# Patient Record
Sex: Male | Born: 1935 | Race: White | Hispanic: No | Marital: Married | Smoking: Never smoker
Health system: Southern US, Community
[De-identification: ages and names within clinical notes are randomized; demographics above are authoritative.]

## PROBLEM LIST (undated history)

## (undated) DIAGNOSIS — E785 Hyperlipidemia, unspecified: Secondary | ICD-10-CM

## (undated) DIAGNOSIS — Z951 Presence of aortocoronary bypass graft: Secondary | ICD-10-CM

## (undated) DIAGNOSIS — M1990 Unspecified osteoarthritis, unspecified site: Secondary | ICD-10-CM

## (undated) DIAGNOSIS — I491 Atrial premature depolarization: Secondary | ICD-10-CM

## (undated) DIAGNOSIS — R011 Cardiac murmur, unspecified: Secondary | ICD-10-CM

## (undated) DIAGNOSIS — I4819 Other persistent atrial fibrillation: Secondary | ICD-10-CM

## (undated) DIAGNOSIS — N529 Male erectile dysfunction, unspecified: Secondary | ICD-10-CM

## (undated) DIAGNOSIS — I10 Essential (primary) hypertension: Secondary | ICD-10-CM

## (undated) DIAGNOSIS — N400 Enlarged prostate without lower urinary tract symptoms: Secondary | ICD-10-CM

## (undated) DIAGNOSIS — Z952 Presence of prosthetic heart valve: Secondary | ICD-10-CM

## (undated) DIAGNOSIS — I2510 Atherosclerotic heart disease of native coronary artery without angina pectoris: Secondary | ICD-10-CM

## (undated) DIAGNOSIS — I350 Nonrheumatic aortic (valve) stenosis: Secondary | ICD-10-CM

## (undated) HISTORY — DX: Presence of aortocoronary bypass graft: Z95.1

## (undated) HISTORY — DX: Atrial premature depolarization: I49.1

## (undated) HISTORY — DX: Unspecified osteoarthritis, unspecified site: M19.90

## (undated) HISTORY — DX: Hyperlipidemia, unspecified: E78.5

## (undated) HISTORY — DX: Male erectile dysfunction, unspecified: N52.9

## (undated) HISTORY — PX: TONSILLECTOMY AND ADENOIDECTOMY: SUR1326

## (undated) HISTORY — DX: Essential (primary) hypertension: I10

---

## 1998-11-19 HISTORY — PX: COLONOSCOPY: SHX174

## 2003-11-05 ENCOUNTER — Encounter: Admission: RE | Admit: 2003-11-05 | Discharge: 2003-11-05 | Payer: Self-pay | Admitting: *Deleted

## 2003-12-17 ENCOUNTER — Encounter: Admission: RE | Admit: 2003-12-17 | Discharge: 2003-12-17 | Payer: Self-pay | Admitting: Cardiology

## 2004-01-26 ENCOUNTER — Encounter
Admission: RE | Admit: 2004-01-26 | Discharge: 2004-01-26 | Payer: Self-pay | Admitting: Physical Medicine and Rehabilitation

## 2004-11-03 HISTORY — PX: CARDIOVASCULAR STRESS TEST: SHX262

## 2008-04-19 HISTORY — PX: CORONARY ARTERY BYPASS GRAFT: SHX141

## 2008-05-04 ENCOUNTER — Encounter: Payer: Self-pay | Admitting: Cardiothoracic Surgery

## 2008-05-04 ENCOUNTER — Inpatient Hospital Stay (HOSPITAL_COMMUNITY): Admission: RE | Admit: 2008-05-04 | Discharge: 2008-05-12 | Payer: Self-pay | Admitting: *Deleted

## 2008-05-11 ENCOUNTER — Ambulatory Visit: Payer: Self-pay | Admitting: Cardiothoracic Surgery

## 2008-05-12 DIAGNOSIS — Z951 Presence of aortocoronary bypass graft: Secondary | ICD-10-CM

## 2008-05-12 HISTORY — DX: Presence of aortocoronary bypass graft: Z95.1

## 2008-05-27 ENCOUNTER — Encounter (HOSPITAL_COMMUNITY): Admission: RE | Admit: 2008-05-27 | Discharge: 2008-08-25 | Payer: Self-pay | Admitting: Cardiothoracic Surgery

## 2008-06-03 ENCOUNTER — Encounter: Admission: RE | Admit: 2008-06-03 | Discharge: 2008-06-03 | Payer: Self-pay | Admitting: Cardiothoracic Surgery

## 2008-06-03 ENCOUNTER — Ambulatory Visit: Payer: Self-pay | Admitting: Cardiothoracic Surgery

## 2008-08-26 ENCOUNTER — Encounter (HOSPITAL_COMMUNITY): Admission: RE | Admit: 2008-08-26 | Discharge: 2008-10-28 | Payer: Self-pay | Admitting: Cardiology

## 2008-09-23 ENCOUNTER — Encounter: Admission: RE | Admit: 2008-09-23 | Discharge: 2008-09-23 | Payer: Self-pay | Admitting: Cardiology

## 2008-12-02 IMAGING — CR DG CHEST 2V
2 series · 2 of 2 positions shown · non-contrast
Comparison: 05/10/2008

CLINICAL DATA: Post CABG

CHEST - 2 VIEW

[view not recorded (1 of 2)]
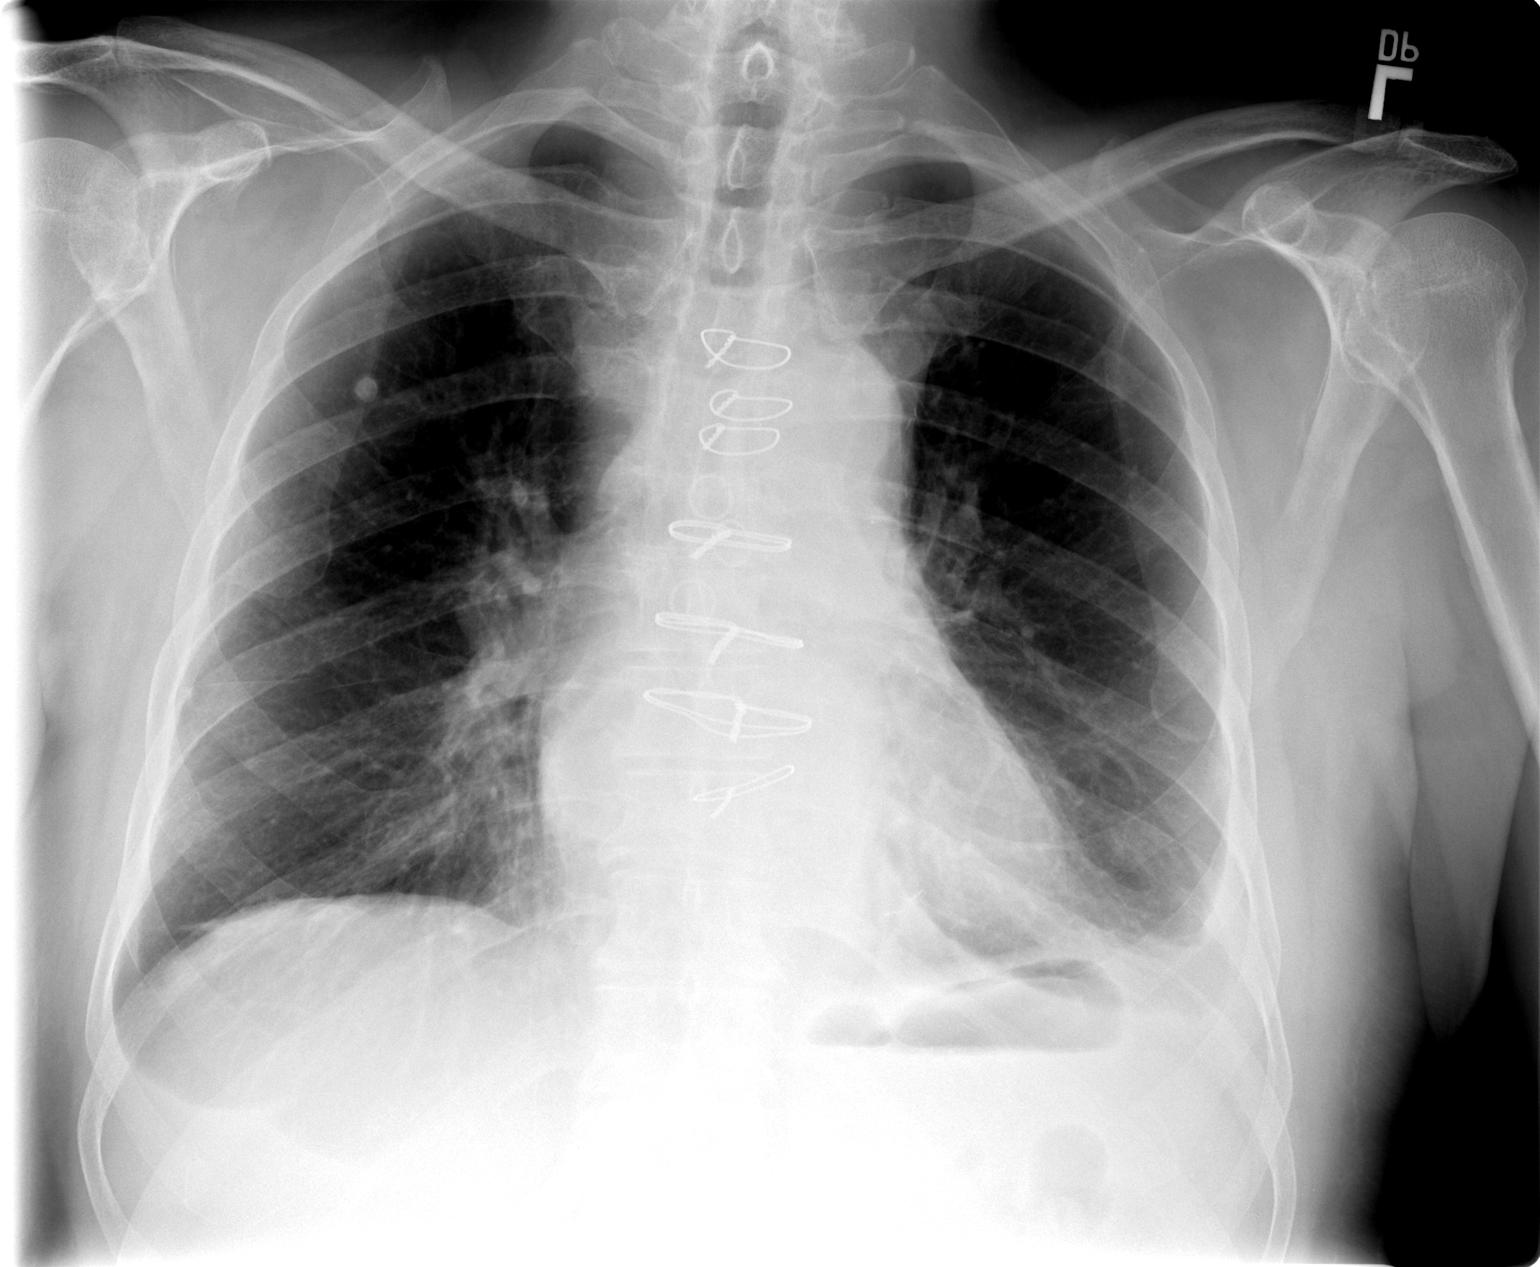

[view not recorded (2 of 2)]
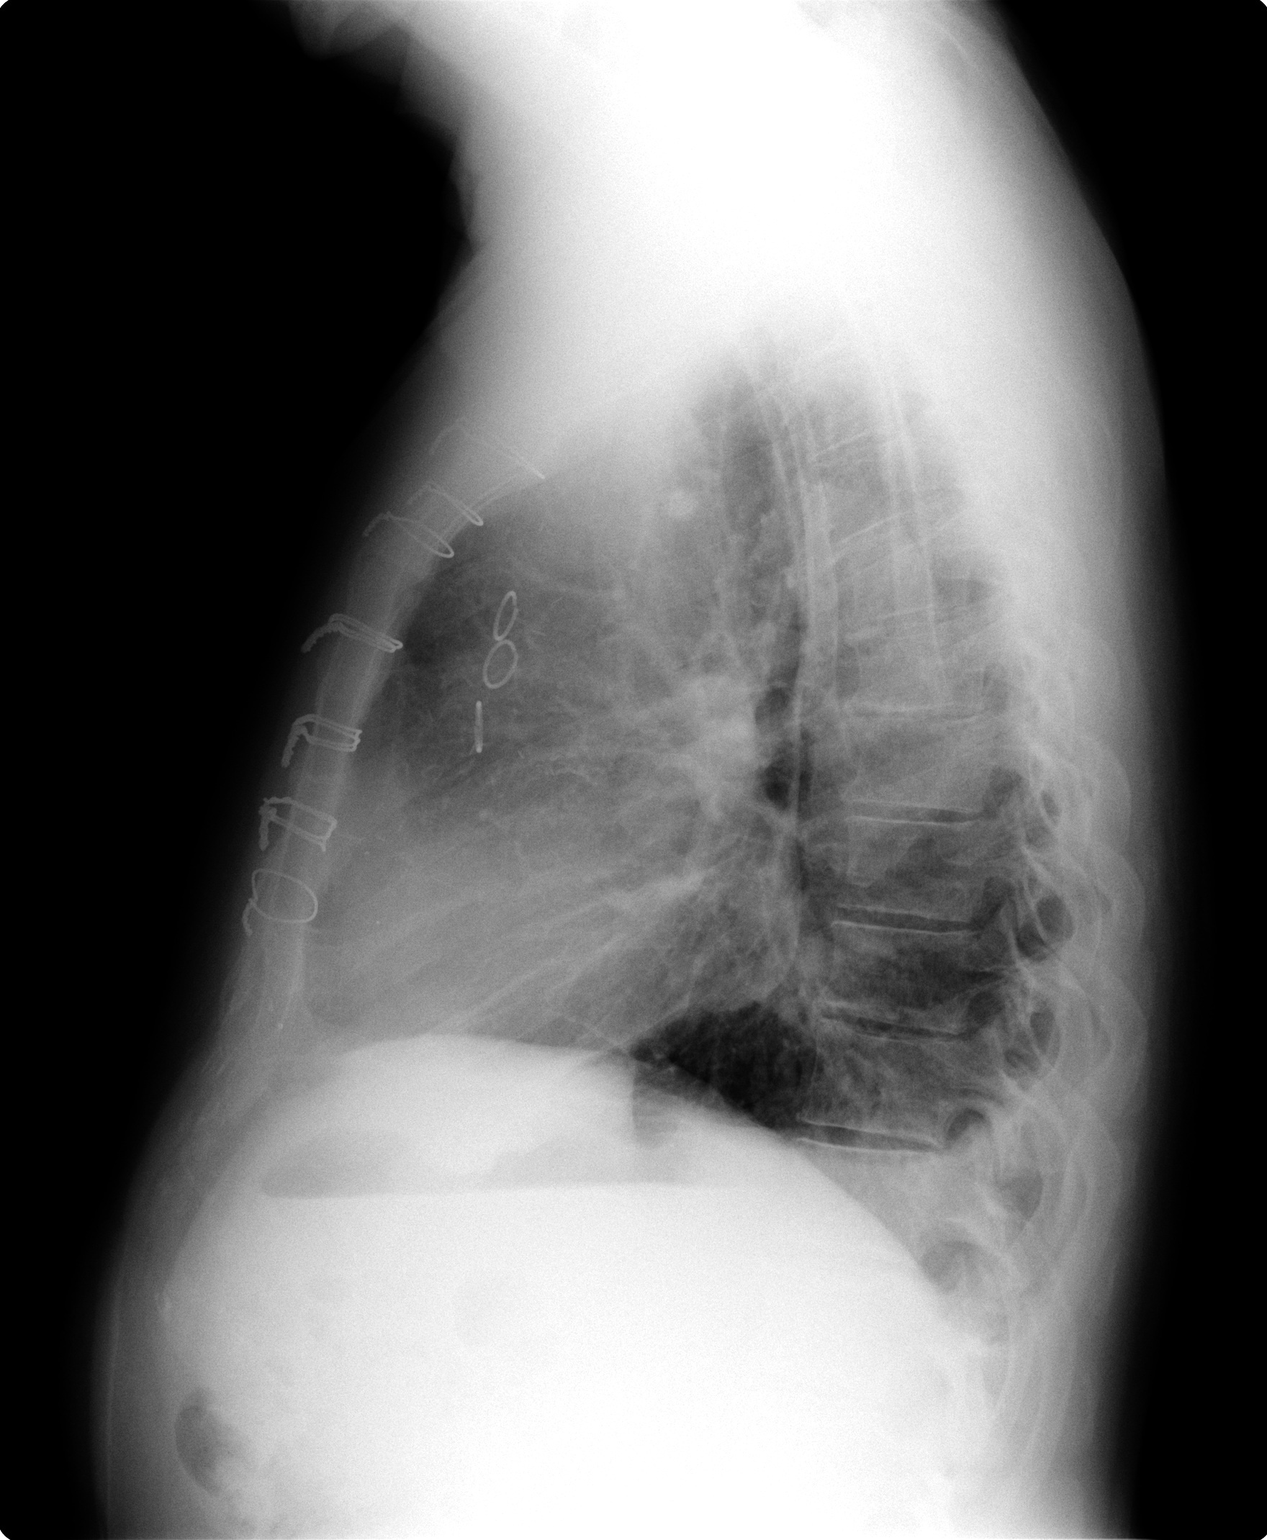

[2 of 2 positions shown; findings below may reference images not displayed]

FINDINGS: Heart smaller with no current vascular congestion.  The
right pleural effusion has essentially resolved.  The left pleural
effusion is smaller and there is less atelectasis at the left lung
base.

Stable right upper lobe calcified granuloma.  Osseous structures
intact.
IMPRESSION: 1.  Decreased pleural effusions with still some residual left
effusion and minimal residual left lower lobe atelectasis.
2.  Overall, significant improvement since the last exam.
3.  No new findings.

## 2010-06-21 ENCOUNTER — Ambulatory Visit: Payer: Self-pay | Admitting: Cardiology

## 2010-06-26 ENCOUNTER — Ambulatory Visit: Payer: Self-pay | Admitting: Cardiology

## 2010-10-20 ENCOUNTER — Ambulatory Visit: Payer: Self-pay | Admitting: Cardiology

## 2010-10-23 ENCOUNTER — Ambulatory Visit: Payer: Self-pay | Admitting: Cardiology

## 2010-12-09 ENCOUNTER — Encounter: Payer: Self-pay | Admitting: Critical Care Medicine

## 2011-04-03 NOTE — H&P (Signed)
Thomas Diaz, Thomas Diaz               ACCOUNT NO.:  [id number]   MEDICAL RECORD NO.:  [id number]            PATIENT TYPE:   LOCATION:                                 FACILITY:   PHYSICIAN:  Elmore Guise., M.D.DATE OF BIRTH:  1932-09-21   DATE OF ADMISSION:  DATE OF DISCHARGE:                              HISTORY & PHYSICAL   PRE-CATH NOTE:   DATE OF CATH:  May 04, 2008.   INDICATION FOR CATH:  Increasing exertional chest pain.   PRIMARY CARDIOLOGIST:  Dr. Ronny Flurry.   HISTORY OF PRESENT ILLNESS:  Mr. Thomas Diaz is a 77 year old white male with  a past medical history of hypertension, dyslipidemia, osteoarthritis,  erectile dysfunction who presents for evaluation of increasing chest  pain, fatigue and shortness of breath.  The patient was initially seen  back on Apr 15, 2008 after he got back from traveling.  At that time, he  reported that he was having increased fatigue.  Typically he was walking  3 miles daily, but doing fairly well.  He awoke that day with some chest  tightness and pressure.  He came in for evaluation.  At that time, we  recommended a stress test.  He deferred his stress test at that time and  wanted to see if his symptoms would just go away on their own.  However,  his symptoms now have progressed.  He now has increasing symptoms on  walking up stairs or a hill.  He describes this as substernal chest  pressure with shortness of breath and some nausea.  He stated he went to  walk with his friends the other day.  He became very diaphoretic and  started having chest pain and shortness of breath.  He had to sit down  before his symptoms would improve.  He did not have to take any  nitroglycerin.  Other than chest pain, he feels pretty good.  He does  have fatigue but no orthopnea, PND.  He does have occasional episodes of  presyncope.  This can happen with or without his chest pain.  He has had  no recent fever, chills, nausea, vomiting or diarrhea and no  claudication symptoms.   CURRENT MEDICATIONS:  1. Lopressor 50 mg in the morning 25 at night.  2. Aspirin 325 mg daily.  3. Glucosamine once daily.  4. Valium 5 mg p.r.n.  5. Cialis p.r.n.  6. Diovan 80 mg daily.  7. Nitroglycerin p.r.n.   ALLERGIES:  None.   FAMILY HISTORY:  Positive for hypertension and heart disease.   SOCIAL HISTORY:  He is married.  He denies any tobacco use.   EXAM:  His weight is 183.  His blood pressure is 140/80.  His heart rate  72 and regular.  GENERAL:  He is a very pleasant white male alert and oriented x4.  No  acute distress.  HEENT is normal.  NECK:  Supple.  No lymphadenopathy.  2+ carotids.  No JVD and no bruits.  LUNGS:  Clear.  Heart is regular with normal S1-S2.  ABDOMEN:  Soft, nontender.  Nondistended.  No rebound or guarding.  EXTREMITIES:  Warm with 2+ pulses and no edema.  Right femoral area has  2+ pulse.  No bruit noted.   His last ECG done Apr 15, 2008 shows normal sinus rhythm rate of 69 per  minute with ST depression noted in his inferior leads.  His blood work  shows a BUN and creatinine of 16 and 1.0, potassium level 4.3.  He has  normal LFTs.  His PT/INR and CBC are still pending at time of dictation.  His chest x-ray shows old granulomatous disease in the right upper lobe  unchanged from his prior x-rays.   IMPRESSION:  1. Increasing exertional angina.  2. Fatigue.  3. History of dyslipidemia.   PLAN:  The patient has had increasing symptoms over the last 3 weeks.  I  would recommend giving a stress test and proceeding directly with heart  catheterization.  I have discussed the risks and benefits of this  procedure with him at length.  His procedure will be scheduled for  tomorrow morning.  All his questions were answered.      Elmore Guise., M.D.  Electronically Signed     TWK/MEDQ  D:  05/03/2008  T:  05/03/2008  Job:  [id number]

## 2011-04-03 NOTE — Assessment & Plan Note (Signed)
OFFICE VISIT   Thomas Diaz, Thomas Diaz  DOB:  20-Mar-1932                                        June 03, 2008  CHART #:  [id number]   The patient returns to the office today in followup after his coronary  artery bypass grafting x5 done on 05/07/2008.  Postoperatively, he  appears to be making good progress.  He does complain of fatigue and  decreased energy levels.  He has had some numbness and discomfort over  the left breast and left nipple, probably related to the dysesthesias  from the use of the left internal mammary artery.  He has had no  recurrent angina or evidence of congestive heart failure.  On exam, his  blood pressure 114/71, pulse 81, respiratory rate is 18, and O2 sats  94%.   Sheliah Plane, MD  Electronically Signed   EG/MEDQ  D:  06/03/2008  T:  06/04/2008  Job:  [id number]   cc:   Cassell Clement, M.D.

## 2011-04-03 NOTE — Op Note (Signed)
Thomas Diaz, Thomas Diaz               ACCOUNT NO.:  [id number]   MEDICAL RECORD NO.:  [id number]          PATIENT TYPE:  INP   LOCATION:  2015                         FACILITY:  [facility]   PHYSICIAN:  Thomas Plane, MD    DATE OF BIRTH:  12-11-1931   DATE OF PROCEDURE:  05/07/2008  DATE OF DISCHARGE:  05/12/2008                               OPERATIVE REPORT   PREOPERATIVE DIAGNOSIS:  Unstable angina.   POSTOPERATIVE DIAGNOSIS:  Unstable angina.   SURGICAL PROCEDURES:  1. Coronary artery bypass grafting x5 with left internal mammary to      the left anterior descending coronary artery.  2. Reverse saphenous vein graft to the diagonal coronary artery.  3. Sequential reverse saphenous vein graft to the intermediate and      first obtuse marginal.  4. Reverse saphenous vein graft to the distal right coronary artery      with right leg thigh and calf endovein harvesting.   SURGEON:  Thomas Plane, MD   FIRST ASSISTANT:  Thomas Diaz. [name], PA   BRIEF HISTORY:  The patient is a 77 year old male who presented with  unstable anginal symptoms progressing over several weeks prior to  admission.  Cardiac catheterization was performed and coronary artery  bypass grafting was recommended.  The patient agreed and signed informed  consent.   DESCRIPTION OF PROCEDURE:  Swan-Ganz and arterial line monitors in  place.  The patient underwent general endotracheal anesthesia without  incidence.  Skin of the chest and legs were prepped with Betadine and  draped in usual sterile manner.  Using the [employer] endovein harvesting  system, vein was harvested from the right thigh and calf and was of good  quality and caliber.  Median sternotomy was performed, left internal  mammary artery was dissected down as pedicle graft.  The distal artery  was divided, had good free flow.  Pericardium was opened.  Overall  ventricular function appeared preserved.  The patient was systemically  heparinized.  The  ascending aorta was cannulated.  The right atrium was  cannulated and aortic root vent cardioplegia needle was introduced into  the ascending aorta.  The patient was placed on cardiopulmonary bypass  2.4 L/min/m2.  Sites of anastomosis were selected and dissected out of  the epicardium.  The patient's body temperature was cooled to 30  degrees.  Aortic crossclamp was applied and 500 mL of cold blood  potassium cardioplegia was administered with rapid diastolic arrest of  the heart.  Myocardial septal temperature was monitored throughout the  crossclamp.  Attention was turned first to the distal right coronary  artery, which was opened and admitted 1.5-mm probe.  By using a running  7-0 Prolene, distal anastomosis was performed.  Attention was then  turned to the intermediate and first obtuse marginal.  The intermediate  was opened 1.4-1.5 mm in size.  Side-to-side anastomosis was performed  with a running 7-0 Prolene.  The distal extent of the same vein was then  carried to the first obtuse marginal vessel.  The very distal circumflex  was high on AV groove and small.  The  obtuse marginal was open measuring  1.5-mm probe.  Using running 7-0 Prolene, distal anastomosis was  performed.  Attention was then turned to the diagonal coronary artery,  which was opened and was 1.3-1.4 mm in size.  Using a running 7-0  Prolene, distal anastomosis was performed with a second reverse  saphenous vein graft.  Between the mid and distal third of the left  anterior descending coronary artery, the vessel was opened and was of  good quality and caliber.  Using a running 8-0 Prolene, the left  internal mammary artery was anastomosed to the left anterior descending  coronary artery with release of the bulldog on the mammary artery.  There was rise in myocardial septal temperature.  An additional bulldog  was placed back on the mammary artery.  With crossclamp still in place,  three punch aortotomies were  performed.  Each of 3 vein grafts were  anastomosed to the ascending aorta.  Air was evacuated from the grafts  and ascending aorta and aortic crossclamp was removed with total  crossclamp time of 89 minutes.  The patient spontaneously converted to a  sinus rhythm.  Sites of anastomosis were inspected and were free of  bleeding.  Using atrial and ventricular pacing, wires were placed.  He  was then ventilated and weaned from cardiopulmonary bypass without  difficulty, remained hemodynamically stable.  He was decannulated in the  usual fashion. Protamine sulfate was administered within operative  field. Hemostatic two atrial, two ventricular pacing wires were applied.  Graft markers applied. The left pleural tube and a Blake mediastinal  drain was left in place.  Pericardium was reapproximated.  Sternum was  closed with #6 stainless steel wire.  Fascia was closed with interrupted  0 Vicryl, running 3-0 Vicryl, subcutaneous tissue, 4-0 subcuticular  stitch in skin edges.  Dry dressings were applied.  Sponge and needle  count was reported as correct at the completion of procedure.  The  patient tolerated procedure without obvious complication.  Total protime  was 112 minutes.      Thomas Plane, MD  Electronically Signed     EG/MEDQ  D:  05/17/2008  T:  05/17/2008  Job:  [id number]   cc:   Thomas Diaz, M.D.

## 2011-04-03 NOTE — Discharge Summary (Signed)
Thomas Diaz, Thomas Diaz               ACCOUNT NO.:  [id number]   MEDICAL RECORD NO.:  [id number]          PATIENT TYPE:  INP   LOCATION:  2015                         FACILITY:  [facility]   PHYSICIAN:  Sheliah Plane, MD    DATE OF BIRTH:  03-03-1932   DATE OF ADMISSION:  05/04/2008  DATE OF DISCHARGE:                               DISCHARGE SUMMARY   FINAL DIAGNOSIS:  Three-vessel coronary artery disease.   IN-HOSPITAL DIAGNOSES:  1. Acute blood-loss anemia postoperatively.  2. Postoperative confusion.  3. Volume overload.   SECONDARY DIAGNOSES:  1. Hypertension.  2. Dyslipidemia.  3. Osteoarthritis.  4. Erectile dysfunction.   IN-HOSPITAL OPERATIONS AND PROCEDURES:  1. Cardiac catheterization.  2. Coronary artery bypass grafting x 5 using a left internal mammary      artery to left anterior descending, saphenous vein graft to right      coronary artery, saphenous vein graft sequential to intermediate      and obtuse marginal, and saphenous vein graft to diagonal.   HISTORY AND PHYSICAL AND HOSPITAL COURSE:  The patient is a 77 year old  male who has no previous cardiac history.  He notes a normal stress test  4 years ago.  Two months ago, while visiting his family, he noted  increasing fatigue and occasional dizziness without syncope.  Over the  past 2 months, he become to have progressively decrease in levels of  activity.  The patient was seen by Dr. Patty Sermons and underwent cardiac  catheterization.  This showed three-vessel coronary artery disease.  Following catheterization, Dr. Tyrone Sage was consulted.  Dr. Tyrone Sage saw  and evaluated the patient.  He discussed with the patient undergoing  coronary bypass grafting.  Risks and benefits discussed.  The patient  acknowledged understanding and agreed to proceed.  Preoperatively, the  patient went bilateral carotid duplex ultrasound showing no significant  ICA stenosis.  The patient remained stable preoperatively.  He was  scheduled for surgery for May 07, 2008.  For details of the patient's  past medical history and physical exam, please see dictated H&P.   The patient was taken to the operating room on May 07, 2008, where he  underwent coronary artery bypass grafting x5 using a left internal  mammary artery to left anterior descending, saphenous vein graft to  right coronary artery, saphenous vein graft to sequential to  intermediate branch and obtuse marginal, and saphenous vein graft to  diagonal artery.  The patient tolerated this procedure and was  transferred to the intensive care unit in a stable condition.  Postoperatively, the patient was noted to be hemodynamically stable.  He  was extubated evening of surgery.  Postextubation, he was noted to be  alert and oriented x4.  Neuro intact.  Postoperatively, the patient did  require neo and dopamine.  These were able to be weaned and  discontinued; however, his blood pressure remained stable.  The patient  remained in normal sinus rhythm postoperatively.  He was restarted on  Lopressor.  The patient's blood pressure monitoring remained stable.  External pacing wires were discontinued in normal fashion.  Chest x-ray  obtained postoperatively was clear.  Chest tubes were discontinued in  normal fashion.  The patient was encouraged to use his incentive  spirometer and able to be weaned off oxygen, satting greater than 90% on  room air.  While the patient was in the intensive care unit, he did  develop mild confusion.  All narcotics were discontinued.  The patient  noted to be on chronic Valium as needed at home.  He was monitored, and  confusion did improve, and he was transferred out to the PCTU alert and  oriented x4 and in a stable condition.  Pain was maintained on Ultram  p.o.  The patient did have mild acute blood-loss anemia.  Hemoglobin/hematocrit dropping to 8.6 and 24.9% on postop day #3.  He  was asymptomatic and monitored.  We will recheck  his CBC in the a.m. on  May 12, 2008.  He was started on p.o. iron.  The patient also had mild  volume overload postoperatively.  He was started on diuretics.  Daily  weights obtained.  The patient is currently 6.4 kg above his  preoperative weight.  Plan to continue diuretics as an outpatient.  The  patient was out of bed ambulating well with cardiac rehab.  He was  tolerating diet well.  No nausea or vomiting noted.  All incisions were  clean, dry, and intact and healing well.   On postop day #4, the patient was noted to be afebrile.  He was in  normal sinus rhythm.  Blood pressure stable.  Sats greater than 90% on  room air.  BMP done postop day #4 showed a sodium of 142, potassium 3.5,  chloride of 108, bicarb of 28, BUN 14, creatinine of 0.98, and glucose  of 119.  The patient had CBC done a day prior showing white count 11.7,  hemoglobin of 9.8, hematocrit 24.9, and glucose of 94.  I will check his  CBC in the a.m. prior to discharge to home.  The patient is tentatively  ready for discharge home on postop day #5, May 12, 2008.  This  depending he remains stable.   FOLLOWUP APPOINTMENT:  Followup appointment has been arranged with Dr.  Tyrone Sage for June 03, 2008, at 10:30 a.m..  The patient will need to  obtain PA and lateral chest x-ray 30 minutes prior to this appointment.  The patient will need to follow up Dr. Patty Sermons in 2 weeks.  He will  need to contact Dr. Yevonne Pax office to make these arrangements.   ACTIVITY:  Patient instructed no driving, he agrees to do so, no lifting  over 10 pounds.  He is told to ambulate 3/4 times per day and progress  as tolerated and continue his breathing exercises.   INCISIONAL CARE:  The patient was told to shower washing his incisions  using soap and water.  He is to contact the office if he develops any  drainage or opening from any of his incision sites.   DISCHARGE MEDICATIONS:  1. Lopressor 50 mg in the a.m.  2. Lopressor 25 mg  in the p.m.  3. Aspirin 325 mg daily.  4,  Valium 5 mg p.r.n.  5,  Crestor 20 mg at night.  1. Lasix 40 mg daily x5 days.  2. Potassium chloride 20 mEq daily x5 days.  3. Niferex 150 mg daily.  4. Multivitamin daily.  5. Folic acid 1 mg daily.  6. Ultram 50 mg 1-2 tablets q.4-6 h. p.r.n.  Theda Belfast, [place]      Sheliah Plane, MD  Electronically Signed    KMD/MEDQ  D:  05/11/2008  T:  05/12/2008  Job:  [id number]   cc:   Cassell Clement, M.D.

## 2011-04-03 NOTE — Assessment & Plan Note (Signed)
OFFICE VISIT   Thomas Diaz, Thomas Diaz  DOB:  25-Dec-1931                                        June 03, 2008  CHART #:  [id number]   The patient returns to the office today in followup after his coronary  artery bypass grafting x5 done on 05/07/2008.  He seems to be making  good progress postoperatively.  He does complain of fatigue and left  chest tenderness and hypoesthesias probably related to use of the left  internal mammary artery.  He has had no recurrent angina or evidence of  congestive heart failure.   On exam, his blood pressure is 114/71, pulse is 81, respiratory rate is  18, and O2 sats 94%.  His sternum is stable and well healed.  His lungs  are clear bilaterally.  His right endovein harvest site is also healing  well.  He has no pedal edema.   Followup chest x-ray shows clear lung fields bilaterally.   Overall, he seems to be making very good progress postoperatively.  I  have encouraged him to continue with the cardiac rehab program which he  is to start tomorrow.  I will plan to see him back p.r.n. at his or Dr.  Yevonne Pax request any time.   Sheliah Plane, MD  Electronically Signed   EG/MEDQ  D:  06/03/2008  T:  06/04/2008  Job:  [id number]   cc:   Cassell Clement, M.D.

## 2011-04-03 NOTE — Consult Note (Signed)
Thomas Diaz, Thomas Diaz NO.:  [id number]   MEDICAL RECORD NO.:  [id number]          PATIENT TYPE:  OIB   LOCATION:  3743                         FACILITY:  [facility]   PHYSICIAN:  Sheliah Plane, MD    DATE OF BIRTH:  04/15/1932   DATE OF CONSULTATION:  05/04/2008  DATE OF DISCHARGE:                                 CONSULTATION   REQUESTING PHYSICIAN:  Rosine Abe, M.D.   FOLLOWUP CARDIOLOGIST:  Cassell Clement, M.D.   REASON FOR CONSULTATION:  Unstable angina with coronary occlusive  disease.   HISTORY OF PRESENT ILLNESS:  The patient is a 77 year old male who has  no previous cardiac history.  He notes a normal stress test 4 years ago.  Two months ago while visiting his family in [place] state, he noted  increasing fatigue and occasional dizziness, but without syncope.  Over  the past 2 months, the symptoms have become progressive with decreasing  levels of activity.  He noted last week increasing substernal discomfort  with shortness of breath with pain radiating to his gums.  Initially, it  took about 2 miles of walking to do this until late last week when it  occurred with just walking 1 block.  He saw Dr. Patty Sermons, and cardiac  catheterization was recommend and done as an outpatient today.  The  patient has had no previous history of myocardial infarction or  angioplasty.  Cardiac risk factors include hypertension, hyperlipidemia,  and family history.  He has had no previous stroke, no claudication, and  no renal insufficiency.   PAST MEDICAL HISTORY:  1. Hypertension, treated.  2. Hyperlipidemia.  3. Erectile dysfunction.   SOCIAL AND FAMILY HISTORY:  The patient is married.  He has been retired  from [employer] x 17 years.  Very rare alcohol use.  The patient's  father died at age 52 suddenly in his sleep, unknown cause.  Mother died  at age 60 with hypertension, one brother recently died at age 64 with  myocardial infarction.   CURRENT  MEDICATIONS:  1. Lopressor 50 mg q.a.m. and 25 q.p.m.  2. Aspirin 325 mg a day.  3. Cialis.  4. Diovan 80 mg.  5. Glucosamine.  6. Nitroglycerin p.r.n.  7. Lipitor was added today.   DRUG ALLERGIES:  None known, though the patient noted several years ago,  he took Lipitor for 3 months and became fatigued and discontinued it.   REVIEW OF SYSTEMS:  GENERAL:  The patient notes increasing fatigue.  Denies fever, chills, or night sweats.  CARDIAC:  Positive for  exertional chest pain, exertional shortness of breath, rare palpitations  in the past, none currently, and presyncope.  Denies syncope, orthopnea,  or lower extremity edema.  RESPIRATORY:  The patient denies any  hemoptysis.  He does have exertional shortness of breath.  GASTROINTESTINAL:  No change in bowel habits, no blood in stool, noted a  colonoscopy about 3 years ago.  MUSCULOSKELETAL:  He has had knee  problems in the past, but that has not interfered with his ambulation.  He does have nocturia x1.  No hematuria.  Denies psychiatric history.   PHYSICAL EXAMINATION:  VITAL SIGNS:  Today, his blood pressure is  152/95, heart rate 63, respiratory rate 20, and temperature 97.4.  He is  on room air 100%.  He is 5 feet 10 inches tall and 178 pounds.  GENERAL:  The patient is awake, alert and neurologically intact.  NECK:  He has no carotid bruits.  LUNGS:  Clear bilaterally.  CARDIAC:  Regular rate and rhythm without murmur or gallop.  ABDOMEN:  Without palpable masses or tenderness.  LOWER EXTREMITIES:  Without any edema.  He has got 2+ DP and PT pulses  bilaterally.   LABORATORY FINDINGS:  Include a white count of 7.1, hematocrit of 47,  platelet count 197, sodium 144, potassium 4.4, and creatinine 1.0.  Cardiac catheterization, ejection fraction 60%.  He has got 60% distal  left main extended into ostium of LAD, a small diagonal branch with an  80% stenosis, luminal irregularities in the circumflex, the right  coronary  artery is a small vessel with proximal to mid 95% stenosis.   IMPRESSION:  The patient with unstable anginal symptoms without history  of myocardial infarction who presents now with probably symptomatic  right coronary lesion with left anterior descending and left main  obstructions of 60%.  The risks and options were discussed with the  patient with the right coronary artery could undergo angioplasty;  however, this still leaves the patient with a left main obstruction of  at least 60% involving the left anterior descending.  He is otherwise in  good health and coronary artery bypass grafting would be the best  solution for relief of symptoms and preservation of left ventricular  function.  The risks of procedure including death, infection, stroke,  myocardial infarction, bleeding, and blood transfusion were all  discussed with the patient in detail and he is willing to agree.  He has  had a specific request for surgeon, current schedule allows, we could  proceed with surgery on Friday, May 07, 2008.      Sheliah Plane, MD  Electronically Signed     EG/MEDQ  D:  05/04/2008  T:  05/05/2008  Job:  [id number]   cc:   Cassell Clement, M.D.

## 2011-05-21 ENCOUNTER — Encounter: Payer: Self-pay | Admitting: Cardiology

## 2011-05-25 ENCOUNTER — Other Ambulatory Visit: Payer: Self-pay | Admitting: *Deleted

## 2011-05-25 DIAGNOSIS — E785 Hyperlipidemia, unspecified: Secondary | ICD-10-CM

## 2011-05-28 ENCOUNTER — Other Ambulatory Visit (INDEPENDENT_AMBULATORY_CARE_PROVIDER_SITE_OTHER): Payer: Medicare Other | Admitting: *Deleted

## 2011-05-28 DIAGNOSIS — M109 Gout, unspecified: Secondary | ICD-10-CM

## 2011-05-28 DIAGNOSIS — E785 Hyperlipidemia, unspecified: Secondary | ICD-10-CM

## 2011-05-28 LAB — BASIC METABOLIC PANEL
BUN: 16 mg/dL (ref 6–23)
Calcium: 8.9 mg/dL (ref 8.4–10.5)
GFR: 65.49 mL/min (ref >60.00)
Glucose, Bld: 105 mg/dL — ABNORMAL HIGH (ref 70–99)
Potassium: 4.1 mEq/L (ref 3.5–5.1)

## 2011-05-28 LAB — HEPATIC FUNCTION PANEL
AST: 24 U/L (ref 0–37)
Albumin: 4.4 g/dL (ref 3.5–5.2)

## 2011-05-28 LAB — LIPID PANEL
Cholesterol: 151 mg/dL (ref 0–200)
VLDL: 24.2 mg/dL (ref 0.0–40.0)

## 2011-05-30 ENCOUNTER — Encounter: Payer: Self-pay | Admitting: Cardiology

## 2011-05-30 ENCOUNTER — Ambulatory Visit (INDEPENDENT_AMBULATORY_CARE_PROVIDER_SITE_OTHER): Payer: Medicare Other | Admitting: Cardiology

## 2011-05-30 DIAGNOSIS — Z9889 Other specified postprocedural states: Secondary | ICD-10-CM

## 2011-05-30 DIAGNOSIS — I119 Hypertensive heart disease without heart failure: Secondary | ICD-10-CM | POA: Insufficient documentation

## 2011-05-30 DIAGNOSIS — Z951 Presence of aortocoronary bypass graft: Secondary | ICD-10-CM

## 2011-05-30 DIAGNOSIS — N400 Enlarged prostate without lower urinary tract symptoms: Secondary | ICD-10-CM

## 2011-05-30 DIAGNOSIS — M109 Gout, unspecified: Secondary | ICD-10-CM

## 2011-05-30 DIAGNOSIS — N529 Male erectile dysfunction, unspecified: Secondary | ICD-10-CM

## 2011-05-30 HISTORY — DX: Benign prostatic hyperplasia without lower urinary tract symptoms: N40.0

## 2011-05-30 MED ORDER — ALLOPURINOL 100 MG PO TABS: 100.0000 mg | ORAL_TABLET | Freq: Every day | ORAL | Status: DC

## 2011-05-30 MED ORDER — ASPIRIN EC 81 MG PO TBEC: 81.0000 mg | DELAYED_RELEASE_TABLET | Freq: Every day | ORAL | Status: AC

## 2011-05-30 MED ORDER — TADALAFIL 20 MG PO TABS: 20.0000 mg | ORAL_TABLET | Freq: Every day | ORAL | Status: DC | PRN

## 2011-05-30 MED ORDER — SILODOSIN 8 MG PO CAPS: 8.0000 mg | ORAL_CAPSULE | Freq: Every day | ORAL | Status: DC

## 2011-05-30 NOTE — Assessment & Plan Note (Signed)
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  He is concerned that he will have an attack of gout while he is on a long ship cruise.His episodes of acute gout are very infrequent.  His recent uric acid is upper limit at 7.8.  We will have him take allopurinol starting a month before his cruise and during the cruise to help make sure that he doesn't have an acute attack during the cruise.  We will also reduce his aspirin from 325 mg to 81 mg and we reminded him about drinking plenty of water

## 2011-05-30 NOTE — Assessment & Plan Note (Signed)
Patient has a history of essential hypertension.  He has not been having any headaches or dizzy spells or other cardiac symptoms referable to his blood pressure.

## 2011-05-30 NOTE — Progress Notes (Signed)
Thomas Diaz Date of Birth:  05-Aug-1932 [facility] [address]   [address] [place], [place]  [address] [phone]           Fax   [phone]  HPI: This pleasant 77 year old gentleman is seen for a scheduled six-month followup office visit.  He has been feeling well from a cardiac standpoint.  He has a past history of ischemic heart disease and underwent coronary artery bypass graft surgery in June of 2009.  Since then he has done well with no recurrent chest discomfort.  He walks daily for exercise.  He has a history of essential hypertension.  He's also had mild hypercholesterolemia and he is on statin therapy.  He's had some urinary issues and has seen Dr. Shiela Mayer who put him on rapaflo which has improved his situation.  He has a history of erectile dysfunction and is on p.r.n. Cialis 20 mg tablet.  He's had several flareups of gout.  His most recent uric acid level is 7.8  Current Outpatient Prescriptions  Medication Sig Dispense Refill  . allopurinol (ZYLOPRIM) 100 MG tablet Take 1 tablet (100 mg total) by mouth daily.  30 tablet  2  . diazepam (VALIUM) 5 MG tablet Take 5 mg by mouth every 6 (six) hours as needed.        . metoprolol (LOPRESSOR) 50 MG tablet Take 50 mg by mouth 2 (two) times daily.        . Multiple Vitamin (MULTIVITAMIN) tablet Take 1 tablet by mouth daily.        . nitroGLYCERIN (NITROSTAT) 0.4 MG SL tablet Place 0.4 mg under the tongue every 5 (five) minutes as needed.        . rosuvastatin (CRESTOR) 5 MG tablet Take 5 mg by mouth every other day. Three times a week      . tadalafil (CIALIS) 20 MG tablet Take 1 tablet (20 mg total) by mouth daily as needed.  18 tablet  3  . aspirin EC 81 MG tablet Take 1 tablet (81 mg total) by mouth daily.  150 tablet  2  . silodosin (RAPAFLO) 8 MG CAPS capsule Take 1 capsule (8 mg total) by mouth daily with breakfast.  30 capsule  11    Allergies  Allergen Reactions  . Halcion (Triazolam)   .  Lipitor (Atorvastatin Calcium)   . Lisinopril Cough    Patient Active Problem List  Diagnoses  . Hx of CABG  . Gout  . BPH (benign prostatic hyperplasia)  . Erectile dysfunction  . Benign hypertensive heart disease without heart failure    History  Smoking status  . Never Smoker   Smokeless tobacco  . Not on file    History  Alcohol Use No    Family History  Problem Relation Age of Onset  . Heart attack Father   . Heart attack Brother     Review of Systems: The patient denies any heat or cold intolerance.  No weight gain or weight loss.  The patient denies headaches or blurry vision.  There is no cough or sputum production.  The patient denies dizziness.  There is no hematuria or hematochezia.  The patient denies any muscle aches or arthritis.  The patient denies any rash.  The patient denies frequent falling or instability.  There is no history of depression or anxiety.  All other systems were reviewed and are negative.   Physical Exam: Filed Vitals:   05/30/11 1013  BP:  132/80  Pulse: 64  The general appearance reveals a well-developed well-nourished gentleman in no distress.The head and neck exam reveals pupils equal and reactive.  Extraocular movements are full.  There is no scleral icterus.  The mouth and pharynx are normal.  The neck is supple.  The carotids reveal no bruits.  The jugular venous pressure is normal.  The  thyroid is not enlarged.  There is no lymphadenopathy.  The chest is clear to percussion and auscultation.  There are no rales or rhonchi.  Expansion of the chest is symmetrical.  The precordium is quiet.  The first heart sound is normal.  The second heart sound is physiologically split.  There is no murmur gallop rub or click.  There is no abnormal lift or heave.  The abdomen is soft and nontender.  The bowel sounds are normal.  The liver and spleen are not enlarged.  There are no abdominal masses.  There are no abdominal bruits.  Extremities reveal good  pedal pulses.  There is no phlebitis or edema.  There is no cyanosis or clubbing.  Strength is normal and symmetrical in all extremities.  There is no lateralizing weakness.  There are no sensory deficits.  The skin is warm and dry.  There is no rash.    Assessment / Plan: We are adding allopurinol 100 mg daily to be used before injuring his annual oceangoing cruise vacation.  He has a flareup of acute gout he will use ibuprofen or Aleve as necessary.  We will reduce his aspirin to 81 mg daily to decrease the likelihood of acute gout.  He is to force fluids especially water.  He'll return in 6 months for followup office visit and fasting lab work and uric acid

## 2011-05-30 NOTE — Assessment & Plan Note (Signed)
The patient has a past history of BPH.  He sees a urologist.  He has difficulty with erectile dysfunction and also a weak stream.  The weak stream has improved on Rapaflo.

## 2011-05-30 NOTE — Assessment & Plan Note (Signed)
The patient has a history of CABG in June 2009.  He's had no recurrent chest pain or angina.

## 2011-08-16 ENCOUNTER — Other Ambulatory Visit: Payer: Self-pay | Admitting: *Deleted

## 2011-08-16 DIAGNOSIS — F419 Anxiety disorder, unspecified: Secondary | ICD-10-CM

## 2011-08-16 LAB — BASIC METABOLIC PANEL
BUN: 14
BUN: 14
BUN: 15
BUN: 15
BUN: 18
BUN: 9
CO2: 24
CO2: 25
CO2: 25
CO2: 27
CO2: 27
CO2: 28
Calcium: 7.5 — ABNORMAL LOW
Calcium: 7.9 — ABNORMAL LOW
Calcium: 8.1 — ABNORMAL LOW
Calcium: 8.8
Chloride: 104
Chloride: 105
Chloride: 106
Chloride: 107
Chloride: 107
Chloride: 108
Chloride: 108
Creatinine, Ser: 0.94
Creatinine, Ser: 0.95
Creatinine, Ser: 0.98
Creatinine, Ser: 0.99
Creatinine, Ser: 1.01
Creatinine, Ser: 1.06
GFR calc Af Amer: >60
GFR calc Af Amer: >60
GFR calc Af Amer: >60
GFR calc Af Amer: >60
GFR calc Af Amer: >60
GFR calc non Af Amer: >60
GFR calc non Af Amer: >60
GFR calc non Af Amer: >60
GFR calc non Af Amer: >60
Glucose, Bld: 105 — ABNORMAL HIGH
Glucose, Bld: 118 — ABNORMAL HIGH
Glucose, Bld: 119 — ABNORMAL HIGH
Glucose, Bld: 175 — ABNORMAL HIGH
Glucose, Bld: 93
Potassium: 3.4 — ABNORMAL LOW
Potassium: 3.5
Potassium: 3.5
Potassium: 3.6
Potassium: 3.6
Potassium: 3.9
Potassium: 4.5
Sodium: 137
Sodium: 138
Sodium: 140
Sodium: 141
Sodium: 142

## 2011-08-16 LAB — CBC
HCT: 24.9 — ABNORMAL LOW
HCT: 25.8 — ABNORMAL LOW
HCT: 26.1 — ABNORMAL LOW
HCT: 26.6 — ABNORMAL LOW
HCT: 27.2 — ABNORMAL LOW
HCT: 30.3 — ABNORMAL LOW
HCT: 34.8 — ABNORMAL LOW
HCT: 43.4
HCT: 45
Hemoglobin: 10.6 — ABNORMAL LOW
Hemoglobin: 12.3 — ABNORMAL LOW
Hemoglobin: 15
Hemoglobin: 15.6
Hemoglobin: 9 — ABNORMAL LOW
Hemoglobin: 9 — ABNORMAL LOW
Hemoglobin: 9.2 — ABNORMAL LOW
Hemoglobin: 9.3 — ABNORMAL LOW
MCHC: 34.3
MCHC: 34.3
MCHC: 34.5
MCHC: 34.6
MCHC: 34.7
MCHC: 34.8
MCHC: 35
MCHC: 35.3
MCV: 89.2
MCV: 89.3
MCV: 89.8
MCV: 90.2
MCV: 90.5
MCV: 90.5
MCV: 90.5
MCV: 90.5
MCV: 91.3
Platelets: 103 — ABNORMAL LOW
Platelets: 104 — ABNORMAL LOW
Platelets: 109 — ABNORMAL LOW
Platelets: 166
Platelets: 167
Platelets: 86 — ABNORMAL LOW
Platelets: 93 — ABNORMAL LOW
Platelets: 94 — ABNORMAL LOW
RBC: 2.89 — ABNORMAL LOW
RBC: 2.89 — ABNORMAL LOW
RBC: 2.94 — ABNORMAL LOW
RBC: 3.01 — ABNORMAL LOW
RBC: 3.4 — ABNORMAL LOW
RBC: 3.84 — ABNORMAL LOW
RBC: 4.81
RBC: 5.02
RDW: 12.7
RDW: 12.8
RDW: 12.9
RDW: 12.9
RDW: 13
RDW: 13.1
RDW: 13.2
WBC: 10.4
WBC: 10.5
WBC: 11.1 — ABNORMAL HIGH
WBC: 12.3 — ABNORMAL HIGH
WBC: 13.7 — ABNORMAL HIGH
WBC: 8.2
WBC: 8.7

## 2011-08-16 LAB — MAGNESIUM
Magnesium: 2.3
Magnesium: 2.4
Magnesium: 2.7 — ABNORMAL HIGH

## 2011-08-16 LAB — POCT I-STAT 3, ART BLOOD GAS (G3+)
Acid-base deficit: 3 — ABNORMAL HIGH
Acid-base deficit: 3 — ABNORMAL HIGH
Bicarbonate: 22.7
Bicarbonate: 23.2
Bicarbonate: 24.6 — ABNORMAL HIGH
O2 Saturation: 100
Operator id: 203371
Operator id: 298181
Operator id: 3390
Patient temperature: 30
Patient temperature: 36.8
pCO2 arterial: 30.5 — ABNORMAL LOW
pH, Arterial: 7.446
pH, Arterial: 7.571 — ABNORMAL HIGH
pO2, Arterial: 300 — ABNORMAL HIGH
pO2, Arterial: 315 — ABNORMAL HIGH
pO2, Arterial: 54 — ABNORMAL LOW
pO2, Arterial: 73 — ABNORMAL LOW

## 2011-08-16 LAB — URINALYSIS, ROUTINE W REFLEX MICROSCOPIC
Bilirubin Urine: NEGATIVE
Glucose, UA: NEGATIVE
Hgb urine dipstick: NEGATIVE
Ketones, ur: NEGATIVE
Nitrite: NEGATIVE
Protein, ur: NEGATIVE
Specific Gravity, Urine: 1.009
Urobilinogen, UA: 0.2
pH: 6.5

## 2011-08-16 LAB — POCT I-STAT 4, (NA,K, GLUC, HGB,HCT)
Glucose, Bld: 102 — ABNORMAL HIGH
Glucose, Bld: 103 — ABNORMAL HIGH
Glucose, Bld: 90
Hemoglobin: 8.5 — ABNORMAL LOW
Hemoglobin: 8.5 — ABNORMAL LOW
Operator id: 298181
Operator id: 3390
Potassium: 3.6
Potassium: 4.6
Potassium: 6.2 — ABNORMAL HIGH
Sodium: 133 — ABNORMAL LOW
Sodium: 135

## 2011-08-16 LAB — POCT I-STAT GLUCOSE
Glucose, Bld: 97
Operator id: 3390

## 2011-08-16 LAB — BLOOD GAS, ARTERIAL
Acid-Base Excess: 0.9
Bicarbonate: 24.8 — ABNORMAL HIGH
Drawn by: 276051
FIO2: 0.21
O2 Saturation: 94.2
Patient temperature: 98.6
TCO2: 25.9
pCO2 arterial: 38
pH, Arterial: 7.43
pO2, Arterial: 69.6 — ABNORMAL LOW

## 2011-08-16 LAB — COMPREHENSIVE METABOLIC PANEL
CO2: 29
Calcium: 8.8
Chloride: 105
Creatinine, Ser: 1.03
GFR calc non Af Amer: >60
Glucose, Bld: 99
Total Bilirubin: 1.8 — ABNORMAL HIGH

## 2011-08-16 LAB — HEMOGLOBIN A1C: Mean Plasma Glucose: 129

## 2011-08-16 LAB — PROTIME-INR
INR: 1
INR: 1.7 — ABNORMAL HIGH
Prothrombin Time: 13.6
Prothrombin Time: 20.2 — ABNORMAL HIGH

## 2011-08-16 LAB — HEMOGLOBIN AND HEMATOCRIT, BLOOD: HCT: 25.6 — ABNORMAL LOW

## 2011-08-16 LAB — HIGH SENSITIVITY CRP: CRP, High Sensitivity: 0.3

## 2011-08-16 LAB — TSH: TSH: 0.391

## 2011-08-16 LAB — PLATELET COUNT: Platelets: 99 — ABNORMAL LOW

## 2011-08-16 LAB — POCT I-STAT 3, VENOUS BLOOD GAS (G3P V)
O2 Saturation: 89
Operator id: 3390
pCO2, Ven: 24.7 — ABNORMAL LOW
pO2, Ven: 35

## 2011-08-16 LAB — LIPID PANEL
Cholesterol: 158
Total CHOL/HDL Ratio: 4.2
VLDL: 18

## 2011-08-16 LAB — POCT I-STAT, CHEM 8
Chloride: 103
Chloride: 106
Glucose, Bld: 170 — ABNORMAL HIGH
HCT: 27 — ABNORMAL LOW
HCT: 30 — ABNORMAL LOW
Potassium: 4.1
Potassium: 4.2
Sodium: 139

## 2011-08-16 LAB — CREATININE, SERUM
Creatinine, Ser: 0.93
Creatinine, Ser: 1.2
GFR calc Af Amer: >60
GFR calc Af Amer: >60
GFR calc non Af Amer: 59 — ABNORMAL LOW
GFR calc non Af Amer: >60

## 2011-08-16 LAB — APTT
aPTT: 31
aPTT: 40 — ABNORMAL HIGH

## 2011-08-16 LAB — T4: T4, Total: 7.1

## 2011-08-16 LAB — TYPE AND SCREEN
ABO/RH(D): A NEG
Antibody Screen: NEGATIVE

## 2011-08-16 MED ORDER — DIAZEPAM 5 MG PO TABS: ORAL_TABLET | ORAL | Status: DC

## 2011-08-16 NOTE — Telephone Encounter (Signed)
Refilled diazepam 5mg .  One time only

## 2011-10-17 ENCOUNTER — Other Ambulatory Visit: Payer: Self-pay | Admitting: *Deleted

## 2011-10-17 ENCOUNTER — Telehealth: Payer: Self-pay | Admitting: Cardiology

## 2011-10-17 DIAGNOSIS — E7800 Pure hypercholesterolemia, unspecified: Secondary | ICD-10-CM

## 2011-10-17 NOTE — Telephone Encounter (Signed)
Put in orders for labs and schedule

## 2011-10-17 NOTE — Telephone Encounter (Signed)
New Msg: Pt calling to schedule 1 yr FU with [facility] and wanted to schedule lab work 2 days prior to pt appt on 12/24/11. Please put order for pt blood work in.

## 2011-11-03 ENCOUNTER — Other Ambulatory Visit: Payer: Self-pay | Admitting: Cardiology

## 2011-12-24 ENCOUNTER — Other Ambulatory Visit (INDEPENDENT_AMBULATORY_CARE_PROVIDER_SITE_OTHER): Payer: Medicare Other | Admitting: *Deleted

## 2011-12-24 DIAGNOSIS — E7800 Pure hypercholesterolemia, unspecified: Secondary | ICD-10-CM

## 2011-12-24 DIAGNOSIS — M109 Gout, unspecified: Secondary | ICD-10-CM

## 2011-12-24 LAB — LIPID PANEL
HDL: 52.7 mg/dL (ref >39.00)
LDL Cholesterol: 69 mg/dL (ref 0–99)
VLDL: 11.8 mg/dL (ref 0.0–40.0)

## 2011-12-24 LAB — HEPATIC FUNCTION PANEL
Alkaline Phosphatase: 51 U/L (ref 39–117)
Bilirubin, Direct: 0.1 mg/dL (ref 0.0–0.3)
Total Bilirubin: 1 mg/dL (ref 0.3–1.2)
Total Protein: 6.8 g/dL (ref 6.0–8.3)

## 2011-12-24 LAB — BASIC METABOLIC PANEL
BUN: 22 mg/dL (ref 6–23)
Chloride: 107 mEq/L (ref 96–112)
Creatinine, Ser: 1 mg/dL (ref 0.4–1.5)
Glucose, Bld: 101 mg/dL — ABNORMAL HIGH (ref 70–99)
Potassium: 3.7 mEq/L (ref 3.5–5.1)

## 2011-12-24 LAB — URIC ACID: Uric Acid, Serum: 6.7 mg/dL (ref 4.0–7.8)

## 2011-12-26 ENCOUNTER — Other Ambulatory Visit: Payer: Medicare Other | Admitting: *Deleted

## 2011-12-26 ENCOUNTER — Ambulatory Visit (INDEPENDENT_AMBULATORY_CARE_PROVIDER_SITE_OTHER): Payer: Medicare Other | Admitting: Cardiology

## 2011-12-26 ENCOUNTER — Encounter: Payer: Self-pay | Admitting: Cardiology

## 2011-12-26 VITALS — BP 120/78 | HR 60 | Ht 70.0 in | Wt 184.0 lb

## 2011-12-26 DIAGNOSIS — E7800 Pure hypercholesterolemia, unspecified: Secondary | ICD-10-CM

## 2011-12-26 DIAGNOSIS — Z951 Presence of aortocoronary bypass graft: Secondary | ICD-10-CM

## 2011-12-26 DIAGNOSIS — I119 Hypertensive heart disease without heart failure: Secondary | ICD-10-CM

## 2011-12-26 NOTE — Assessment & Plan Note (Signed)
The patient walks regularly for exercise.  He has not been experiencing any exertional chest pain.

## 2011-12-26 NOTE — Progress Notes (Signed)
Candelaria Celeste Date of Birth:  1932-09-19 [facility] [address] [place], [place]  [address] [phone]  Fax   [phone]  HPI: This pleasant 78 year old gentleman is seen for a six-month followup office visit.  He has been in good health since we saw him with no new cardiac problems.  His weight has had some problems.  She had a mastectomy for breast cancer and is doing well.  She also has some memory issues.  Because of these factors the patient and his wife are trying to sell her home and move to [place].  Current Outpatient Prescriptions  Medication Sig Dispense Refill  . aspirin EC 81 MG tablet Take 1 tablet (81 mg total) by mouth daily.  150 tablet  2  . diazepam (VALIUM) 5 MG tablet One tablet daily  90 tablet  0  . metoprolol (LOPRESSOR) 50 MG tablet TAKE 1 TABLET TWICE A DAY  180 tablet  4  . Multiple Vitamin (MULTIVITAMIN) tablet Take 1 tablet by mouth daily.        . nitroGLYCERIN (NITROSTAT) 0.4 MG SL tablet Place 0.4 mg under the tongue every 5 (five) minutes as needed.        . rosuvastatin (CRESTOR) 5 MG tablet Take 5 mg by mouth every other day. Three times a week      . silodosin (RAPAFLO) 8 MG CAPS capsule Take 8 mg by mouth daily with breakfast. As needed      . tadalafil (CIALIS) 20 MG tablet Take 1 tablet (20 mg total) by mouth daily as needed.  18 tablet  3  . DISCONTD: silodosin (RAPAFLO) 8 MG CAPS capsule Take 1 capsule (8 mg total) by mouth daily with breakfast.  30 capsule  11    Allergies  Allergen Reactions  . Halcion (Triazolam)   . Lipitor (Atorvastatin Calcium)   . Lisinopril Cough    Patient Active Problem List  Diagnoses  . Hx of CABG  . Gout  . BPH (benign prostatic hyperplasia)  . Erectile dysfunction  . Benign hypertensive heart disease without heart failure    History  Smoking status  . Never Smoker   Smokeless tobacco  . Not on file    History  Alcohol Use No    Family History  Problem  Relation Age of Onset  . Heart attack Father   . Heart attack Brother     Review of Systems: The patient denies any heat or cold intolerance.  No weight gain or weight loss.  The patient denies headaches or blurry vision.  There is no cough or sputum production.  The patient denies dizziness.  There is no hematuria or hematochezia.  The patient denies any muscle aches or arthritis.  The patient denies any rash.  The patient denies frequent falling or instability.  There is no history of depression or anxiety.  All other systems were reviewed and are negative.   Physical Exam: Filed Vitals:   12/26/11 0905  BP: 120/78  Pulse: 60   The general appearance reveals a well-developed well-nourished gentleman in no distress.The head and neck exam reveals pupils equal and reactive.  Extraocular movements are full.  There is no scleral icterus.  The mouth and pharynx are normal.  The neck is supple.  The carotids reveal no bruits.  The jugular venous pressure is normal.  The  thyroid is not enlarged.  There is no lymphadenopathy.  The chest is clear to percussion and auscultation.  There are no rales or rhonchi.  Expansion of the chest is symmetrical.  The precordium is quiet.  The first heart sound is normal.  The second heart sound is physiologically split.  There is no murmur gallop rub or click.  There is no abnormal lift or heave.  The abdomen is soft and nontender.  The bowel sounds are normal.  The liver and spleen are not enlarged.  There are no abdominal masses.  There are no abdominal bruits.  Extremities reveal good pedal pulses.  There is no phlebitis or edema.  There is no cyanosis or clubbing.  Strength is normal and symmetrical in all extremities.  There is no lateralizing weakness.  There are no sensory deficits.  The skin is warm and dry.  There is no rash.     Assessment / Plan: Continue present medication.  Recheck in 6 months for office visit EKG and fasting lab work.

## 2011-12-26 NOTE — Patient Instructions (Signed)
Your physician recommends that you continue on your current medications as directed. Please refer to the Current Medication list given to you today.  Your physician wants you to follow-up in: 6 months. You will receive a reminder letter in the mail two months in advance. If you don't receive a letter, please call our office to schedule the follow-up appointment.  

## 2011-12-26 NOTE — Assessment & Plan Note (Signed)
The patient blood pressure was remaining normal on current therapy.

## 2012-01-17 ENCOUNTER — Telehealth: Payer: Self-pay | Admitting: Cardiology

## 2012-01-17 NOTE — Telephone Encounter (Signed)
Pt rtn call to melinda °

## 2012-01-17 NOTE — Telephone Encounter (Signed)
Spoke with patient and will refax

## 2012-01-17 NOTE — Telephone Encounter (Signed)
New msg Pt called back and said please fax info to [facility] or call Darel Hong at [phone]

## 2012-02-20 ENCOUNTER — Other Ambulatory Visit: Payer: Self-pay | Admitting: *Deleted

## 2012-02-20 MED ORDER — ROSUVASTATIN CALCIUM 5 MG PO TABS: 5.0000 mg | ORAL_TABLET | ORAL | Status: DC

## 2012-05-07 ENCOUNTER — Telehealth: Payer: Self-pay | Admitting: Cardiology

## 2012-05-07 NOTE — Telephone Encounter (Signed)
Ok to do labs at [facility],HFP BMET and PSA

## 2012-05-07 NOTE — Telephone Encounter (Signed)
Please return call to patient at [phone], regarding sending lab orders to office near his home.

## 2012-05-07 NOTE — Telephone Encounter (Signed)
Pt is requesting to do labs at [facility].  Wants labs for Dr Yevonne Pax 8/21 visit to save him a trip to [place].  Please fax order.  He also wants to add PSA.

## 2012-05-12 ENCOUNTER — Telehealth: Payer: Self-pay | Admitting: Cardiology

## 2012-05-12 NOTE — Telephone Encounter (Signed)
New Problem: ° ° ° °Patient returned your call.  Please call back. °

## 2012-05-12 NOTE — Telephone Encounter (Signed)
Discussed labs, will get at [facility], will fax order

## 2012-05-12 NOTE — Telephone Encounter (Signed)
Left message to call back  

## 2012-05-12 NOTE — Telephone Encounter (Signed)
Discussed labs, will get at [facility]

## 2012-06-06 ENCOUNTER — Other Ambulatory Visit: Payer: Self-pay | Admitting: *Deleted

## 2012-06-06 MED ORDER — ALLOPURINOL 100 MG PO TABS: 100.0000 mg | ORAL_TABLET | Freq: Every day | ORAL | Status: DC

## 2012-06-06 NOTE — Telephone Encounter (Signed)
Refilled allopurinol 

## 2012-07-09 ENCOUNTER — Encounter: Payer: Self-pay | Admitting: Cardiology

## 2012-07-09 ENCOUNTER — Ambulatory Visit (INDEPENDENT_AMBULATORY_CARE_PROVIDER_SITE_OTHER): Payer: Medicare Other | Admitting: Cardiology

## 2012-07-09 VITALS — BP 130/80 | HR 57 | Ht 70.0 in | Wt 179.0 lb

## 2012-07-09 DIAGNOSIS — Z951 Presence of aortocoronary bypass graft: Secondary | ICD-10-CM

## 2012-07-09 DIAGNOSIS — E7800 Pure hypercholesterolemia, unspecified: Secondary | ICD-10-CM

## 2012-07-09 DIAGNOSIS — N529 Male erectile dysfunction, unspecified: Secondary | ICD-10-CM

## 2012-07-09 DIAGNOSIS — G4700 Insomnia, unspecified: Secondary | ICD-10-CM

## 2012-07-09 DIAGNOSIS — I119 Hypertensive heart disease without heart failure: Secondary | ICD-10-CM

## 2012-07-09 MED ORDER — TADALAFIL 20 MG PO TABS: 20.0000 mg | ORAL_TABLET | Freq: Every day | ORAL | Status: DC | PRN

## 2012-07-09 MED ORDER — DIAZEPAM 5 MG PO TABS: 5.0000 mg | ORAL_TABLET | Freq: Every evening | ORAL | Status: DC | PRN

## 2012-07-09 NOTE — Patient Instructions (Addendum)
Your physician recommends that you continue on your current medications as directed. Please refer to the Current Medication list given to you today.  Your physician wants you to follow-up in: 6 months. You will receive a reminder letter in the mail two months in advance. If you don't receive a letter, please call our office to schedule the follow-up appointment.  

## 2012-07-09 NOTE — Assessment & Plan Note (Signed)
The patient has not been having any headaches or dizzy spells.  No shortness of breath.  Weight is down 5 pounds since last visit.

## 2012-07-09 NOTE — Assessment & Plan Note (Signed)
The patient walks regularly for exercise.  He has not been experiencing any exertional chest pain recurrent angina

## 2012-07-09 NOTE — Progress Notes (Signed)
Thomas Diaz Date of Birth:  05-Jun-1932 [facility] [address] [place], [place]  [address] [phone]  Fax   [phone]  HPI: This pleasant 78 year old gentleman is seen for a scheduled six-month followup office visit. He has been feeling well from a cardiac standpoint. He has a past history of ischemic heart disease and underwent coronary artery bypass graft surgery in June of 2009. Since then he has done well with no recurrent chest discomfort. He walks daily for exercise. He has a history of essential hypertension. He's also had mild hypercholesterolemia and he is on statin therapy. He's had some urinary issues and has seen Dr. Shiela Mayer who put him on rapaflo which has improved his situation. He has a history of erectile dysfunction and is on p.r.n. Cialis 20 mg tablet.  The patient has a past history of gout.  He has had no flareups since starting allopurinol.  Current Outpatient Prescriptions  Medication Sig Dispense Refill  . allopurinol (ZYLOPRIM) 100 MG tablet Take 100 mg by mouth daily. As needed      . diazepam (VALIUM) 5 MG tablet One tablet daily as needed      . metoprolol (LOPRESSOR) 50 MG tablet TAKE 1 TABLET TWICE A DAY  180 tablet  4  . Multiple Vitamin (MULTIVITAMIN) tablet Take 1 tablet by mouth daily.        . nitroGLYCERIN (NITROSTAT) 0.4 MG SL tablet Place 0.4 mg under the tongue every 5 (five) minutes as needed.        . rosuvastatin (CRESTOR) 5 MG tablet Take 1 tablet (5 mg total) by mouth every other day. Three times a week  90 tablet  2  . silodosin (RAPAFLO) 8 MG CAPS capsule Take 8 mg by mouth daily with breakfast. As needed      . tadalafil (CIALIS) 20 MG tablet Take 1 tablet (20 mg total) by mouth daily as needed.  18 tablet  3  . DISCONTD: allopurinol (ZYLOPRIM) 100 MG tablet Take 1 tablet (100 mg total) by mouth daily.  30 tablet  5    Allergies  Allergen Reactions  . Halcion (Triazolam)   . Lipitor (Atorvastatin Calcium)   .  Lisinopril Cough    Patient Active Problem List  Diagnosis  . Hx of CABG  . Gout  . BPH (benign prostatic hyperplasia)  . Erectile dysfunction  . Benign hypertensive heart disease without heart failure    History  Smoking status  . Never Smoker   Smokeless tobacco  . Not on file    History  Alcohol Use No    Family History  Problem Relation Age of Onset  . Heart attack Father   . Heart attack Brother     Review of Systems: The patient denies any heat or cold intolerance.  No weight gain or weight loss.  The patient denies headaches or blurry vision.  There is no cough or sputum production.  The patient denies dizziness.  There is no hematuria or hematochezia.  The patient denies any muscle aches or arthritis.  The patient denies any rash.  The patient denies frequent falling or instability.  There is no history of depression or anxiety.  All other systems were reviewed and are negative.   Physical Exam: Filed Vitals:   07/09/12 0914  BP: 130/80  Pulse: 57   The general appearance reveals a well-developed well-nourished gentleman in no distress.The head and neck exam reveals pupils equal and reactive.  Extraocular movements are full.  There is no scleral icterus.  The mouth and pharynx are normal.  The neck is supple.  The carotids reveal no bruits.  The jugular venous pressure is normal.  The  thyroid is not enlarged.  There is no lymphadenopathy.  The chest is clear to percussion and auscultation.  There are no rales or rhonchi.  Expansion of the chest is symmetrical.  The precordium is quiet.  The first heart sound is normal.  The second heart sound is physiologically split.  There is no murmur gallop rub or click.  There is no abnormal lift or heave.  The abdomen is soft and nontender.  The bowel sounds are normal.  The liver and spleen are not enlarged.  There are no abdominal masses.  There are no abdominal bruits.  Extremities reveal good pedal pulses.  There is no  phlebitis or edema.  There is no cyanosis or clubbing.  Strength is normal and symmetrical in all extremities.  There is no lateralizing weakness.  There are no sensory deficits.  The skin is warm and dry.  There is no rash.  EKG today shows sinus bradycardia with nonspecific ST-T wave abnormalities.  Occasional PVCs are present.  No old tracings are available for comparison.   Assessment / Plan: The patient is to continue same medication.  He had lab work drawn yesterday at [facility] and he will fax the results to [place].  We will plan to see him in 6 months for followup office visit and EKG.  We will also get lab work next time if he does not get it at [facility]

## 2013-01-26 ENCOUNTER — Telehealth: Payer: Self-pay | Admitting: Cardiology

## 2013-01-26 NOTE — Telephone Encounter (Signed)
New problem     Metoprolol  &  crestor   [facility]

## 2013-01-27 ENCOUNTER — Other Ambulatory Visit: Payer: Self-pay

## 2013-01-27 MED ORDER — ROSUVASTATIN CALCIUM 5 MG PO TABS: 5.0000 mg | ORAL_TABLET | ORAL | Status: DC

## 2013-01-27 MED ORDER — METOPROLOL TARTRATE 50 MG PO TABS: ORAL_TABLET | ORAL | Status: DC

## 2013-04-28 ENCOUNTER — Ambulatory Visit (INDEPENDENT_AMBULATORY_CARE_PROVIDER_SITE_OTHER): Payer: Medicare Other | Admitting: Cardiology

## 2013-04-28 ENCOUNTER — Encounter: Payer: Self-pay | Admitting: Cardiology

## 2013-04-28 VITALS — BP 162/70 | HR 59 | Ht 70.0 in | Wt 184.8 lb

## 2013-04-28 DIAGNOSIS — I119 Hypertensive heart disease without heart failure: Secondary | ICD-10-CM

## 2013-04-28 DIAGNOSIS — Z951 Presence of aortocoronary bypass graft: Secondary | ICD-10-CM

## 2013-04-28 NOTE — Progress Notes (Signed)
Thomas Diaz Date of Birth:  23-Jun-1932 [facility] [address] [place], [place]  [address] [phone]  Fax   [phone]  HPI: This pleasant 79 year old gentleman is seen for a scheduled six-month followup office visit. He has been feeling well from a cardiac standpoint. He has a past history of ischemic heart disease and underwent coronary artery bypass graft surgery in June of 2009. Since then he has done well with no recurrent chest discomfort. He walks daily for exercise. He has a history of essential hypertension. He's also had mild hypercholesterolemia and he is on statin therapy. He's had some urinary issues and he is no longer taking rapid flow. He has a history of erectile dysfunction and is on p.r.n. Cialis 20 mg tablet. The patient has a past history of gout. He has had no flareups since starting allopurinol.    Current Outpatient Prescriptions  Medication Sig Dispense Refill  . allopurinol (ZYLOPRIM) 100 MG tablet Take 100 mg by mouth daily. As needed      . diazepam (VALIUM) 5 MG tablet Take 1 tablet (5 mg total) by mouth at bedtime as needed for anxiety. One tablet daily as needed  30 tablet  3  . metoprolol (LOPRESSOR) 50 MG tablet Take 1 tab twice a day  180 tablet  4  . nitroGLYCERIN (NITROSTAT) 0.4 MG SL tablet Place 0.4 mg under the tongue every 5 (five) minutes as needed.        . rosuvastatin (CRESTOR) 5 MG tablet Take 1 tablet (5 mg total) by mouth every other day. Three times a week  90 tablet  2  . tadalafil (CIALIS) 20 MG tablet Take 1 tablet (20 mg total) by mouth daily as needed.  18 tablet  3  . silodosin (RAPAFLO) 8 MG CAPS capsule Take 8 mg by mouth daily with breakfast. As needed       No current facility-administered medications for this visit.    Allergies  Allergen Reactions  . Halcion (Triazolam)   . Lipitor (Atorvastatin Calcium)   . Lisinopril Cough    Patient Active Problem List   Diagnosis Date Noted  . Hx of CABG  05/30/2011  . Gout 05/30/2011  . BPH (benign prostatic hyperplasia) 05/30/2011  . Erectile dysfunction 05/30/2011  . Benign hypertensive heart disease without heart failure 05/30/2011    History  Smoking status  . Never Smoker   Smokeless tobacco  . Not on file    History  Alcohol Use No    Family History  Problem Relation Age of Onset  . Heart attack Father   . Heart attack Brother     Review of Systems: The patient denies any heat or cold intolerance.  No weight gain or weight loss.  The patient denies headaches or blurry vision.  There is no cough or sputum production.  The patient denies dizziness.  There is no hematuria or hematochezia.  The patient denies any muscle aches or arthritis.  The patient denies any rash.  The patient denies frequent falling or instability.  There is no history of depression or anxiety.  All other systems were reviewed and are negative.   Physical Exam: Filed Vitals:   04/28/13 1057  BP: 162/70  Pulse: 59   the general appearance reveals a well-developed well-nourished gentleman in no distress.The head and neck exam reveals pupils equal and reactive.  Extraocular movements are full.  There is no scleral icterus.  The mouth and pharynx are normal.  The  neck is supple.  The carotids reveal no bruits.  The jugular venous pressure is normal.  The  thyroid is not enlarged.  There is no lymphadenopathy.  The chest is clear to percussion and auscultation.  There are no rales or rhonchi.  Expansion of the chest is symmetrical.  The precordium is quiet.  The first heart sound is normal.  The second heart sound is physiologically split.  There is no murmur gallop rub or click.  There is no abnormal lift or heave.  The abdomen is soft and nontender.  The bowel sounds are normal.  The liver and spleen are not enlarged.  There are no abdominal masses.  There are no abdominal bruits.  Extremities reveal good pedal pulses.  There is no phlebitis or edema.  There is  no cyanosis or clubbing.  Strength is normal and symmetrical in all extremities.  There is no lateralizing weakness.  There are no sensory deficits.  The skin is warm and dry.  There is no rash.  EKG shows normal sinus rhythm and is within normal limits   Assessment / Plan: Continue same medication.  Recheck in 6 months for followup office visit and EKG

## 2013-04-28 NOTE — Assessment & Plan Note (Signed)
He has some element of white coat syndrome.  Blood pressures at home are normal.

## 2013-04-28 NOTE — Assessment & Plan Note (Signed)
The patient has not been experiencing any recurrent angina pectoris to

## 2013-04-28 NOTE — Patient Instructions (Signed)
STOP MULTIVITAMIN   KEEP OTHER MEDICATIONS THE SAME  Your physician wants you to follow-up in: 6 month ov /ekg You will receive a reminder letter in the mail two months in advance. If you don't receive a letter, please call our office to schedule the follow-up appointment.

## 2013-05-05 ENCOUNTER — Other Ambulatory Visit: Payer: Self-pay | Admitting: *Deleted

## 2013-05-05 DIAGNOSIS — G4700 Insomnia, unspecified: Secondary | ICD-10-CM

## 2013-05-05 MED ORDER — DIAZEPAM 5 MG PO TABS: 5.0000 mg | ORAL_TABLET | Freq: Every evening | ORAL | Status: DC | PRN

## 2013-05-05 NOTE — Telephone Encounter (Signed)
Refill diazepam

## 2013-06-16 ENCOUNTER — Encounter: Payer: Self-pay | Admitting: Cardiology

## 2013-06-16 ENCOUNTER — Ambulatory Visit (INDEPENDENT_AMBULATORY_CARE_PROVIDER_SITE_OTHER): Payer: Medicare Other | Admitting: Cardiology

## 2013-06-16 ENCOUNTER — Telehealth: Payer: Self-pay | Admitting: Cardiology

## 2013-06-16 VITALS — BP 140/90 | HR 92 | Ht 70.0 in | Wt 184.8 lb

## 2013-06-16 DIAGNOSIS — Z951 Presence of aortocoronary bypass graft: Secondary | ICD-10-CM

## 2013-06-16 DIAGNOSIS — I480 Paroxysmal atrial fibrillation: Secondary | ICD-10-CM | POA: Insufficient documentation

## 2013-06-16 DIAGNOSIS — I119 Hypertensive heart disease without heart failure: Secondary | ICD-10-CM

## 2013-06-16 DIAGNOSIS — I4891 Unspecified atrial fibrillation: Secondary | ICD-10-CM

## 2013-06-16 DIAGNOSIS — I4819 Other persistent atrial fibrillation: Secondary | ICD-10-CM

## 2013-06-16 HISTORY — DX: Other persistent atrial fibrillation: I48.19

## 2013-06-16 MED ORDER — RIVAROXABAN 20 MG PO TABS: 20.0000 mg | ORAL_TABLET | Freq: Every day | ORAL | Status: DC

## 2013-06-16 MED ORDER — METOPROLOL TARTRATE 50 MG PO TABS: ORAL_TABLET | ORAL | Status: DC

## 2013-06-16 NOTE — Telephone Encounter (Signed)
Last week has had more difficulty finding heart rate. Last night took blood pressure and heart rate was up to 100. Takes metoprolol 50 mg bid so he took an extra 50 mg last night and a valium. Heart rate still in the 80's and usually runs in 60's. Discussed with  Dr. Patty Sermons and patient will be seen today for ov, advised patient

## 2013-06-16 NOTE — Assessment & Plan Note (Signed)
EKG today confirms that he has gone into atrial fibrillation.  His rate is not fast because he is already on metoprolol.  He thinks that he has been in atrial fibrillation for about a week.  He has not had any TIA symptoms.

## 2013-06-16 NOTE — Progress Notes (Signed)
Thomas Diaz Date of Birth:  [birth year] [facility] [address] [place], [place]  [address] [phone]         Fax   [phone]  History of Present Illness: This pleasant 79 year old gentleman is seen for a urgent work in office visit.  For the past week he has noted that his pulse seems to be faster and he has had more trouble feeling it.  He has not had any chest pain or shortness of breath.  He has noted some decrease in overall energy level.  He has been under a lot of stress because his wife has been diagnosed with Lewy body dementia. He has a past history of ischemic heart disease and underwent coronary artery bypass graft surgery in June of 2009. Since then he has done well with no recurrent chest discomfort. He walks daily for exercise. He has a history of essential hypertension. He's also had mild hypercholesterolemia and he is on statin therapy. He's had some urinary issues and he is no longer taking rapid flow. He has a history of erectile dysfunction and is on p.r.n. Cialis 20 mg tablet. The patient has a past history of gout. He has had no flareups since starting allopurinol.    Current Outpatient Prescriptions  Medication Sig Dispense Refill  . diazepam (VALIUM) 5 MG tablet Take 1 tablet (5 mg total) by mouth at bedtime as needed for anxiety. One tablet daily as needed  30 tablet  3  . metoprolol (LOPRESSOR) 50 MG tablet 2 TABLETS IN THE MORNING AND 1 TABLET IN THE EVENING  270 tablet  3  . rosuvastatin (CRESTOR) 5 MG tablet Take 1 tablet (5 mg total) by mouth every other day. Three times a week  90 tablet  2  . silodosin (RAPAFLO) 8 MG CAPS capsule Take 8 mg by mouth daily with breakfast. As needed      . tadalafil (CIALIS) 20 MG tablet Take 1 tablet (20 mg total) by mouth daily as needed.  18 tablet  3  . allopurinol (ZYLOPRIM) 100 MG tablet Take 100 mg by mouth daily. As needed      . nitroGLYCERIN (NITROSTAT) 0.4 MG SL tablet Place 0.4 mg under the  tongue every 5 (five) minutes as needed.        . Rivaroxaban (XARELTO) 20 MG TABS Take 1 tablet (20 mg total) by mouth daily.  30 tablet  5   No current facility-administered medications for this visit.    Allergies  Allergen Reactions  . Halcion (Triazolam)   . Lipitor (Atorvastatin Calcium)   . Lisinopril Cough    Patient Active Problem List   Diagnosis Date Noted  . Atrial fibrillation 06/16/2013  . Hx of CABG 05/30/2011  . Gout 05/30/2011  . BPH (benign prostatic hyperplasia) 05/30/2011  . Erectile dysfunction 05/30/2011  . Benign hypertensive heart disease without heart failure 05/30/2011    History  Smoking status  . Never Smoker   Smokeless tobacco  . Not on file    History  Alcohol Use No    Family History  Problem Relation Age of Onset  . Heart attack Father   . Heart attack Brother     Review of Systems: Constitutional: no fever chills diaphoresis or fatigue or change in weight.  Head and neck: no hearing loss, no epistaxis, no photophobia or visual disturbance. Respiratory: No cough, shortness of breath or wheezing. Cardiovascular: No chest pain peripheral edema, palpitations. Gastrointestinal: No abdominal distention, no abdominal  pain, no change in bowel habits hematochezia or melena. Genitourinary: No dysuria, no frequency, no urgency, no nocturia. Musculoskeletal:No arthralgias, no back pain, no gait disturbance or myalgias. Neurological: No dizziness, no headaches, no numbness, no seizures, no syncope, no weakness, no tremors. Hematologic: No lymphadenopathy, no easy bruising. Psychiatric: No confusion, no hallucinations, no sleep disturbance.    Physical Exam: Filed Vitals:   06/16/13 1315  BP: 140/90  Pulse: 92   the general appearance reveals a well-developed well-nourished gentleman in no distress.The head and neck exam reveals pupils equal and reactive.  Extraocular movements are full.  There is no scleral icterus.  The mouth and  pharynx are normal.  The neck is supple.  The carotids reveal no bruits.  The jugular venous pressure is normal.  The  thyroid is not enlarged.  There is no lymphadenopathy.  The chest is clear to percussion and auscultation.  There are no rales or rhonchi.  Expansion of the chest is symmetrical.  The precordium is quiet.  The pulse is irregularly irregular  The first heart sound is normal.  The second heart sound is physiologically split.  There is no murmur gallop rub or click.  There is no abnormal lift or heave.  The abdomen is soft and nontender.  The bowel sounds are normal.  The liver and spleen are not enlarged.  There are no abdominal masses.  There are no abdominal bruits.  Extremities reveal good pedal pulses.  There is no phlebitis or edema.  There is no cyanosis or clubbing.  Strength is normal and symmetrical in all extremities.  There is no lateralizing weakness.  There are no sensory deficits.  The skin is warm and dry.  There is no rash.  EKG shows atrial fibrillation with a rate of 92   Assessment / Plan: Impression was new onset atrial fibrillation with onset about one week ago.  He needs to be on anticoagulation.  He has normal renal function.  His creatinine in February 2014 was 1.13 elsewhere.  We will treat him with Xarelto 20 mg daily and he will stop his aspirin.  For better rate control we will increase his Lopressor up to 100 mg in the morning and 50 mg in the evening.  We will obtain a two-dimensional echocardiogram.  We will anticoagulate for about a month and then plan cardioversion.  Return in about 3 weeks for office visit and EKG to discuss timing of cardioversion

## 2013-06-16 NOTE — Telephone Encounter (Signed)
New Prob      Pt states his HR has been very high lately. Would like to speak to nurse.

## 2013-06-16 NOTE — Telephone Encounter (Signed)
New prob   Pt states he has a high heart rate and would like to speak with a nurse.

## 2013-06-16 NOTE — Assessment & Plan Note (Signed)
Patient has had no angina pectoris

## 2013-06-16 NOTE — Assessment & Plan Note (Signed)
Blood pressure has been stable.  

## 2013-06-16 NOTE — Patient Instructions (Addendum)
INCREASE YOUR LOPRESSOR (METOPROLOL) TO 100 MG IN THE MORNING AND 50 MG IN THE EVENING  START XARELTO 20 MG DAILY  STOP ASPIRIN  Your physician has requested that you have an echocardiogram. Echocardiography is a painless test that uses sound waves to create images of your heart. It provides your doctor with information about the size and shape of your heart and how well your heart's chambers and valves are working. This procedure takes approximately one hour. There are no restrictions for this procedure.  Your physician recommends that you schedule a follow-up appointment in: 3 WEEK OV/EKG  Atrial Fibrillation Your caregiver has diagnosed you with atrial fibrillation (AFib). The heart normally beats very regularly; AFib is a type of irregular heartbeat. The heart rate may be faster or slower than normal. This can prevent your heart from pumping as well as it should. AFib can be constant (chronic) or intermittent (paroxysmal). CAUSES  Atrial fibrillation may be caused by:  Heart disease, including heart attack, coronary artery disease, heart failure, diseases of the heart valves, and others.  Blood clot in the lungs (pulmonary embolism).  Pneumonia or other infections.  Chronic lung disease.  Thyroid disease.  Toxins. These include alcohol, some medications (such as decongestant medications or diet pills), and caffeine. In some people, no cause for AFib can be found. This is referred to as Lone Atrial Fibrillation. SYMPTOMS   Palpitations or a fluttering in your chest.  A vague sense of chest discomfort.  Shortness of breath.  Sudden onset of lightheadedness or weakness. Sometimes, the first sign of AFib can be a complication of the condition. This could be a stroke or heart failure. DIAGNOSIS  Your description of your condition may make your caregiver suspicious of atrial fibrillation. Your caregiver will examine your pulse to determine if fibrillation is present. An EKG  (electrocardiogram) will confirm the diagnosis. Further testing may help determine what caused you to have atrial fibrillation. This may include chest x-ray, echocardiogram, blood tests, or CT scans. PREVENTION  If you have previously had atrial fibrillation, your caregiver may advise you to avoid substances known to cause the condition (such as stimulant medications, and possibly caffeine or alcohol). You may be advised to use medications to prevent recurrence. Proper treatment of any underlying condition is important to help prevent recurrence. PROGNOSIS  Atrial fibrillation does tend to become a chronic condition over time. It can cause significant complications (see below). Atrial fibrillation is not usually immediately life-threatening, but it can shorten your life expectancy. This seems to be worse in women. If you have lone atrial fibrillation and are under 54 years old, the risk of complications is very low, and life expectancy is not shortened. RISKS AND COMPLICATIONS  Complications of atrial fibrillation can include stroke, chest pain, and heart failure. Your caregiver will recommend treatments for the atrial fibrillation, as well as for any underlying conditions, to help minimize risk of complications. TREATMENT  Treatment for AFib is divided into several categories:  Treatment of any underlying condition.  Converting you out of AFib into a regular (sinus) rhythm.  Controlling rapid heart rate.  Prevention of blood clots and stroke. Medications and procedures are available to convert your atrial fibrillation to sinus rhythm. However, recent studies have shown that this may not offer you any advantage, and cardiac experts are continuing research and debate on this topic. More important is controlling your rapid heartbeat. The rapid heartbeat causes more symptoms, and places strain on your heart. Your caregiver will advise you  on the use of medications that can control your heart  rate. Atrial fibrillation is a strong stroke risk. You can lessen this risk by taking blood thinning medications such as Coumadin (warfarin), or sometimes aspirin. These medications need close monitoring by your caregiver. Over-medication can cause bleeding. Too little medication may not protect against stroke. HOME CARE INSTRUCTIONS   If your caregiver prescribed medicine to make your heartbeat more normally, take as directed.  If blood thinners were prescribed by your caregiver, take EXACTLY as directed.  Perform blood tests EXACTLY as directed.  Quit smoking. Smoking increases your cardiac and lung (pulmonary) risks.  DO NOT drink alcohol.  DO NOT drink caffeinated drinks (e.g. coffee, soda, chocolate, and leaf teas). You may drink decaffeinated coffee, soda or tea.  If you are overweight, you should choose a reduced calorie diet to lose weight. Please see a registered dietitian if you need more information about healthy weight loss. DO NOT USE DIET PILLS as they may aggravate heart problems.  If you have other heart problems that are causing AFib, you may need to eat a low salt, fat, and cholesterol diet. Your caregiver will tell you if this is necessary.  Exercise every day to improve your physical fitness. Stay active unless advised otherwise.  If your caregiver has given you a follow-up appointment, it is very important to keep that appointment. Not keeping the appointment could result in heart failure or stroke. If there is any problem keeping the appointment, you must call back to this facility for assistance. SEEK MEDICAL CARE IF:  You notice a change in the rate, rhythm or strength of your heartbeat.  You develop an infection or any other change in your overall health status. SEEK IMMEDIATE MEDICAL CARE IF:   You develop chest pain, abdominal pain, sweating, weakness or feel sick to your stomach (nausea).  You develop shortness of breath.  You develop swollen feet and  ankles.  You develop dizziness, numbness, or weakness of your face or limbs, or any change in vision or speech. MAKE SURE YOU:   Understand these instructions.  Will watch your condition.  Will get help right away if you are not doing well or get worse. Document Released: 11/05/2005 Document Revised: 01/28/2012 Document Reviewed: 06/14/2010 [facility] Patient Information 2014 [place], [place].

## 2013-06-19 ENCOUNTER — Ambulatory Visit (HOSPITAL_COMMUNITY): Payer: Medicare Other | Attending: Cardiology | Admitting: Radiology

## 2013-06-19 DIAGNOSIS — I2510 Atherosclerotic heart disease of native coronary artery without angina pectoris: Secondary | ICD-10-CM | POA: Insufficient documentation

## 2013-06-19 DIAGNOSIS — I4891 Unspecified atrial fibrillation: Secondary | ICD-10-CM

## 2013-06-19 DIAGNOSIS — E785 Hyperlipidemia, unspecified: Secondary | ICD-10-CM | POA: Insufficient documentation

## 2013-06-19 DIAGNOSIS — Z951 Presence of aortocoronary bypass graft: Secondary | ICD-10-CM

## 2013-06-19 DIAGNOSIS — I25810 Atherosclerosis of coronary artery bypass graft(s) without angina pectoris: Secondary | ICD-10-CM

## 2013-06-19 DIAGNOSIS — I10 Essential (primary) hypertension: Secondary | ICD-10-CM | POA: Insufficient documentation

## 2013-06-19 NOTE — Progress Notes (Signed)
Echocardiogram performed.  

## 2013-06-23 ENCOUNTER — Telehealth: Payer: Self-pay | Admitting: Cardiology

## 2013-06-23 DIAGNOSIS — I4891 Unspecified atrial fibrillation: Secondary | ICD-10-CM

## 2013-06-23 NOTE — Telephone Encounter (Signed)
Patient called to report that his heart has been "out of rhythm" for two (2) weeks. Went back to normal rhythm yesterday. Patient does not know what triggered it or what made it go back "in rhythm". Asymptomatic during episode. Denies lightheadedness, nausea, weakness, SOB. States is staying hydrated and not over-exerting. BP has been running 125/75 range and HR today was 65 but during episode varied in 100-110 range. States he is wondering if his medications, notably Metoprolol, should be adjusted.   Dr. Patty Sermons advised that patient is to continue on his medications with no changes. Dr. Patty Sermons reiterated that patient definitely remain taking Xarelto. Dr. Patty Sermons advised that patient could also come into office for EKG in the next day or so to confirm rhythm status. Patient agrees with treatment plan. States he will come in on Thursday morning for EKG. Patient advised to call office back or go to ED (if after hours) should he experience worsening symptoms or feel he is "back out of rhythm".

## 2013-06-23 NOTE — Telephone Encounter (Signed)
New prob  Pt states that he had an arrhythmic heart beat for 2 weeks, but now it is back to normal. He said his hr was at 100 but now its back to normal. Would like to speak with a nurse regarding his medication.

## 2013-06-25 ENCOUNTER — Ambulatory Visit (INDEPENDENT_AMBULATORY_CARE_PROVIDER_SITE_OTHER): Payer: Medicare Other | Admitting: *Deleted

## 2013-06-25 ENCOUNTER — Encounter: Payer: Self-pay | Admitting: *Deleted

## 2013-06-25 VITALS — BP 118/78 | HR 63 | Wt 186.8 lb

## 2013-06-25 DIAGNOSIS — I4891 Unspecified atrial fibrillation: Secondary | ICD-10-CM

## 2013-06-25 NOTE — Telephone Encounter (Signed)
ECHO results were reviewed with patient who verbalized understanding.

## 2013-06-25 NOTE — Telephone Encounter (Signed)
Follow Up ° ° ° ° °Pt calling returning your call. °

## 2013-07-06 ENCOUNTER — Encounter: Payer: Self-pay | Admitting: Cardiology

## 2013-07-06 ENCOUNTER — Ambulatory Visit (INDEPENDENT_AMBULATORY_CARE_PROVIDER_SITE_OTHER): Payer: Medicare Other | Admitting: Cardiology

## 2013-07-06 VITALS — BP 122/68 | HR 58 | Ht 70.0 in | Wt 182.1 lb

## 2013-07-06 DIAGNOSIS — N529 Male erectile dysfunction, unspecified: Secondary | ICD-10-CM

## 2013-07-06 DIAGNOSIS — Z951 Presence of aortocoronary bypass graft: Secondary | ICD-10-CM

## 2013-07-06 DIAGNOSIS — I4891 Unspecified atrial fibrillation: Secondary | ICD-10-CM

## 2013-07-06 DIAGNOSIS — I480 Paroxysmal atrial fibrillation: Secondary | ICD-10-CM

## 2013-07-06 DIAGNOSIS — I350 Nonrheumatic aortic (valve) stenosis: Secondary | ICD-10-CM

## 2013-07-06 DIAGNOSIS — I359 Nonrheumatic aortic valve disorder, unspecified: Secondary | ICD-10-CM

## 2013-07-06 HISTORY — DX: Nonrheumatic aortic (valve) stenosis: I35.0

## 2013-07-06 NOTE — Assessment & Plan Note (Signed)
The patient is back in normal sinus rhythm.  He is able to tell when he is out of rhythm.  He would prefer not to be on long-term anticoagulation.  We will stop his Xarelto at this point and restart his baby aspirin.  He will be checking his vital signs each day and if he notes recurrent atrial fibrillation he will let us know immediately.

## 2013-07-06 NOTE — Progress Notes (Signed)
Thomas Diaz Date of Birth:  16-Jun-1932 [facility] [address] [place], [place]  [address] [phone]  Fax   [phone]  HPI: This pleasant 79 year old gentleman is seen for a followup office visit.  Several weeks ago he was seen as a work in at that time was found to be in new atrial fibrillation.  He was continued on his beta blocker and Xarelto was added.  Last week he felt that he had gone back into sinus rhythm and canine and had an electrocardiogram which confirmed normal sinus rhythm.  He has had no further symptoms of irregular pulse. He has a past history of ischemic heart disease and underwent coronary artery bypass graft surgery in June of 2009. Since then he has done well with no recurrent chest discomfort. He walks daily for exercise. He has a history of essential hypertension. He's also had mild hypercholesterolemia and he is on statin therapy. He's had some urinary issues and he is no longer taking rapid flow. He has a history of erectile dysfunction and is on p.r.n. Cialis 20 mg tablet. The patient has a past history of gout. He has had no flareups since starting allopurinol.  He has been under a lot of emotional stress because his wife has Lewy body dementia.    Current Outpatient Prescriptions  Medication Sig Dispense Refill  . aspirin 81 MG tablet Take 81 mg by mouth daily.      . diazepam (VALIUM) 5 MG tablet Take 1 tablet (5 mg total) by mouth at bedtime as needed for anxiety. One tablet daily as needed  30 tablet  3  . metoprolol (LOPRESSOR) 50 MG tablet 2 TABLETS IN THE MORNING AND 1 TABLET IN THE EVENING  270 tablet  3  . nitroGLYCERIN (NITROSTAT) 0.4 MG SL tablet Place 0.4 mg under the tongue every 5 (five) minutes as needed.        . rosuvastatin (CRESTOR) 5 MG tablet Take 1 tablet (5 mg total) by mouth every other day. Three times a week  90 tablet  2  . silodosin (RAPAFLO) 8 MG CAPS capsule Take 8 mg by mouth daily with breakfast. As  needed      . tadalafil (CIALIS) 20 MG tablet Take 1 tablet (20 mg total) by mouth daily as needed.  18 tablet  3   No current facility-administered medications for this visit.    Allergies  Allergen Reactions  . Halcion [Triazolam]   . Lipitor [Atorvastatin Calcium]   . Lisinopril Cough    Patient Active Problem List   Diagnosis Date Noted  . Mild aortic stenosis 07/06/2013  . Atrial fibrillation 06/16/2013  . Hx of CABG 05/30/2011  . Gout 05/30/2011  . BPH (benign prostatic hyperplasia) 05/30/2011  . Erectile dysfunction 05/30/2011  . Benign hypertensive heart disease without heart failure 05/30/2011    History  Smoking status  . Never Smoker   Smokeless tobacco  . Not on file    History  Alcohol Use No    Family History  Problem Relation Age of Onset  . Heart attack Father   . Heart attack Brother     Review of Systems: The patient denies any heat or cold intolerance.  No weight gain or weight loss.  The patient denies headaches or blurry vision.  There is no cough or sputum production.  The patient denies dizziness.  There is no hematuria or hematochezia.  The patient denies any muscle aches or arthritis.  The patient  denies any rash.  The patient denies frequent falling or instability.  There is no history of depression or anxiety.  All other systems were reviewed and are negative.   Physical Exam: Filed Vitals:   07/06/13 1042  BP: 122/68  Pulse: 58   the general appearance reveals a well-developed well-nourished elderly gentleman in no distress.The head and neck exam reveals pupils equal and reactive.  Extraocular movements are full.  There is no scleral icterus.  The mouth and pharynx are normal.  The neck is supple.  The carotids reveal no bruits.  The jugular venous pressure is normal.  The  thyroid is not enlarged.  There is no lymphadenopathy.  The chest is clear to percussion and auscultation.  There are no rales or rhonchi.  Expansion of the chest is  symmetrical.  The precordium is quiet.  The first heart sound is normal.  The second heart sound is physiologically split.  There is no murmur gallop rub or click.  There is no abnormal lift or heave.  The abdomen is soft and nontender.  The bowel sounds are normal.  The liver and spleen are not enlarged.  There are no abdominal masses.  There are no abdominal bruits.  Extremities reveal good pedal pulses.  There is no phlebitis or edema.  There is no cyanosis or clubbing.  Strength is normal and symmetrical in all extremities.  There is no lateralizing weakness.  There are no sensory deficits.  The skin is warm and dry.  There is no rash.  EKG today confirms persisting normal sinus rhythm    Assessment / Plan: As noted above, stopped Xarelto and restart aspirin.  Recheck in 3 months for office visit and EKG.  Monitor heart rate and blood pressure carefully each day at home

## 2013-07-06 NOTE — Assessment & Plan Note (Signed)
No recurrence of chest pain or angina

## 2013-07-06 NOTE — Assessment & Plan Note (Signed)
No symptoms referable to his aortic stenosis.  He has good exercise tolerance

## 2013-07-06 NOTE — Assessment & Plan Note (Signed)
For his erectile dysfunction he is using medication that he gets from [place].  The medication is Tadalista10 mg which is a generic substitute for Cialis

## 2013-07-06 NOTE — Patient Instructions (Signed)
STOP YOUR XARELTO AND START ASPIRIN 81 MG DAILY  MONITOR YOUR VITAL SIGNS CAREFULLY DAILY, CALL IF ANY PROBLEMS  Your physician recommends that you schedule a follow-up appointment in: 3 month ov/ekg

## 2013-09-08 ENCOUNTER — Other Ambulatory Visit: Payer: Self-pay

## 2013-09-08 DIAGNOSIS — I4891 Unspecified atrial fibrillation: Secondary | ICD-10-CM

## 2013-09-08 MED ORDER — METOPROLOL TARTRATE 50 MG PO TABS: ORAL_TABLET | ORAL | Status: DC

## 2013-10-06 ENCOUNTER — Encounter (INDEPENDENT_AMBULATORY_CARE_PROVIDER_SITE_OTHER): Payer: Self-pay

## 2013-10-06 ENCOUNTER — Encounter: Payer: Self-pay | Admitting: Cardiology

## 2013-10-06 ENCOUNTER — Ambulatory Visit (INDEPENDENT_AMBULATORY_CARE_PROVIDER_SITE_OTHER): Payer: Medicare Other | Admitting: Cardiology

## 2013-10-06 VITALS — BP 158/80 | HR 60 | Wt 184.0 lb

## 2013-10-06 DIAGNOSIS — I350 Nonrheumatic aortic (valve) stenosis: Secondary | ICD-10-CM

## 2013-10-06 DIAGNOSIS — Z951 Presence of aortocoronary bypass graft: Secondary | ICD-10-CM

## 2013-10-06 DIAGNOSIS — I359 Nonrheumatic aortic valve disorder, unspecified: Secondary | ICD-10-CM

## 2013-10-06 DIAGNOSIS — I480 Paroxysmal atrial fibrillation: Secondary | ICD-10-CM

## 2013-10-06 DIAGNOSIS — I4891 Unspecified atrial fibrillation: Secondary | ICD-10-CM

## 2013-10-06 DIAGNOSIS — I119 Hypertensive heart disease without heart failure: Secondary | ICD-10-CM

## 2013-10-06 NOTE — Assessment & Plan Note (Signed)
The patient has not had any recurrent atrial fibrillation.  No TIA or stroke symptoms.  Continue aspirin

## 2013-10-06 NOTE — Assessment & Plan Note (Signed)
Blood pressure is variable.  He checks it at home.  Today after arriving here was slightly elevated.

## 2013-10-06 NOTE — Assessment & Plan Note (Signed)
The patient is not having any symptoms referable to his aortic stenosis.  His peak gradient was 25 with a mean gradient of 15.  He has had an heart murmur since age 79

## 2013-10-06 NOTE — Progress Notes (Signed)
Candelaria Celeste Date of Birth:  03-Feb-1932 [address] [place], [place]  [address] [phone]  Fax   [phone]  HPI: This pleasant 79 year old gentleman is seen for a followup office visit.  Several months ago he was seen as a work in at that time was found to be in new atrial fibrillation.  He was continued on his beta blocker and Xarelto was added.  Last week he felt that he had gone back into sinus rhythm  and had an electrocardiogram which confirmed normal sinus rhythm.  He has had no further symptoms of irregular pulse. He has a past history of ischemic heart disease and underwent coronary artery bypass graft surgery in June of 2009. Since then he has done well with no recurrent chest discomfort. He walks daily for exercise. He has a history of essential hypertension. He's also had mild hypercholesterolemia and he is on statin therapy. He's had some urinary issues and he is no longer taking rapid flow. He has a history of erectile dysfunction and is on p.r.n. Cialis 20 mg tablet. The patient has a past history of gout. He has had no flareups since starting allopurinol.  He has been under a lot of emotional stress because his wife has Lewy body dementia.    Current Outpatient Prescriptions  Medication Sig Dispense Refill  . aspirin 81 MG tablet Take 81 mg by mouth daily.      . diazepam (VALIUM) 5 MG tablet Take 1 tablet (5 mg total) by mouth at bedtime as needed for anxiety. One tablet daily as needed  30 tablet  3  . metoprolol (LOPRESSOR) 50 MG tablet Take 50 mg by mouth 3 (three) times daily.      . nitroGLYCERIN (NITROSTAT) 0.4 MG SL tablet Place 0.4 mg under the tongue every 5 (five) minutes as needed.        . rosuvastatin (CRESTOR) 5 MG tablet TWO times a week      . silodosin (RAPAFLO) 8 MG CAPS capsule Take 8 mg by mouth daily with breakfast. As needed      . tadalafil (CIALIS) 20 MG tablet Take 1 tablet (20 mg total) by mouth daily as needed.  18 tablet  3     No current facility-administered medications for this visit.    Allergies  Allergen Reactions  . Halcion [Triazolam]   . Lipitor [Atorvastatin Calcium]   . Lisinopril Cough    Patient Active Problem List   Diagnosis Date Noted  . Mild aortic stenosis 07/06/2013  . Atrial fibrillation 06/16/2013  . Hx of CABG 05/30/2011  . Gout 05/30/2011  . BPH (benign prostatic hyperplasia) 05/30/2011  . Erectile dysfunction 05/30/2011  . Benign hypertensive heart disease without heart failure 05/30/2011    History  Smoking status  . Never Smoker   Smokeless tobacco  . Not on file    History  Alcohol Use No    Family History  Problem Relation Age of Onset  . Heart attack Father   . Heart attack Brother     Review of Systems: The patient denies any heat or cold intolerance.  No weight gain or weight loss.  The patient denies headaches or blurry vision.  There is no cough or sputum production.  The patient denies dizziness.  There is no hematuria or hematochezia.  The patient denies any muscle aches or arthritis.  The patient denies any rash.  The patient denies frequent falling or instability.  There is no  history of depression or anxiety.  All other systems were reviewed and are negative.   Physical Exam: Filed Vitals:   10/06/13 1406  BP: 158/80  Pulse: 60   the general appearance reveals a well-developed well-nourished elderly gentleman in no distress.The head and neck exam reveals pupils equal and reactive.  Extraocular movements are full.  There is no scleral icterus.  The mouth and pharynx are normal.  The neck is supple.  The carotids reveal no bruits.  The jugular venous pressure is normal.  The  thyroid is not enlarged.  There is no lymphadenopathy.  The chest is clear to percussion and auscultation.  There are no rales or rhonchi.  Expansion of the chest is symmetrical.  The precordium is quiet.  The first heart sound is normal.  The second heart sound is physiologically  split.  There is no murmur gallop rub or click.  There is no abnormal lift or heave.  The abdomen is soft and nontender.  The bowel sounds are normal.  The liver and spleen are not enlarged.  There are no abdominal masses.  There are no abdominal bruits.  Extremities reveal good pedal pulses.  There is no phlebitis or edema.  There is no cyanosis or clubbing.  Strength is normal and symmetrical in all extremities.  There is no lateralizing weakness.  There are no sensory deficits.  The skin is warm and dry.  There is no rash.  EKG today confirms persisting normal sinus rhythm.  There is borderline first degree heart block with a PR interval of 210 ms.  There are chronic nonspecific ST-T wave changes    Assessment / Plan: Continue on same medication except decreased Crestor down to just 2 days a week.  He is concerned because a lot of his friends have developed myalgias.  He states that his LDLs recently have been in the 60s or 70s.  We have not done lab work here for more than a year because he has [facility] blood tests [facility]. He feels that the 100 mg Lopressor in the morning makes him lethargic.  We will change the timing of his Lopressor to 50 mg 3 times a day with meals. Recheck in 6 months for followup office visit EKG and we will get fasting lipid panel hepatic function panel and basal metabolic panel here ahead of time.

## 2013-10-06 NOTE — Patient Instructions (Addendum)
DECREASE YOUR CRESTOR TO 5 MG TWO TIMES A WEEK  CHANGE YOUR LOPRESSOR (METOPROLOL) TO 50 MG THREE TIMES A DAY  Your physician wants you to follow-up in: 6 months with fasting labs (lp/bmet/hfp) AND EKG  You will receive a reminder letter in the mail two months in advance. If you don't receive a letter, please call our office to schedule the follow-up appointment.

## 2013-10-06 NOTE — Assessment & Plan Note (Signed)
Patient walks 3 miles a day for exercise.  He has not been having any angina.

## 2014-01-06 DIAGNOSIS — J309 Allergic rhinitis, unspecified: Secondary | ICD-10-CM | POA: Insufficient documentation

## 2014-03-18 DIAGNOSIS — I10 Essential (primary) hypertension: Secondary | ICD-10-CM | POA: Insufficient documentation

## 2014-03-18 DIAGNOSIS — I2510 Atherosclerotic heart disease of native coronary artery without angina pectoris: Secondary | ICD-10-CM | POA: Insufficient documentation

## 2014-03-18 DIAGNOSIS — E785 Hyperlipidemia, unspecified: Secondary | ICD-10-CM | POA: Insufficient documentation

## 2014-03-25 ENCOUNTER — Ambulatory Visit (INDEPENDENT_AMBULATORY_CARE_PROVIDER_SITE_OTHER): Payer: Medicare Other | Admitting: Cardiology

## 2014-03-25 ENCOUNTER — Encounter: Payer: Self-pay | Admitting: Cardiology

## 2014-03-25 ENCOUNTER — Encounter (INDEPENDENT_AMBULATORY_CARE_PROVIDER_SITE_OTHER): Payer: Self-pay

## 2014-03-25 VITALS — BP 154/82 | HR 66 | Ht 70.0 in | Wt 186.0 lb

## 2014-03-25 DIAGNOSIS — I119 Hypertensive heart disease without heart failure: Secondary | ICD-10-CM

## 2014-03-25 DIAGNOSIS — I4891 Unspecified atrial fibrillation: Secondary | ICD-10-CM

## 2014-03-25 DIAGNOSIS — I359 Nonrheumatic aortic valve disorder, unspecified: Secondary | ICD-10-CM

## 2014-03-25 DIAGNOSIS — I350 Nonrheumatic aortic (valve) stenosis: Secondary | ICD-10-CM

## 2014-03-25 DIAGNOSIS — Z951 Presence of aortocoronary bypass graft: Secondary | ICD-10-CM

## 2014-03-25 DIAGNOSIS — I480 Paroxysmal atrial fibrillation: Secondary | ICD-10-CM

## 2014-03-25 NOTE — Assessment & Plan Note (Signed)
He walks 3-1/2 miles several days a week.  He does not have any exertional chest pain.

## 2014-03-25 NOTE — Assessment & Plan Note (Signed)
Blood pressure here is slightly elevated today.  He has some element of white coat syndrome.  At home his blood pressure is in the 130/80 range.

## 2014-03-25 NOTE — Patient Instructions (Signed)
Your physician recommends that you continue on your current medications as directed. Please refer to the Current Medication list given to you today.  Your physician wants you to follow-up in: 6 month ov/ekg You will receive a reminder letter in the mail two months in advance. If you don't receive a letter, please call our office to schedule the follow-up appointment.  

## 2014-03-25 NOTE — Progress Notes (Signed)
Thomas Diaz Date of Birth:  21-Oct-1932 [facility] [address] [place], [place]  [address] [phone]        Fax   [phone]   History of Present Illness: This pleasant 80 year old gentleman is seen for a followup office visit.  One year ago he was found to be in new atrial fibrillation. He was continued on his beta blocker and Xarelto was added.  He converted spontaneously back into sinus rhythm and had an electrocardiogram which confirmed normal sinus rhythm. He has had no further symptoms of irregular pulse.  He had an echocardiogram on 06/19/13 which showed an ejection fraction of 55-60% and showed mild aortic valve stenosis with a peak gradient of 25 and a mean gradient of 15. He has a past history of ischemic heart disease and underwent coronary artery bypass graft surgery in June of 2009. Since then he has done well with no recurrent chest discomfort. He walks daily for exercise. He has a history of essential hypertension. He's also had mild hypercholesterolemia and he is on statin therapy. He's had some urinary issues and he is no longer taking rapid flow. He has a history of erectile dysfunction and is on p.r.n. Cialis 20 mg tablet. The patient has a past history of gout. He has had no flareups since starting allopurinol. He has been under a lot of emotional stress because his wife has Lewy body dementia.    Current Outpatient Prescriptions  Medication Sig Dispense Refill  . aspirin 81 MG tablet Take 81 mg by mouth daily.      . diazepam (VALIUM) 5 MG tablet Take 1 tablet (5 mg total) by mouth at bedtime as needed for anxiety. One tablet daily as needed  30 tablet  3  . metoprolol (LOPRESSOR) 50 MG tablet Take 50 mg by mouth 3 (three) times daily.      . nitroGLYCERIN (NITROSTAT) 0.4 MG SL tablet Place 0.4 mg under the tongue every 5 (five) minutes as needed.        . rosuvastatin (CRESTOR) 5 MG tablet TWO times a week      . silodosin (RAPAFLO) 8 MG  CAPS capsule Take 8 mg by mouth daily with breakfast. As needed      . tadalafil (CIALIS) 20 MG tablet Take 1 tablet (20 mg total) by mouth daily as needed.  18 tablet  3   No current facility-administered medications for this visit.    Allergies  Allergen Reactions  . Halcion [Triazolam]   . Lipitor [Atorvastatin Calcium]   . Lisinopril Cough    Patient Active Problem List   Diagnosis Date Noted  . Mild aortic stenosis 07/06/2013  . Atrial fibrillation 06/16/2013  . Hx of CABG 05/30/2011  . Gout 05/30/2011  . BPH (benign prostatic hyperplasia) 05/30/2011  . Erectile dysfunction 05/30/2011  . Benign hypertensive heart disease without heart failure 05/30/2011    History  Smoking status  . Never Smoker   Smokeless tobacco  . Not on file    History  Alcohol Use No    Family History  Problem Relation Age of Onset  . Heart attack Father   . Heart attack Brother     Review of Systems: Constitutional: no fever chills diaphoresis or fatigue or change in weight.  Head and neck: no hearing loss, no epistaxis, no photophobia or visual disturbance. Respiratory: No cough, shortness of breath or wheezing. Cardiovascular: No chest pain peripheral edema, palpitations. Gastrointestinal: No abdominal distention,  no abdominal pain, no change in bowel habits hematochezia or melena. Genitourinary: No dysuria, no frequency, no urgency, no nocturia. Musculoskeletal:No arthralgias, no back pain, no gait disturbance or myalgias. Neurological: No dizziness, no headaches, no numbness, no seizures, no syncope, no weakness, no tremors. Hematologic: No lymphadenopathy, no easy bruising. Psychiatric: No confusion, no hallucinations, no sleep disturbance.    Physical Exam: Filed Vitals:   03/25/14 0902  BP: 154/82  Pulse: 66   the general appearance reveals a well-developed well-nourished gentleman in no distress.The head and neck exam reveals pupils equal and reactive.  Extraocular  movements are full.  There is no scleral icterus.  The mouth and pharynx are normal.  The neck is supple.  The carotids reveal no bruits.  The jugular venous pressure is normal.  The  thyroid is not enlarged.  There is no lymphadenopathy.  The chest is clear to percussion and auscultation.  There are no rales or rhonchi.  Expansion of the chest is symmetrical.  The precordium is quiet.  The first heart sound is normal.  The second heart sound is physiologically split.  There is no  gallop rub or click.  There is a grade 2/6 systolic murmur at the aortic area.  No diastolic murmur. There is no abnormal lift or heave.  The abdomen is soft and nontender.  The bowel sounds are normal.  The liver and spleen are not enlarged.  There are no abdominal masses.  There are no abdominal bruits.  Extremities reveal good pedal pulses.  There is no phlebitis or edema.  There is no cyanosis or clubbing.  Strength is normal and symmetrical in all extremities.  There is no lateralizing weakness.  There are no sensory deficits.  The skin is warm and dry.  There is no rash.     Assessment / Plan: 1. ischemic heart disease status post CABG 2009 2. paroxysmal atrial fibrillation.  No recurrence 3. essential hypertension 4. Hypercholesterolemia 5. erectile dysfunction 6.  History of occasional gout  Plan: Continue same medication.  Recheck in 6 months for office visit and EKG. He brought in a lab work from his recent physical examination dated 03/17/14.  His lipids are satisfactory.  [place]

## 2014-03-25 NOTE — Assessment & Plan Note (Signed)
Patient has not had any further episodes of atrial fibrillation.  He is not on long-term anticoagulation.  He takes baby aspirin daily.

## 2014-03-25 NOTE — Assessment & Plan Note (Signed)
No symptoms from his mild aortic stenosis. 

## 2014-04-29 ENCOUNTER — Other Ambulatory Visit: Payer: Self-pay | Admitting: *Deleted

## 2014-04-29 DIAGNOSIS — G4700 Insomnia, unspecified: Secondary | ICD-10-CM

## 2014-04-29 MED ORDER — DIAZEPAM 5 MG PO TABS
ORAL_TABLET | ORAL | Status: DC
Start: 2014-04-29 — End: 2014-11-09

## 2014-09-29 ENCOUNTER — Other Ambulatory Visit (INDEPENDENT_AMBULATORY_CARE_PROVIDER_SITE_OTHER): Payer: Medicare Other | Admitting: *Deleted

## 2014-09-29 DIAGNOSIS — I119 Hypertensive heart disease without heart failure: Secondary | ICD-10-CM

## 2014-09-29 LAB — LIPID PANEL
CHOL/HDL RATIO: 3
Cholesterol: 158 mg/dL (ref 0–200)
HDL: 50.9 mg/dL (ref >39.00)
LDL Cholesterol: 91 mg/dL (ref 0–99)
NonHDL: 107.1
TRIGLYCERIDES: 82 mg/dL (ref 0.0–149.0)
VLDL: 16.4 mg/dL (ref 0.0–40.0)

## 2014-09-29 LAB — BASIC METABOLIC PANEL
BUN: 20 mg/dL (ref 6–23)
CALCIUM: 9.1 mg/dL (ref 8.4–10.5)
CHLORIDE: 108 meq/L (ref 96–112)
CO2: 21 meq/L (ref 19–32)
CREATININE: 1 mg/dL (ref 0.4–1.5)
GFR: 78.09 mL/min (ref >60.00)
Glucose, Bld: 98 mg/dL (ref 70–99)
Potassium: 4.1 mEq/L (ref 3.5–5.1)
SODIUM: 142 meq/L (ref 135–145)

## 2014-09-29 LAB — HEPATIC FUNCTION PANEL
ALBUMIN: 3.7 g/dL (ref 3.5–5.2)
ALK PHOS: 48 U/L (ref 39–117)
ALT: 22 U/L (ref 0–53)
AST: 24 U/L (ref 0–37)
Bilirubin, Direct: 0.2 mg/dL (ref 0.0–0.3)
TOTAL PROTEIN: 7.3 g/dL (ref 6.0–8.3)
Total Bilirubin: 2 mg/dL — ABNORMAL HIGH (ref 0.2–1.2)

## 2014-09-29 NOTE — Progress Notes (Signed)
Quick Note:  Please make copy of labs for patient visit. ______ 

## 2014-10-01 ENCOUNTER — Ambulatory Visit (INDEPENDENT_AMBULATORY_CARE_PROVIDER_SITE_OTHER): Payer: Medicare Other | Admitting: Cardiology

## 2014-10-01 ENCOUNTER — Encounter: Payer: Self-pay | Admitting: Cardiology

## 2014-10-01 VITALS — BP 162/84 | HR 59 | Ht 70.0 in | Wt 183.0 lb

## 2014-10-01 DIAGNOSIS — I480 Paroxysmal atrial fibrillation: Secondary | ICD-10-CM

## 2014-10-01 DIAGNOSIS — Z951 Presence of aortocoronary bypass graft: Secondary | ICD-10-CM

## 2014-10-01 DIAGNOSIS — I119 Hypertensive heart disease without heart failure: Secondary | ICD-10-CM

## 2014-10-01 DIAGNOSIS — I350 Nonrheumatic aortic (valve) stenosis: Secondary | ICD-10-CM

## 2014-10-01 NOTE — Progress Notes (Signed)
Thomas Diaz Date of Birth:  [birth year] [facility] [address] [place], [place]  [address] [phone]        Fax   [phone]   History of Present Illness: This pleasant [age] year old gentleman is seen for a followup office visit.  One year ago he was found to be in new atrial fibrillation. He was continued on his beta blocker and Xarelto was added.  He converted spontaneously back into sinus rhythm and had an electrocardiogram which confirmed normal sinus rhythm. He has had no further symptoms of irregular pulse.  He had an echocardiogram on 06/19/13 which showed an ejection fraction of 55-60% and showed mild aortic valve stenosis with a peak gradient of 25 and a mean gradient of 15. He has a past history of ischemic heart disease and underwent coronary artery bypass graft surgery in June of 2009. Since then he has done well with no recurrent chest discomfort. He walks daily for exercise. He has a history of essential hypertension. He's also had mild hypercholesterolemia and he is on statin therapy. . He has a history of erectile dysfunction and is on p.r.n. Cialis 20 mg tablet. The patient has a past history of gout. He has had no flareups since starting allopurinol. He has been under a lot of emotional stress because his wife has  dementia. He and his wife live out at [place].   Current Outpatient Prescriptions  Medication Sig Dispense Refill  . aspirin 81 MG tablet Take 81 mg by mouth daily.    . diazepam (VALIUM) 5 MG tablet One tablet daily as needed at bedtime for rest 30 tablet 3  . metoprolol (LOPRESSOR) 50 MG tablet Take 50 mg by mouth 3 (three) times daily.    . nitroGLYCERIN (NITROSTAT) 0.4 MG SL tablet Place 0.4 mg under the tongue every 5 (five) minutes as needed.      . rosuvastatin (CRESTOR) 5 MG tablet FOUR times a week    . silodosin (RAPAFLO) 8 MG CAPS capsule Take 8 mg by mouth as needed (difficulty urination). As needed    . tadalafil  (CIALIS) 20 MG tablet Take 1 tablet (20 mg total) by mouth daily as needed. 18 tablet 3   No current facility-administered medications for this visit.    Allergies  Allergen Reactions  . Halcion [Triazolam] Other (See Comments)    Unknown   . Lipitor [Atorvastatin Calcium] Other (See Comments)    Unknown   . Lisinopril Cough    cough     Patient Active Problem List   Diagnosis Date Noted  . Mild aortic stenosis 07/06/2013  . Atrial fibrillation 06/16/2013  . Hx of CABG 05/30/2011  . Gout 05/30/2011  . BPH (benign prostatic hyperplasia) 05/30/2011  . Erectile dysfunction 05/30/2011  . Benign hypertensive heart disease without heart failure 05/30/2011    History  Smoking status  . Never Smoker   Smokeless tobacco  . Not on file    History  Alcohol Use No    Family History  Problem Relation Age of Onset  . Heart attack Father   . Heart attack Brother     Review of Systems: Constitutional: no fever chills diaphoresis or fatigue or change in weight.  Head and neck: no hearing loss, no epistaxis, no photophobia or visual disturbance. Respiratory: No cough, shortness of breath or wheezing. Cardiovascular: No chest pain peripheral edema, palpitations. Gastrointestinal: No abdominal distention, no abdominal pain, no change in bowel habits hematochezia  or melena. Genitourinary: No dysuria, no frequency, no urgency, no nocturia. Musculoskeletal:No arthralgias, no back pain, no gait disturbance or myalgias. Neurological: No dizziness, no headaches, no numbness, no seizures, no syncope, no weakness, no tremors. Hematologic: No lymphadenopathy, no easy bruising. Psychiatric: No confusion, no hallucinations, no sleep disturbance.    Physical Exam: Filed Vitals:   10/01/14 1116  BP: 162/84  Pulse: 59   the general appearance reveals a well-developed well-nourished gentleman in no distress.The head and neck exam reveals pupils equal and reactive.  Extraocular movements  are full.  There is no scleral icterus.  The mouth and pharynx are normal.  The neck is supple.  The carotids reveal no bruits.  The jugular venous pressure is normal.  The  thyroid is not enlarged.  There is no lymphadenopathy.  The chest is clear to percussion and auscultation.  There are no rales or rhonchi.  Expansion of the chest is symmetrical.  The precordium is quiet.  The first heart sound is normal.  The second heart sound is physiologically split.  There is no  gallop rub or click.  There is a grade 2/6 systolic murmur at the aortic area.  No diastolic murmur. There is no abnormal lift or heave.  The abdomen is soft and nontender.  The bowel sounds are normal.  The liver and spleen are not enlarged.  There are no abdominal masses.  There are no abdominal bruits.  Extremities reveal good pedal pulses.  There is no phlebitis or edema.  There is no cyanosis or clubbing.  Strength is normal and symmetrical in all extremities.  There is no lateralizing weakness.  There are no sensory deficits.  The skin is warm and dry.  There is no rash.  EKG shows normal sinus rhythm and there are inferolateral ST and T-wave abnormalities.  The inferior wall changes are new since 10/06/13.   Assessment / Plan: 1. ischemic heart disease status post CABG 2009. New inferior wall EKG changes noted today. 2. paroxysmal atrial fibrillation.  No recurrence 3. essential hypertension 4. Hypercholesterolemia 5. erectile dysfunction 6.  History of occasional gout  Plan: the patient is to return for a treadmill Myoview stress test. We reviewed his labs.  His LDL is no longer at target.  He has been taking Crestor only 2 days a week.  We will want him to increase Crestor up to 4 days a week. Recheck in 6 months for office visit lipid panel hepatic function panel and basal metabolic panel.

## 2014-10-01 NOTE — Assessment & Plan Note (Signed)
The patient has not been having any recurrent chest pain.  His EKG today however shows new inferior wall T-wave inversion.  We will have him return for a treadmill Myoview stress test.  He has not had stress testing since his CABG in 2009.

## 2014-10-01 NOTE — Assessment & Plan Note (Signed)
Blood pressure here in the office today was elevated.  He attributes to white coat syndrome.  At home his blood pressure has been normal.no symptoms of dizziness syncope or shortness of breath.

## 2014-10-01 NOTE — Assessment & Plan Note (Signed)
The patient has not had any recurrence of atrial fibrillation.

## 2014-10-01 NOTE — Assessment & Plan Note (Signed)
No symptoms from his aortic stenosis which is mild.

## 2014-10-01 NOTE — Patient Instructions (Signed)
INCREASE YOUR CRESTOR TO 4 DAYS A WEEK   Your physician has requested that you have en exercise stress myoview. For further information please visit [email or web address]. Please follow instruction sheet, as given.  Your physician wants you to follow-up in: 6 months with fasting labs (lp/bmet/hfp)  You will receive a reminder letter in the mail two months in advance. If you don't receive a letter, please call our office to schedule the follow-up appointment.

## 2014-10-11 ENCOUNTER — Encounter: Payer: Self-pay | Admitting: *Deleted

## 2014-10-29 ENCOUNTER — Ambulatory Visit (HOSPITAL_COMMUNITY): Payer: Medicare Other | Attending: Cardiology | Admitting: Radiology

## 2014-10-29 DIAGNOSIS — I350 Nonrheumatic aortic (valve) stenosis: Secondary | ICD-10-CM | POA: Insufficient documentation

## 2014-10-29 DIAGNOSIS — R0600 Dyspnea, unspecified: Secondary | ICD-10-CM | POA: Diagnosis not present

## 2014-10-29 DIAGNOSIS — I10 Essential (primary) hypertension: Secondary | ICD-10-CM | POA: Diagnosis not present

## 2014-10-29 DIAGNOSIS — Z4509 Encounter for adjustment and management of other cardiac device: Secondary | ICD-10-CM | POA: Insufficient documentation

## 2014-10-29 DIAGNOSIS — Z951 Presence of aortocoronary bypass graft: Secondary | ICD-10-CM | POA: Insufficient documentation

## 2014-10-29 DIAGNOSIS — Z6826 Body mass index (BMI) 26.0-26.9, adult: Secondary | ICD-10-CM | POA: Diagnosis not present

## 2014-10-29 DIAGNOSIS — I2510 Atherosclerotic heart disease of native coronary artery without angina pectoris: Secondary | ICD-10-CM | POA: Insufficient documentation

## 2014-10-29 DIAGNOSIS — I119 Hypertensive heart disease without heart failure: Secondary | ICD-10-CM

## 2014-10-29 DIAGNOSIS — I4891 Unspecified atrial fibrillation: Secondary | ICD-10-CM | POA: Insufficient documentation

## 2014-10-29 DIAGNOSIS — R9431 Abnormal electrocardiogram [ECG] [EKG]: Secondary | ICD-10-CM | POA: Insufficient documentation

## 2014-10-29 MED ORDER — TECHNETIUM TC 99M SESTAMIBI GENERIC - CARDIOLITE
33.0000 | Freq: Once | INTRAVENOUS | Status: AC | PRN
  Administered 2014-10-29: 33 via INTRAVENOUS

## 2014-10-29 MED ORDER — TECHNETIUM TC 99M SESTAMIBI GENERIC - CARDIOLITE
11.0000 | Freq: Once | INTRAVENOUS | Status: AC | PRN
  Administered 2014-10-29: 11 via INTRAVENOUS

## 2014-10-29 NOTE — Progress Notes (Signed)
[facility] SITE 3 NUCLEAR MED [address] [place], [place] [address] [phone]    Cardiology Nuclear Med Study  Thomas CelesteLester C Diaz is a 80 y.o. male     MRN : [id number]     DOB: [birth year]  Procedure Date: 10/29/2014  Nuclear Med Background Indication for Stress Test:  Evaluation for Ischemia, Graft Patency and Abnormal EKG History: CAD, CABG, AFIB, and mild AS Cardiac Risk Factors: Hypertension  Symptoms:  DOE   Nuclear Pre-Procedure Caffeine/Decaff Intake:  None> 12 hrs NPO After: 6:00pm   Lungs:  clear O2 Sat: 97% on room air. IV 0.9% NS with Angio Cath:  22g  IV Site: R Wrist x 1, tolerated well IV Started by:  Irean HongPatsy Joshalyn Ancheta, RN  Chest Size (in):  42 Cup Size: n/a  Height: 5\' 10"  (1.778 m)  Weight:  185 lb (83.915 kg)  BMI:  Body mass index is 26.54 kg/(m^2). Tech Comments:  Patient held Metoprolol x 24 hrs. Irean HongPatsy Shirleyann Montero, RN.   Patient took his metoprolol after recovery. Irean HongPatsy Tylie Golonka, RN.    Nuclear Med Study 1 or 2 day study: 1 day  Stress Test Type:  Stress  Reading MD: N/A  Order Authorizing Provider:  Cassell Clementhomas Brackbill, MD  Resting Radionuclide: Technetium 4135m Sestamibi  Resting Radionuclide Dose: 11.0 mCi   Stress Radionuclide:  Technetium 6935m Sestamibi  Stress Radionuclide Dose: 33.0 mCi           Stress Protocol Rest HR: 81 Stress HR: 142  Rest BP: 171/91 Stress BP: 191/93  Exercise Time (min): 4:01 METS: 5.8   Predicted Max HR: 140 bpm % Max HR: 101.43 bpm Rate Pressure Product: [id number]   Dose of Adenosine (mg):  n/a Dose of Lexiscan: n/a mg  Dose of Atropine (mg): n/a Dose of Dobutamine: n/a mcg/kg/min (at max HR)  Stress Test Technologist: Irean HongPatsy Larenda Reedy, RN  Nuclear Technologist:  Kerby NoraElzbieta Kubak, CNMT     Rest Procedure:  Myocardial perfusion imaging was performed at rest 45 minutes following the intravenous administration of Technetium 9535m Sestamibi. Rest ECG: Normal sinus rhythm with multiple PACs. Diffuse ST flattening. Inverted T  waves in V1 to V4  Stress Procedure:  The patient exercised on the treadmill utilizing the Bruce Protocol for 4:01 minutes, RPE=15. The patient stopped due to DOE and denied any chest pain.There was a mild hypertensive response to exercise.  Technetium 4235m Sestamibi was injected at peak exercise and myocardial perfusion imaging was performed after a brief delay. Stress ECG: With stress there is increase in the resting ST flattening and depression and T-wave inversions. This is nondiagnostic because the resting EKG is abnormal.  QPS Raw Data Images:  Normal; no motion artifact; normal heart/lung ratio. Stress Images:  There is a small area of mild decreased uptake on the quantitative analysis. This is seen in the apical lateral segment in the mid anterolateral segment. This is fixed. I doubt that this represents a significant finding. Rest Images:  The rest image is the same as the stress image. Subtraction (SDS):  No evidence of ischemia. Transient Ischemic Dilatation (Normal <1.22):  1.09 Lung/Heart Ratio (Normal <0.45):  0.31  Quantitative Gated Spect Images QGS EDV:  81 ml QGS ESV:  35 ml  Impression Exercise Capacity:  Fairly good exercise capacity for an 80 year old patient. BP Response:  Normal blood pressure response. Clinical Symptoms:  There was dyspnea with exertion ECG Impression:  The resting EKG is abnormal. The stress he EKG shows more marked ST depression and  T-wave inversion. This is a nondiagnostic finding. Comparison with Prior Nuclear Study: No images to compare  Overall Impression:  The study reveals no diagnostic abnormality. There is no scar or ischemia. There is normal wall motion. The EKG is abnormal at rest and becomes more abnormal with stress. However the nuclear images reveal no abnormality. This is a low risk scan.  LV Ejection Fraction: 56%.  LV Wall Motion:  Normal Wall Motion.  Willa RoughJeffrey Katz, MD

## 2014-11-03 ENCOUNTER — Other Ambulatory Visit: Payer: Self-pay | Admitting: *Deleted

## 2014-11-03 DIAGNOSIS — I10 Essential (primary) hypertension: Secondary | ICD-10-CM

## 2014-11-03 NOTE — Telephone Encounter (Signed)
-----   Message from Cassell Clementhomas Brackbill, MD sent at 11/01/2014  1:36 PM EST ----- Please report.  The stress Myoview was a low risk scan.  There was no evidence of ischemia.  His ejection fraction was normal. His blood pressure has been running high intermittently and when he comes to the doctor.  For long-term vascular protection and blood pressure modulation I would like him to add losartan 25 mg one daily.

## 2014-11-04 MED ORDER — LOSARTAN POTASSIUM 25 MG PO TABS: 25.0000 mg | ORAL_TABLET | Freq: Every day | ORAL | Status: DC

## 2014-11-04 NOTE — Telephone Encounter (Signed)
The allergy to lisinopril was cough.  Hopefully he will not have the same problem with an ARB.  The incidence of cough is less with ARB.

## 2014-11-09 ENCOUNTER — Other Ambulatory Visit: Payer: Self-pay | Admitting: *Deleted

## 2014-11-09 DIAGNOSIS — G4700 Insomnia, unspecified: Secondary | ICD-10-CM

## 2014-11-09 DIAGNOSIS — F419 Anxiety disorder, unspecified: Secondary | ICD-10-CM

## 2014-11-09 MED ORDER — DIAZEPAM 5 MG PO TABS: ORAL_TABLET | ORAL | Status: DC

## 2014-12-30 ENCOUNTER — Telehealth: Payer: Self-pay | Admitting: Cardiology

## 2014-12-30 DIAGNOSIS — I10 Essential (primary) hypertension: Secondary | ICD-10-CM

## 2014-12-30 NOTE — Telephone Encounter (Signed)
Patient currently taking Losartan, per  Dr. Patty SermonsBrackbill increase to Losartan 50 mg daily Left message (per patient request) and to call back Monday with update on blood pressure readings

## 2014-12-30 NOTE — Telephone Encounter (Signed)
Left message to call back  

## 2014-12-30 NOTE — Telephone Encounter (Signed)
Add losartan 25 mg one daily to his regimen to help control blood pressure spikes.  Check a basal metabolic panel 1 week after starting.

## 2014-12-30 NOTE — Telephone Encounter (Signed)
Spoke with patient and he is concerned about his blood pressure going up for a few days Patient was not feeling well a few days ago and took blood pressure and was up to 190/90 He did sit down and relax, was able to get blood pressure down to 140's/80's Patient just concerned over the 140's/80's he had for few days which is unusual for him Weight normally 179 but was up to 184, thinks he retains fluid Today blood pressure 127/73 after he came back from exercising, weight today 179 Patient denies any change in diet  Concerned with what to do if goes up again Will forward to  Dr. Patty SermonsBrackbill for review

## 2014-12-30 NOTE — Telephone Encounter (Signed)
New Message  Pt requested to speak w/ Rn concerning medications- no more specific. Can use cell # until 10, then home # is fine. Please call back and discuss.

## 2015-01-07 ENCOUNTER — Encounter: Payer: Self-pay | Admitting: Cardiology

## 2015-01-07 MED ORDER — LOSARTAN POTASSIUM 50 MG PO TABS: 50.0000 mg | ORAL_TABLET | Freq: Every day | ORAL | Status: DC

## 2015-01-07 NOTE — Telephone Encounter (Signed)
Agree with advice given

## 2015-01-07 NOTE — Telephone Encounter (Signed)
Discussed with patient and his blood pressure is running 140's after exercise and down in 110's at rest Information only  Advised to continue same dose of medications       Thomas SacramentoDonna M Price at 01/07/2015 9:30 AM     Status: Signed       Expand All Collapse All   New message      Pt states he is returning Omri Bertran's call

## 2015-01-07 NOTE — Telephone Encounter (Signed)
New message ° ° ° ° ° °Pt states he is returning Melinda's call °

## 2015-01-07 NOTE — Telephone Encounter (Signed)
This encounter was created in error - please disregard.

## 2015-04-06 ENCOUNTER — Other Ambulatory Visit: Payer: Self-pay | Admitting: *Deleted

## 2015-04-06 MED ORDER — ROSUVASTATIN CALCIUM 5 MG PO TABS: 5.0000 mg | ORAL_TABLET | ORAL | Status: DC

## 2015-04-13 ENCOUNTER — Other Ambulatory Visit (INDEPENDENT_AMBULATORY_CARE_PROVIDER_SITE_OTHER): Payer: Medicare Other

## 2015-04-13 ENCOUNTER — Ambulatory Visit (INDEPENDENT_AMBULATORY_CARE_PROVIDER_SITE_OTHER): Payer: Medicare Other | Admitting: Cardiology

## 2015-04-13 ENCOUNTER — Encounter: Payer: Self-pay | Admitting: Cardiology

## 2015-04-13 VITALS — BP 140/86 | HR 66 | Ht 70.0 in | Wt 181.8 lb

## 2015-04-13 DIAGNOSIS — I10 Essential (primary) hypertension: Secondary | ICD-10-CM

## 2015-04-13 DIAGNOSIS — I119 Hypertensive heart disease without heart failure: Secondary | ICD-10-CM

## 2015-04-13 DIAGNOSIS — N401 Enlarged prostate with lower urinary tract symptoms: Secondary | ICD-10-CM | POA: Diagnosis not present

## 2015-04-13 DIAGNOSIS — I350 Nonrheumatic aortic (valve) stenosis: Secondary | ICD-10-CM

## 2015-04-13 DIAGNOSIS — Z951 Presence of aortocoronary bypass graft: Secondary | ICD-10-CM | POA: Diagnosis not present

## 2015-04-13 DIAGNOSIS — I480 Paroxysmal atrial fibrillation: Secondary | ICD-10-CM

## 2015-04-13 DIAGNOSIS — R351 Nocturia: Secondary | ICD-10-CM | POA: Diagnosis not present

## 2015-04-13 LAB — CBC WITH DIFFERENTIAL/PLATELET
BASOS ABS: 0 10*3/uL (ref 0.0–0.1)
BASOS PCT: 0.5 % (ref 0.0–3.0)
EOS ABS: 0.2 10*3/uL (ref 0.0–0.7)
Eosinophils Relative: 2.4 % (ref 0.0–5.0)
HCT: 45.4 % (ref 39.0–52.0)
HEMOGLOBIN: 15.6 g/dL (ref 13.0–17.0)
LYMPHS ABS: 1.5 10*3/uL (ref 0.7–4.0)
LYMPHS PCT: 23.3 % (ref 12.0–46.0)
MCHC: 34.3 g/dL (ref 30.0–36.0)
MCV: 89 fl (ref 78.0–100.0)
Monocytes Absolute: 0.7 10*3/uL (ref 0.1–1.0)
Monocytes Relative: 10.3 % (ref 3.0–12.0)
Neutro Abs: 4.2 10*3/uL (ref 1.4–7.7)
Neutrophils Relative %: 63.5 % (ref 43.0–77.0)
PLATELETS: 141 10*3/uL — AB (ref 150.0–400.0)
RBC: 5.1 Mil/uL (ref 4.22–5.81)
RDW: 13.7 % (ref 11.5–15.5)
WBC: 6.6 10*3/uL (ref 4.0–10.5)

## 2015-04-13 LAB — LIPID PANEL
CHOLESTEROL: 141 mg/dL (ref 0–200)
HDL: 55 mg/dL (ref >39.00)
LDL Cholesterol: 71 mg/dL (ref 0–99)
NONHDL: 86
Total CHOL/HDL Ratio: 3
Triglycerides: 73 mg/dL (ref 0.0–149.0)
VLDL: 14.6 mg/dL (ref 0.0–40.0)

## 2015-04-13 LAB — BASIC METABOLIC PANEL
BUN: 20 mg/dL (ref 6–23)
CO2: 25 meq/L (ref 19–32)
Calcium: 9.2 mg/dL (ref 8.4–10.5)
Chloride: 105 mEq/L (ref 96–112)
Creatinine, Ser: 0.96 mg/dL (ref 0.40–1.50)
GFR: 79.87 mL/min (ref >60.00)
Glucose, Bld: 102 mg/dL — ABNORMAL HIGH (ref 70–99)
Potassium: 4.2 mEq/L (ref 3.5–5.1)
Sodium: 138 mEq/L (ref 135–145)

## 2015-04-13 LAB — HEPATIC FUNCTION PANEL
ALBUMIN: 4.5 g/dL (ref 3.5–5.2)
ALK PHOS: 62 U/L (ref 39–117)
ALT: 24 U/L (ref 0–53)
AST: 23 U/L (ref 0–37)
Bilirubin, Direct: 0.4 mg/dL — ABNORMAL HIGH (ref 0.0–0.3)
Total Bilirubin: 2.4 mg/dL — ABNORMAL HIGH (ref 0.2–1.2)
Total Protein: 7.2 g/dL (ref 6.0–8.3)

## 2015-04-13 LAB — TSH: TSH: 1.18 u[IU]/mL (ref 0.35–4.50)

## 2015-04-13 LAB — PSA: PSA: 1.51 ng/mL (ref 0.10–4.00)

## 2015-04-13 NOTE — Patient Instructions (Signed)
Medication Instructions:  Your physician recommends that you continue on your current medications as directed. Please refer to the Current Medication list given to you today.  Labwork: Lp/bmet/hfp/psa/tsh/cbc  Testing/Procedures: None   Follow-Up: Your physician wants you to follow-up in: 6 months with fasting labs (lp/bmet/hfp)  You will receive a reminder letter in the mail two months in advance. If you don't receive a letter, please call our office to schedule the follow-up appointment.

## 2015-04-13 NOTE — Progress Notes (Signed)
Cardiology Office Note   Date:  04/13/2015   ID:  Thomas Diaz, DOB Apr 06, 1932, MRN [id number]  PCP:  Darlina GuysPOWELL, JERRY, MD  Cardiologist: Thomas Clementhomas Eloise Picone MD  No chief complaint on file.     History of Present Illness: Thomas Diaz is a 81 y.o. male who presents for a six-month follow-up visit. This pleasant 81 year old gentleman is seen for a followup office visit. One year ago he was found to be in new atrial fibrillation. He was continued on his beta blocker and Xarelto was added. He converted spontaneously back into sinus rhythm and had an electrocardiogram which confirmed normal sinus rhythm. He has had no further symptoms of irregular pulse. He had an echocardiogram on 06/19/13 which showed an ejection fraction of 55-60% and showed mild aortic valve stenosis with a peak gradient of 25 and a mean gradient of 15. He has a past history of ischemic heart disease and underwent coronary artery bypass graft surgery in June of 2009. Since then he has done well with no recurrent chest discomfort. He walks daily for exercise.  He had a normal treadmill Myoview stress test in December 2015. He has a history of essential hypertension.  He brought in a list of his blood pressures.  His blood pressure has improved since addition of losartan. He's also had mild hypercholesterolemia and he is on statin therapy.  Resume takes Crestor 5 mg 3 days a week.. He has a history of erectile dysfunction and is on p.r.n. Cialis 20 mg tablet. The patient has a past history of gout. He has had no flareups since starting allopurinol. He has been under a lot of emotional stress because his wife has dementia. He and his wife live out at [facility] [place]. On 11/01/14, the patient had a myocardial perfusion scan which showed no ischemia and normal left ventricular function.   Past Medical History  Diagnosis Date  . Gout   . Heart disease   . Hypertension   . OA (osteoarthritis)   . ED (erectile dysfunction)   .  Hyperlipidemia   . PAC (premature atrial contraction)     Past Surgical History  Procedure Laterality Date  . Coronary artery bypass graft  04/2008  . Cardiovascular stress test  11/03/04    EF 66%  . Colonoscopy  2000  . Tonsillectomy and adenoidectomy       Current Outpatient Prescriptions  Medication Sig Dispense Refill  . aspirin 81 MG tablet Take 81 mg by mouth daily.    . diazepam (VALIUM) 5 MG tablet One tablet daily as needed at bedtime for rest 30 tablet 3  . losartan (COZAAR) 50 MG tablet Take 1 tablet (50 mg total) by mouth daily. 30 tablet 5  . metoprolol (LOPRESSOR) 50 MG tablet Take 50 mg by mouth 3 (three) times daily.    . rosuvastatin (CRESTOR) 5 MG tablet Take 1 tablet (5 mg total) by mouth 4 (four) times a week. FOUR times a week 48 tablet 2  . silodosin (RAPAFLO) 8 MG CAPS capsule Take 8 mg by mouth as needed (difficulty urination). As needed     No current facility-administered medications for this visit.    Allergies:   Halcion; Lipitor; and Lisinopril    Social History:  The patient  reports that he has never smoked. He does not have any smokeless tobacco history on file. He reports that he does not drink alcohol or use illicit drugs.   Family History:  The patient's family history  includes Heart attack in his brother and father.    ROS:  Please see the history of present illness.   Otherwise, review of systems are positive for none.   All other systems are reviewed and negative.    PHYSICAL EXAM: VS:  BP 140/86 mmHg  Pulse 66  Ht  (1.778 m)  Wt 181 lb 12.8 oz (82.464 kg)  BMI 26.09 kg/m2  SpO2 96% , BMI Body mass index is 26.09 kg/(m^2). GEN: Well nourished, well developed, in no acute distress HEENT: normal Neck: no JVD, carotid bruits, or masses Cardiac: RRR; no murmurs, rubs, or gallops,no edema  Respiratory:  clear to auscultation bilaterally, normal work of breathing GI: soft, nontender, nondistended, + BS MS: no deformity or  atrophy Skin: warm and dry, no rash Neuro:  Strength and sensation are intact Psych: euthymic mood, full affect   EKG:  EKG is not ordered today.    Recent Labs: 09/29/2014: ALT 22; BUN 20; Creatinine 1.0; Potassium 4.1; Sodium 142    Lipid Panel    Component Value Date/Time   CHOL 158 09/29/2014 0945   TRIG 82.0 09/29/2014 0945   HDL 50.90 09/29/2014 0945   CHOLHDL 3 09/29/2014 0945   VLDL 16.4 09/29/2014 0945   LDLCALC 91 09/29/2014 0945      Wt Readings from Last 3 Encounters:  04/13/15 181 lb 12.8 oz (82.464 kg)  10/29/14 185 lb (83.915 kg)  10/01/14 183 lb (83.008 kg)      Other studies Reviewed: Additional studies/ records that were reviewed today include: Home blood pressure readings which are satisfactory..    ASSESSMENT AND PLAN:  1. ischemic heart disease status post CABG 2009.  Normal treadmill Myoview December 2015 2. paroxysmal atrial fibrillation. No recurrence 3. essential hypertension 4. Hypercholesterolemia 5. erectile dysfunction 6. History of occasional gout 7. BPH with nocturia  Plan: Lab work today is pending.  Continue current medication.  We may need to increase his Crestor beyond 3 days a week. Recheck in 6 months for office visit lipid panel hepatic function panel and basal metabolic panel.   Current medicines are reviewed at length with the patient today.  The patient does not have concerns regarding medicines.  The following changes have been made:  no change  Labs/ tests ordered today include:   Orders Placed This Encounter  Procedures  . Lipid panel  . Hepatic function panel  . Basic metabolic panel  . TSH  . CBC with Differential/Platelet  . PSA      Signed, Thomas Clement MD 04/13/2015 10:12 AM    [facility] [address] [place], [place], [place]  [address] Phone: [phone]; Fax: [phone]

## 2015-04-19 ENCOUNTER — Telehealth: Payer: Self-pay | Admitting: Cardiology

## 2015-04-19 NOTE — Telephone Encounter (Signed)
New Message  Pt calling in response about lab results being mailed. Can be faxed also: [facility]: [phone]

## 2015-04-19 NOTE — Telephone Encounter (Signed)
Spoke with patient and will fax to facility Called to get fax number, [phone] Fax confirmation received

## 2015-05-19 ENCOUNTER — Other Ambulatory Visit: Payer: Self-pay | Admitting: *Deleted

## 2015-05-19 DIAGNOSIS — F419 Anxiety disorder, unspecified: Secondary | ICD-10-CM

## 2015-05-19 DIAGNOSIS — G4700 Insomnia, unspecified: Secondary | ICD-10-CM

## 2015-05-19 MED ORDER — DIAZEPAM 5 MG PO TABS: ORAL_TABLET | ORAL | Status: DC

## 2015-06-01 ENCOUNTER — Other Ambulatory Visit: Payer: Self-pay | Admitting: *Deleted

## 2015-06-01 DIAGNOSIS — I10 Essential (primary) hypertension: Secondary | ICD-10-CM

## 2015-06-01 MED ORDER — LOSARTAN POTASSIUM 50 MG PO TABS: 50.0000 mg | ORAL_TABLET | Freq: Every day | ORAL | Status: DC

## 2015-06-07 DIAGNOSIS — R17 Unspecified jaundice: Secondary | ICD-10-CM | POA: Insufficient documentation

## 2015-06-30 ENCOUNTER — Other Ambulatory Visit: Payer: Self-pay | Admitting: *Deleted

## 2015-06-30 MED ORDER — ROSUVASTATIN CALCIUM 5 MG PO TABS: 5.0000 mg | ORAL_TABLET | ORAL | Status: DC

## 2015-10-27 ENCOUNTER — Ambulatory Visit: Payer: Medicare Other | Admitting: Cardiology

## 2015-12-16 ENCOUNTER — Encounter: Payer: Self-pay | Admitting: Cardiology

## 2015-12-16 ENCOUNTER — Ambulatory Visit (INDEPENDENT_AMBULATORY_CARE_PROVIDER_SITE_OTHER): Payer: Medicare Other | Admitting: Cardiology

## 2015-12-16 VITALS — BP 140/80 | HR 55 | Ht 70.0 in | Wt 187.8 lb

## 2015-12-16 DIAGNOSIS — I10 Essential (primary) hypertension: Secondary | ICD-10-CM

## 2015-12-16 DIAGNOSIS — Z951 Presence of aortocoronary bypass graft: Secondary | ICD-10-CM | POA: Diagnosis not present

## 2015-12-16 DIAGNOSIS — I480 Paroxysmal atrial fibrillation: Secondary | ICD-10-CM

## 2015-12-16 HISTORY — DX: Gilbert syndrome: E80.4

## 2015-12-16 NOTE — Progress Notes (Signed)
Cardiology Office Note   Date:  12/16/2015   ID:  RAVEN HARMES, DOB 1932-08-01, MRN [id number]  PCP:  [name], MD  Cardiologist: Cassell Clement MD  Chief Complaint  Patient presents with  . scheduled follow up    Atrial Fib. Denies chest pain, shortness of breath, le edema, or claudication      History of Present Illness: CONARD ALVIRA is a 82 y.o. male who presents for a six-month follow-up visit  This pleasant 82 year old gentleman is seen for a followup office visit. 2 years ago he was found to be in new atrial fibrillation. He was continued on his beta blocker and Xarelto was added. He converted spontaneously back into sinus rhythm and had an electrocardiogram which confirmed normal sinus rhythm. He has had no further symptoms of irregular pulse. He had an echocardiogram on 06/19/13 which showed an ejection fraction of 55-60% and showed mild aortic valve stenosis with a peak gradient of 25 and a mean gradient of 15. He has a past history of ischemic heart disease and underwent coronary artery bypass graft surgery in June of 2009. Since then he has done well with no recurrent chest discomfort. He walks daily for exercise. He had a normal treadmill Myoview stress test in December 2015. He has a history of essential hypertension. He brought in a list of his blood pressures. His blood pressure has improved since addition of losartan. He's also had mild hypercholesterolemia and he is on statin therapy. Resume takes Crestor 5 mg 3 days a week.. He has a history of erectile dysfunction and is on p.r.n. Cialis 20 mg tablet. The patient has a past history of gout. He has had no flareups since starting allopurinol. He has been under a lot of emotional stress because his wife has dementia. He and his wife live out at [employer]. On 11/01/14, the patient had a myocardial perfusion scan which showed no ischemia and normal left ventricular function. Since last visit the patient  has been more of a caregiver for his wife who has progressive dementia.  He has not been able to exercise is widely but he does give a 2 exercise classes at [facility] several times a week.  Past Medical History  Diagnosis Date  . Gout   . Heart disease   . Hypertension   . OA (osteoarthritis)   . ED (erectile dysfunction)   . Hyperlipidemia   . PAC (premature atrial contraction)     Past Surgical History  Procedure Laterality Date  . Coronary artery bypass graft  04/2008  . Cardiovascular stress test  11/03/04    EF 66%  . Colonoscopy  2000  . Tonsillectomy and adenoidectomy       Current Outpatient Prescriptions  Medication Sig Dispense Refill  . aspirin 81 MG tablet Take 81 mg by mouth daily.    . diazepam (VALIUM) 5 MG tablet Take 5 mg by mouth at bedtime as needed (sleep).    . losartan (COZAAR) 50 MG tablet Take 1 tablet (50 mg total) by mouth daily. 90 tablet 1  . metoprolol (LOPRESSOR) 50 MG tablet Take 50 mg by mouth 3 (three) times daily.    . rosuvastatin (CRESTOR) 5 MG tablet Take 1 tablet (5 mg total) by mouth 4 (four) times a week. FOUR times a week 48 tablet 0  . silodosin (RAPAFLO) 8 MG CAPS capsule Take 8 mg by mouth as needed (difficulty urination). As needed    . tamsulosin (FLOMAX)  0.4 MG CAPS capsule Take 0.4 mg by mouth daily as needed. urination     No current facility-administered medications for this visit.    Allergies:   Halcion; Lipitor; and Lisinopril    Social History:  The patient  reports that he has never smoked. He does not have any smokeless tobacco history on file. He reports that he does not drink alcohol or use illicit drugs.   Family History:  The patient's family history includes Heart attack in his brother and father.    ROS:  Please see the history of present illness.   Otherwise, review of systems are positive for none.   All other systems are reviewed and negative.    PHYSICAL EXAM: VS:  BP 140/80 mmHg  Pulse 55  Ht 5'  10" (1.778 m)  Wt 187 lb 12.8 oz (85.186 kg)  BMI 26.95 kg/m2 , BMI Body mass index is 26.95 kg/(m^2). GEN: Well nourished, well developed, in no acute distress HEENT: normal Neck: no JVD, carotid bruits, or masses Cardiac: RRR; no murmurs, rubs, or gallops,no edema  Respiratory:  clear to auscultation bilaterally, normal work of breathing GI: soft, nontender, nondistended, + BS MS: no deformity or atrophy Skin: warm and dry, no rash Neuro:  Strength and sensation are intact Psych: euthymic mood, full affect   EKG:  EKG is ordered today. The ekg ordered today demonstrates sinus bradycardia at 55 bpm.  Nonspecific ST-T wave abnormalities improved since 10/01/14   Recent Labs: 04/13/2015: ALT 24; BUN 20; Creatinine, Ser 0.96; Hemoglobin 15.6; Platelets 141.0*; Potassium 4.2; Sodium 138; TSH 1.18    Lipid Panel    Component Value Date/Time   CHOL 141 04/13/2015 0947   TRIG 73.0 04/13/2015 0947   HDL 55.00 04/13/2015 0947   CHOLHDL 3 04/13/2015 0947   VLDL 14.6 04/13/2015 0947   LDLCALC 71 04/13/2015 0947      Wt Readings from Last 3 Encounters:  12/16/15 187 lb 12.8 oz (85.186 kg)  04/13/15 181 lb 12.8 oz (82.464 kg)  10/29/14 185 lb (83.915 kg)         ASSESSMENT AND PLAN:  1. ischemic heart disease status post CABG 2009. Normal treadmill Myoview December 2015 2. paroxysmal atrial fibrillation. No recurrence 3. essential hypertension 4. Hypercholesterolemia 5. erectile dysfunction 6. History of occasional gout 7. BPH with nocturia 8.  Gilbert's syndrome   Plan: We reviewed his blood tests dated 12/07/15 obtained at [facility].  His cholesterol is 124 total.  LDL is 54.. Continue current medication. We did not give him a return appointment today.  He will follow-up with Dr. Donnie Aho for cardiology.   Current medicines are reviewed at length with the patient today.  The patient does not have concerns regarding medicines.  The following changes have been  made:  no change  Labs/ tests ordered today include:   Orders Placed This Encounter  Procedures  . EKG 12-Lead       SignedCassell Clement MD 12/16/2015 9:48 AM    [facility] [address] [place], [place], [place]  [address] Phone: [phone]; Fax: [phone]

## 2015-12-16 NOTE — Patient Instructions (Signed)
Medication Instructions:  Your physician recommends that you continue on your current medications as directed. Please refer to the Current Medication list given to you today.   Labwork: none  Testing/Procedures: none  Follow-Up: Follow up with [facility] as needed.   Any Other Special Instructions Will Be Listed Below (If Applicable).     If you need a refill on your cardiac medications before your next appointment, please call your pharmacy.

## 2016-09-15 ENCOUNTER — Emergency Department (HOSPITAL_BASED_OUTPATIENT_CLINIC_OR_DEPARTMENT_OTHER)
Admission: EM | Admit: 2016-09-15 | Discharge: 2016-09-15 | Disposition: A | Discharge status: Home / Self Care | Payer: Medicare Other | Attending: Emergency Medicine | Admitting: Emergency Medicine

## 2016-09-15 ENCOUNTER — Encounter (HOSPITAL_BASED_OUTPATIENT_CLINIC_OR_DEPARTMENT_OTHER): Payer: Self-pay | Admitting: Emergency Medicine

## 2016-09-15 DIAGNOSIS — I4891 Unspecified atrial fibrillation: Secondary | ICD-10-CM | POA: Diagnosis not present

## 2016-09-15 DIAGNOSIS — Z7982 Long term (current) use of aspirin: Secondary | ICD-10-CM | POA: Insufficient documentation

## 2016-09-15 DIAGNOSIS — Z79899 Other long term (current) drug therapy: Secondary | ICD-10-CM | POA: Diagnosis not present

## 2016-09-15 DIAGNOSIS — I10 Essential (primary) hypertension: Secondary | ICD-10-CM | POA: Insufficient documentation

## 2016-09-15 DIAGNOSIS — R000 Tachycardia, unspecified: Secondary | ICD-10-CM | POA: Diagnosis present

## 2016-09-15 MED ORDER — RIVAROXABAN 20 MG PO TABS: 20.0000 mg | ORAL_TABLET | Freq: Every day | ORAL | 0 refills | Status: DC

## 2016-09-15 NOTE — Discharge Instructions (Signed)
Call your cardiologist on Monday.

## 2016-09-15 NOTE — ED Triage Notes (Signed)
Pt h/o non-symptomatic Afib with HR 80-100, that affected his BP, but that the Afib spontaneously resolved 4 years ago. States his HR has been for past 4 years approx 50-60 bpm and for past few days his HR has been 80-100 again.  Pt checked his HR on Thurs and this morning also while checking his BP after a walk.  Pt states he checks his pulse often and noticed his pulse seemed a bit weaker.

## 2016-09-15 NOTE — ED Provider Notes (Signed)
[facility] Provider Note   CSN: [id number] Arrival date & time: 09/15/16  1128     History   Chief Complaint Chief Complaint  Patient presents with  . Irregular Heart Beat    HPI Thomas Diaz is a 81 y.o. male.  82 yo M with a chief complaint of tachycardia. They're actually asymptomatic the patient relies them when he checked his blood pressure after exercising about 3 days ago. That his heart rate was elevated. Has been personally elevated sense. He talked to his old cardiologist who suggested that he come to the emergency department for anticoagulation. He denies chest pain shortness of breath or palpitations.   The history is provided by the patient.  Palpitations   This is a new problem. The current episode started more than 2 days ago. The problem occurs constantly. The problem has not changed since onset.The problem is associated with exercise. On average, each episode lasts 3 days. Pertinent negatives include no fever, no chest pain, no abdominal pain, no vomiting, no headaches and no shortness of breath. He has tried nothing for the symptoms. The treatment provided no relief. There are no known risk factors.    Past Medical History:  Diagnosis Date  . A-fib (HCC)   . ED (erectile dysfunction)   . Gout   . Heart disease   . Hyperlipidemia   . Hypertension   . OA (osteoarthritis)   . PAC (premature atrial contraction)     Patient Active Problem List   Diagnosis Date Noted  . Gilbert syndrome 12/16/2015  . Mild aortic stenosis 07/06/2013  . Atrial fibrillation (HCC) 06/16/2013  . Hx of CABG 05/30/2011  . Gout 05/30/2011  . BPH (benign prostatic hyperplasia) 05/30/2011  . Erectile dysfunction 05/30/2011  . Benign hypertensive heart disease without heart failure 05/30/2011    Past Surgical History:  Procedure Laterality Date  . CARDIOVASCULAR STRESS TEST  11/03/04   EF 66%  . COLONOSCOPY  2000  . CORONARY ARTERY BYPASS GRAFT  04/2008  .  TONSILLECTOMY AND ADENOIDECTOMY         Home Medications    Prior to Admission medications   Medication Sig Start Date End Date Taking? Authorizing Provider  aspirin 81 MG tablet Take 81 mg by mouth daily.   Yes Historical Provider, MD  losartan (COZAAR) 50 MG tablet Take 1 tablet (50 mg total) by mouth daily. 06/01/15  Yes Cassell Clement, MD  metoprolol (LOPRESSOR) 50 MG tablet Take 50 mg by mouth 3 (three) times daily. 09/08/13  Yes Cassell Clement, MD  rosuvastatin (CRESTOR) 5 MG tablet Take 1 tablet (5 mg total) by mouth 4 (four) times a week. FOUR times a week 06/30/15  Yes Cassell Clement, MD  diazepam (VALIUM) 5 MG tablet Take 5 mg by mouth at bedtime as needed (sleep).    Historical Provider, MD  Rivaroxaban (XARELTO) 20 MG TABS tablet Take 1 tablet (20 mg total) by mouth daily with supper. 09/15/16   Melene Plan, DO  silodosin (RAPAFLO) 8 MG CAPS capsule Take 8 mg by mouth as needed (difficulty urination). As needed 05/30/11   Cassell Clement, MD  tamsulosin (FLOMAX) 0.4 MG CAPS capsule Take 0.4 mg by mouth daily as needed. urination 09/30/15 09/29/16  Historical Provider, MD    Family History Family History  Problem Relation Age of Onset  . Heart attack Father   . Heart attack Brother     Social History Social History  Substance Use Topics  . Smoking status:  Never Smoker  . Smokeless tobacco: Never Used  . Alcohol use No     Allergies   Lisinopril   Review of Systems Review of Systems  Constitutional: Negative for chills and fever.  HENT: Negative for congestion and facial swelling.   Eyes: Negative for discharge and visual disturbance.  Respiratory: Negative for shortness of breath.   Cardiovascular: Positive for palpitations. Negative for chest pain.  Gastrointestinal: Negative for abdominal pain, diarrhea and vomiting.  Musculoskeletal: Negative for arthralgias and myalgias.  Skin: Negative for color change and rash.  Neurological: Negative for tremors,  syncope and headaches.  Psychiatric/Behavioral: Negative for confusion and dysphoric mood.     Physical Exam Updated Vital Signs BP 138/90 (BP Location: Left Arm)   Pulse 93   Temp 98.1 F (36.7 C) (Oral)   Resp 20   Ht 5\' 10"  (1.778 m)   Wt 178 lb (80.7 kg)   SpO2 95%   BMI 25.54 kg/m   Physical Exam  Constitutional: He is oriented to person, place, and time. He appears well-developed and well-nourished.  HENT:  Head: Normocephalic and atraumatic.  Eyes: EOM are normal. Pupils are equal, round, and reactive to light.  Neck: Normal range of motion. Neck supple. No JVD present.  Cardiovascular: Normal rate.  An irregularly irregular rhythm present. Exam reveals no gallop and no friction rub.   No murmur heard. Pulmonary/Chest: No respiratory distress. He has no wheezes.  Abdominal: He exhibits no distension and no mass. There is no tenderness. There is no rebound and no guarding.  Musculoskeletal: Normal range of motion.  Neurological: He is alert and oriented to person, place, and time.  Skin: No rash noted. No pallor.  Psychiatric: He has a normal mood and affect. His behavior is normal.  Nursing note and vitals reviewed.    ED Treatments / Results  Labs (all labs ordered are listed, but only abnormal results are displayed) Labs Reviewed - No data to display  EKG  EKG Interpretation  Date/Time:  Saturday September 15 2016 11:54:59 EDT Ventricular Rate:  95 PR Interval:    QRS Duration: 113 QT Interval:  373 QTC Calculation: 469 R Axis:   78 Text Interpretation:  Atrial fibrillation Borderline intraventricular conduction delay RSR' in V1 or V2, right VCD or RVH Nonspecific T abnormalities, lateral leads new st changes inferiorly compared to prior Otherwise no significant change Confirmed by Fay Bagg MD, Reuel BoomANIEL ([id number]) on 09/15/2016 12:11:19 PM       Radiology No results found.  Procedures Procedures (including critical care time)  Medications Ordered in  ED Medications - No data to display   Initial Impression / Assessment and Plan / ED Course  I have reviewed the triage vital signs and the nursing notes.  Pertinent labs & imaging results that were available during my care of the patient were reviewed by me and considered in my medical decision making (see chart for details).  Clinical Course     chadsvasc of a 743.  82 yo M With a chief complaint of atrial fibrillation. Patient has had this off and on in the past. He converted spontaneously about 5 years ago. He has had no issues since then. Recently noticed that he was tachycardic and irregular. He has been exercising twice a week 45 minutes at a time without chest pain shortness of breath palpitations. He has an EKG that is different from his EKG from about 5 years ago. Discussed this with the patient and with no exertional symptoms or  chest pain or shortness of breath and did not feel that this needs to be worked up further at this time. I discussed with the patient about increasing his metoprolol. Recommended that he follow-up with his cardiologist.  Will start him on anticoagulation.  1:55 PM:  I have discussed the diagnosis/risks/treatment options with the patient and believe the pt to be eligible for discharge home to follow-up with Cards. We also discussed returning to the ED immediately if new or worsening sx occur. We discussed the sx which are most concerning (e.g., chest pain, sob) that necessitate immediate return. Medications administered to the patient during their visit and any new prescriptions provided to the patient are listed below.  Medications given during this visit Medications - No data to display   The patient appears reasonably screen and/or stabilized for discharge and I doubt any other medical condition or other [facility] requiring further screening, evaluation, or treatment in the ED at this time prior to discharge.     Final Clinical Impressions(s) / ED Diagnoses    Final diagnoses:  Atrial fibrillation, unspecified type [facility](HCC)    New Prescriptions Discharge Medication List as of 09/15/2016 12:16 PM    START taking these medications   Details  Rivaroxaban (XARELTO) 20 MG TABS tablet Take 1 tablet (20 mg total) by mouth daily with supper., Starting Sat 09/15/2016, Print         Melene Planan Micael Barb, DO 09/15/16 1355

## 2017-03-04 NOTE — H&P (Signed)
Thomas Diaz, Thomas Diaz    Date of visit:  03/04/2017 DOB:  [birth year]    Age:  83 yrs. Medical record number:  [id number]     Account number:  [id number] Primary Care Provider: TURBYFILL,HOLLY ____________________________ CURRENT DIAGNOSES  1. Aortic valve stenosis  2. Persistent atrial fibrillation  3. Dyspnea  4. CAD Native without angina  5. Long term (current) use of anticoagulants  6. Presence of aortocoronary bypass graft ____________________________ ALLERGIES  No Known Allergies ____________________________ MEDICATIONS  1. losartan 50 mg tablet, 1 p.o. daily  2. metoprolol tartrate 50 mg tablet, TID  3. rosuvastatin 5 mg tablet, three times per week  4. Xarelto 20 mg tablet, 1 tablet PO QHS ____________________________ HISTORY OF PRESENT ILLNESS Patient returns for cardiac followup. Since he was here he is noted progressive fatigue as well as exertional dyspnea. He remains in atrial fibrillation and has not been able to walk long distances or go up stairs without being short of breath. He previously was quite vigorous. He is not currently having any angina. He is able to go to his activities where he does weight training without difficulty but finds that aerobic exercise is quite an issue for him. He has not had any bleeding complications from anticoagulation. He has previous bypass grafting as well as aortic stenosis. He does not that his blood pressures running a little bit low today. His atrial fibrillation has been persistent since June of this year. Echocardiogram done in January showed moderate concentric LVH and moderate aortic stenosis and regurgitation. ____________________________ PAST HISTORY  Past Medical Illnesses:  hypertension, hyperlipidemia, gout;  Cardiovascular Illnesses:  CAD, atrial fibrillation, aortic stenosis;  Infectious Diseases:  no previous history of significant infectious diseases;  Surgical Procedures:  CABG;  Trauma History:  no previous history of significant trauma;   NYHA Classification:  I;  Canadian Angina Classification:  Class 0: Asymptomatic;  Cardiology Procedures-Invasive:  CABG w LIMA to LAD, SVG to RCA, SVG to int-OM and SVG to dx 05/07/08 Dr. Servando Snare, cardiac cath (left) 2009;  Cardiology Procedures-Noninvasive:  echocardiogram 2014, treadmill Myoview December 2015, echocardiogram January 2018;  Cardiac Cath Results:  60% left main, 95% RCA;  Peripheral Vascular Procedures:  carotid doppler 2009;  LVEF not documented,   ____________________________ CARDIO-PULMONARY TEST DATES EKG Date:  11/05/2016;  CABG: 05/07/2008;  Nuclear Study Date:  11/02/2014;  Echocardiography Date: 11/26/2016;   ____________________________ FAMILY HISTORY Brother -- Death due to natural cause, Brother dead Brother -- Coronary artery bypass grafting, Heart disease, Brother dead Father -- Father dead, Sudden death Adult Mother -- Mother dead, Hypertension Sister -- Sister alive and well Sister -- Sister alive with problem, Dementia/Alzheimers ____________________________ SOCIAL HISTORY Alcohol Use:  does not use alcohol;  Smoking:  never smoked;  Diet:  regular diet;  Lifestyle:  married;  Exercise:  some exercise;  Occupation:  retired;  Residence:  wife has dementia;  Job Description:  Social research officer, government;   ____________________________ REVIEW OF SYSTEMS General:  malaise and fatigue Eyes: cataracts Respiratory: denies dyspnea, cough, wheezing or hemoptysis. Cardiovascular:  please review HPI Abdominal: denies dyspepsia, GI bleeding, constipation, or diarrhea Genitourinary-Male: nocturia  Musculoskeletal:  denies arthritis, venous insufficiency, or muscle weakness Neurological:  denies headaches, stroke, or TIA Psychiatric:  denies depression or anxiety  ____________________________ PHYSICAL EXAMINATION VITAL SIGNS  Blood Pressure:  140/85 Sitting, Left arm, regular cuff   Pulse:  80/min. Weight:  189.00 lbs. Height:  70"BMI: 27  Constitutional:  pleasant white  male in no acute distress Skin:  warm  and dry to touch, no apparent skin lesions, or masses noted. Head:  normocephalic, normal hair pattern, no masses or tenderness Eyes:  EOMS Intact, PERRLA, C and S clear, Funduscopic exam not done. Neck:  supple, without massess. No JVD, thyromegaly or carotid bruits. Carotid upstroke normal. Chest:  normal symmetry, clear to auscultation., healed median sternotomy scar Cardiac:  irregularly irregular rhythm, normal S1 and S2, no S3 or S4, grade 2/6 systolic murmur at aortic area radiating to neck Abdomen:  abdomen soft,non-tender, no masses, no hepatospenomegaly, or aneurysm noted Peripheral Pulses:  the femoral,dorsalis pedis, and posterior tibial pulses are full and equal bilaterally with no bruits auscultated. Extremities & Back:  no deformities, clubbing, cyanosis, erythema or edema observed. Normal muscle strength and tone. Neurological:  no gross motor or sensory deficits noted, affect appropriate, oriented x3. ____________________________ MOST RECENT LIPID PANEL 12/07/15  CHOL TOTL 124 mg/dl, LDL 54 NM, HDL 57 mg/dl, TRIGLYCER 63 mg/dl, ALT 17 u/l, ALK PHOS 48 u/l, CHOL/HDL 2.2 (Calc), AST 19 u/l and VLDL 13 ____________________________ IMPRESSIONS/PLAN  1. Persistent atrial fibrillation that is symptomatic 2. Hypertensive heart disease 3. Mild-to-moderate aortic stenosis 4. Long-term use of anticoagulation  Recommendations:  Long discussion about atrial fibrillation and its management. He is currently symptomatic and he has been anticoagulated. We discussed efforts to put him back in sinus rhythm and discussed specifically cardioversion as well as the use of antiarrhythmic drugs. His rhythm strip shows him to be in atrial fibrillation currently. I discussed the pros and cons of maintenance of sinus rhythm with him but since she really is limited in what he can do off on efforts to try to get him back in sinus rhythm might be the best thing to  do. We discussed the possibility of reversion to atrial fibrillation following cardioversion but felt initially we would try cardioversion and see if he would hold. If he fails to hold sinus rhythm we could consider antiarrhythmics thatlikely would either need to be amiodarone or Tikosyn. Cardioversion discussed with the patient including risks of stroke, arrhythmia, death, or anesthesia risks. The patient understands and is willing to proceed.  ____________________________ TODAYS ORDERS  1. Comprehensive Metabolic Panel: Today  2. Complete Blood Count: Today                       ____________________________ Cardiology Physician:  Kerry Hough MD [facility]

## 2017-03-07 ENCOUNTER — Encounter (HOSPITAL_COMMUNITY)
Admission: RE | Disposition: A | Discharge status: Home / Self Care | Payer: Self-pay | Source: Ambulatory Visit | Attending: Cardiology

## 2017-03-07 ENCOUNTER — Encounter (HOSPITAL_COMMUNITY): Payer: Self-pay | Admitting: Anesthesiology

## 2017-03-07 ENCOUNTER — Ambulatory Visit (HOSPITAL_COMMUNITY)
Admission: RE | Admit: 2017-03-07 | Discharge: 2017-03-07 | Disposition: A | Discharge status: Home / Self Care | Payer: Medicare Other | Source: Ambulatory Visit | Attending: Cardiology | Admitting: Cardiology

## 2017-03-07 ENCOUNTER — Ambulatory Visit (HOSPITAL_COMMUNITY): Payer: Medicare Other | Admitting: Anesthesiology

## 2017-03-07 DIAGNOSIS — I350 Nonrheumatic aortic (valve) stenosis: Secondary | ICD-10-CM | POA: Diagnosis not present

## 2017-03-07 DIAGNOSIS — Z7901 Long term (current) use of anticoagulants: Secondary | ICD-10-CM | POA: Diagnosis not present

## 2017-03-07 DIAGNOSIS — I481 Persistent atrial fibrillation: Secondary | ICD-10-CM | POA: Insufficient documentation

## 2017-03-07 DIAGNOSIS — Z951 Presence of aortocoronary bypass graft: Secondary | ICD-10-CM | POA: Diagnosis not present

## 2017-03-07 DIAGNOSIS — I2510 Atherosclerotic heart disease of native coronary artery without angina pectoris: Secondary | ICD-10-CM

## 2017-03-07 DIAGNOSIS — I119 Hypertensive heart disease without heart failure: Secondary | ICD-10-CM | POA: Insufficient documentation

## 2017-03-07 DIAGNOSIS — Z8249 Family history of ischemic heart disease and other diseases of the circulatory system: Secondary | ICD-10-CM | POA: Insufficient documentation

## 2017-03-07 DIAGNOSIS — E785 Hyperlipidemia, unspecified: Secondary | ICD-10-CM | POA: Insufficient documentation

## 2017-03-07 DIAGNOSIS — M109 Gout, unspecified: Secondary | ICD-10-CM | POA: Diagnosis not present

## 2017-03-07 DIAGNOSIS — M1990 Unspecified osteoarthritis, unspecified site: Secondary | ICD-10-CM | POA: Insufficient documentation

## 2017-03-07 DIAGNOSIS — I480 Paroxysmal atrial fibrillation: Secondary | ICD-10-CM | POA: Diagnosis present

## 2017-03-07 HISTORY — DX: Atherosclerotic heart disease of native coronary artery without angina pectoris: I25.10

## 2017-03-07 HISTORY — PX: CARDIOVERSION: SHX1299

## 2017-03-07 LAB — POCT I-STAT 4, (NA,K, GLUC, HGB,HCT)
Glucose, Bld: 107 mg/dL — ABNORMAL HIGH (ref 65–99)
HCT: 45 % (ref 39.0–52.0)
HEMOGLOBIN: 15.3 g/dL (ref 13.0–17.0)
POTASSIUM: 3.8 mmol/L (ref 3.5–5.1)
SODIUM: 144 mmol/L (ref 135–145)

## 2017-03-07 SURGERY — CARDIOVERSION
Anesthesia: General

## 2017-03-07 MED ORDER — LIDOCAINE HCL (CARDIAC) 20 MG/ML IV SOLN
INTRAVENOUS | Status: DC | PRN
  Administered 2017-03-07: 60 mg via INTRAVENOUS

## 2017-03-07 MED ORDER — HYDROCORTISONE 1 % EX CREA
1.0000 | TOPICAL_CREAM | Freq: Three times a day (TID) | CUTANEOUS | Status: DC | PRN
Start: 2017-03-07 — End: 2017-03-07

## 2017-03-07 MED ORDER — PROPOFOL 500 MG/50ML IV EMUL
INTRAVENOUS | Status: DC | PRN
  Administered 2017-03-07: 50 ug/kg/min via INTRAVENOUS

## 2017-03-07 MED ORDER — SODIUM CHLORIDE 0.9 % IV SOLN
INTRAVENOUS | Status: DC
  Administered 2017-03-07: 12:00:00 via INTRAVENOUS

## 2017-03-07 NOTE — Transfer of Care (Signed)
Immediate Anesthesia Transfer of Care Note  Patient: Thomas Diaz  Procedure(s) Performed: Procedure(s): CARDIOVERSION (N/A)  Patient Location: Endoscopy Unit  Anesthesia Type:General  Level of Consciousness: awake, oriented and patient cooperative  Airway & Oxygen Therapy: Patient Spontanous Breathing and Patient connected to nasal cannula oxygen  Post-op Assessment: Report given to RN and Post -op Vital signs reviewed and stable  Post vital signs: Reviewed  Last Vitals:  Vitals:   03/07/17 1120 03/07/17 1325  BP: (!) 159/96 117/71  Pulse: 85 63  Resp: 20 (!) 29  Temp: 36.7 C 36.6 C    Last Pain:  Vitals:   03/07/17 1325  TempSrc: Oral         Complications: No apparent anesthesia complications

## 2017-03-07 NOTE — Anesthesia Preprocedure Evaluation (Addendum)
Anesthesia Evaluation  Patient identified by MRN, date of birth, ID band Patient awake    Reviewed: Allergy & Precautions, NPO status , Patient's Chart, lab work & pertinent test results  Airway Mallampati: II  TM Distance: >3 FB Neck ROM: Full    Dental no notable dental hx. (+) Teeth Intact, Caps, Dental Advisory Given   Pulmonary neg pulmonary ROS,    Pulmonary exam normal breath sounds clear to auscultation       Cardiovascular hypertension, Pt. on home beta blockers and Pt. on medications + CAD and + CABG  Normal cardiovascular exam+ dysrhythmias Atrial Fibrillation  Rhythm:Regular Rate:Normal     Neuro/Psych negative neurological ROS  negative psych ROS   GI/Hepatic negative GI ROS, Neg liver ROS,   Endo/Other  Hyperlipidemia  Renal/GU negative Renal ROS  negative genitourinary   Musculoskeletal  (+) Arthritis , Osteoarthritis,  Gout   Abdominal   Peds  Hematology On xareleto- last dose last PM   Anesthesia Other Findings   Reproductive/Obstetrics                            Anesthesia Physical Anesthesia Plan  ASA: III  Anesthesia Plan: General   Post-op Pain Management:    Induction: Intravenous  Airway Management Planned: Natural Airway and Mask  Additional Equipment:   Intra-op Plan:   Post-operative Plan:   Informed Consent: I have reviewed the patients History and Physical, chart, labs and discussed the procedure including the risks, benefits and alternatives for the proposed anesthesia with the patient or authorized representative who has indicated his/her understanding and acceptance.   Dental advisory given  Plan Discussed with: Anesthesiologist, CRNA and Surgeon  Anesthesia Plan Comments:         Anesthesia Quick Evaluation

## 2017-03-07 NOTE — Discharge Instructions (Signed)
Electrical Cardioversion, Care After °This sheet gives you information about how to care for yourself after your procedure. Your health care provider may also give you more specific instructions. If you have problems or questions, contact your health care provider. °What can I expect after the procedure? °After the procedure, it is common to have: °· Some redness on the skin where the shocks were given. °Follow these instructions at home: °· Do not drive for 24 hours if you were given a medicine to help you relax (sedative). °· Take over-the-counter and prescription medicines only as told by your health care provider. °· Ask your health care provider how to check your pulse. Check it often. °· Rest for 48 hours after the procedure or as told by your health care provider. °· Avoid or limit your caffeine use as told by your health care provider. °Contact a health care provider if: °· You feel like your heart is beating too quickly or your pulse is not regular. °· You have a serious muscle cramp that does not go away. °Get help right away if: °· You have discomfort in your chest. °· You are dizzy or you feel faint. °· You have trouble breathing or you are short of breath. °· Your speech is slurred. °· You have trouble moving an arm or leg on one side of your body. °· Your fingers or toes turn cold or blue. °This information is not intended to replace advice given to you by your health care provider. Make sure you discuss any questions you have with your health care provider. °Document Released: 08/26/2013 Document Revised: 06/08/2016 Document Reviewed: 05/11/2016 °Elsevier Interactive Patient Education © 2017 [employer] ° °

## 2017-03-07 NOTE — Anesthesia Postprocedure Evaluation (Signed)
Anesthesia Post Note  Patient: Thomas Diaz  Procedure(s) Performed: Procedure(s) (LRB): CARDIOVERSION (N/A)  Patient location during evaluation: PACU Anesthesia Type: General Level of consciousness: awake and alert and oriented Pain management: pain level controlled Vital Signs Assessment: post-procedure vital signs reviewed and stable Respiratory status: spontaneous breathing, nonlabored ventilation and respiratory function stable Cardiovascular status: blood pressure returned to baseline and stable Postop Assessment: no signs of nausea or vomiting Anesthetic complications: no       Last Vitals:  Vitals:   03/07/17 1350 03/07/17 1400  BP: 127/75 (!) 150/83  Pulse: 62 65  Resp: 15 20  Temp:      Last Pain:  Vitals:   03/07/17 1325  TempSrc: Oral                 Girtrude Enslin A.

## 2017-03-07 NOTE — Interval H&P Note (Signed)
History and Physical Interval Note:  03/07/2017 12:37 PM  Thomas Diaz  has presented today for surgery, with the diagnosis of AFIB  The various methods of treatment have been discussed with the patient and family. After consideration of risks, benefits and other options for treatment, the patient has consented to  Procedure(s): CARDIOVERSION (N/A) as a surgical intervention .  The patient's history has been reviewed, patient examined, no change in status, stable for surgery.  I have reviewed the patient's chart and labs.  Questions were answered to the patient's satisfaction.     Georga Hacking

## 2017-03-07 NOTE — CV Procedure (Signed)
Electrical Cardioversion Procedure Note  Thomas Diaz   83 y.o. male MRN: [id number] DOB: 12/28/1931  Today's date: 03/07/2017  Procedure: Electrical Cardioversion  Indications:  Atrial Fibrillation  Time Out: Verified patient identification, verified procedure,medications/allergies/relevent history reviewed, required imaging and test results available.  Performed  Procedure Details  The patient was NPO after midnight. Anesthesia was administered at the beside  by Dr.Foster with 60 mg of lidocaine and 50 mg of propofol.  Cardioversion was done with synchronized biphasic defibrillation with AP pads with 120 watts.  The patient converted to normal sinus rhythm. The patient tolerated the procedure well   IMPRESSION:  Successful cardioversion of atrial fibrillation    Thomas Diaz, Thomas Diaz. MD [facility]   03/07/2017, 1:22 PM

## 2017-04-09 NOTE — H&P (Signed)
Thomas Diaz, Thomas Diaz    Date of visit:  04/09/2017 DOB:  Apr 11, 1932    Age:  83 yrs. Medical record number:  [id number]     Account number:  [id number] Primary Care Provider: TURBYFILL,HOLLY ____________________________ CURRENT DIAGNOSES  1. Aortic valve stenosis  2. Persistent atrial fibrillation  3. Dyspnea  4. CAD Native without angina  5. Long term (current) use of anticoagulants  6. Presence of aortocoronary bypass graft ____________________________ ALLERGIES  No Known Allergies ____________________________ MEDICATIONS  1. amiodarone 200 mg tablet, once daily  2. losartan 50 mg tablet, 1 p.o. daily  3. metoprolol tartrate 50 mg tablet, BID  4. rosuvastatin 5 mg tablet, three times per week  5. Xarelto 20 mg tablet, 1 tablet PO QHS ____________________________ HISTORY OF PRESENT ILLNESS Patient seen for evaluation prior to cardioversion. He was diagnosed with persistent atrial fibrillation earlier this year and because of persistent fatigue and shortness of breath underwent cardioversion in April. He was unable to maintain sinus rhythm and after a long discussion of alternatives we put him on amiodarone. He took amiodarone twice daily for 2 weeks and then last week we cut him down to one tablet a day. He had an episode where he became weak and stumbled on the grass last week and I saw him in the office and reduce his metoprolol. The time he was still in atrial fibrillation. He still complains of dyspnea as well as weakness particularly when going down stairs. We talked about another cardioversion after he was on a medicine to maintain sinus rhythm and he is willing to undergo cardioversion again. Has a history of mild-to-moderate aortic stenosis. He also has coronary artery disease with previous bypass grafting in 2009 and has no angina. He did not have much angina prior to his coronary bypass grafting. He denies PND, orthopnea or edema. Previous echo showed preserved LV function and moderate atrial  enlargement. He has been taking his anticoagulation without interruption. ____________________________ PAST HISTORY  Past Medical Illnesses:  hypertension, hyperlipidemia, gout;  Cardiovascular Illnesses:  CAD, atrial fibrillation, aortic stenosis;  Infectious Diseases:  no previous history of significant infectious diseases;  Surgical Procedures:  CABG;  Trauma History:  no previous history of significant trauma;  NYHA Classification:  I;  Canadian Angina Classification:  Class 0: Asymptomatic;  Cardiology Procedures-Invasive:  CABG w LIMA to LAD, SVG to RCA, SVG to int-OM and SVG to dx 05/07/08 Dr. Servando Snare, cardiac cath (left) 2009, cardioversion April 2018;  Cardiology Procedures-Noninvasive:  echocardiogram 2014, treadmill Myoview December 2015, echocardiogram January 2018;  Cardiac Cath Results:  60% left main, 95% RCA;  Peripheral Vascular Procedures:  carotid doppler 2009;  LVEF of 55% documented via echocardiogram on 11/26/2016,   ____________________________ CARDIO-PULMONARY TEST DATES EKG Date:  04/09/2017;  CABG: 05/07/2008;  Nuclear Study Date:  11/02/2014;  Echocardiography Date: 11/26/2016;   ____________________________ FAMILY HISTORY Brother -- Death due to natural cause, Brother dead Brother -- Coronary artery bypass grafting, Heart disease, Brother dead Father -- Father dead, Sudden death Adult Mother -- Mother dead, Hypertension Sister -- Sister alive and well Sister -- Sister alive with problem, Dementia/Alzheimers ____________________________ SOCIAL HISTORY Alcohol Use:  does not use alcohol;  Smoking:  never smoked;  Diet:  regular diet;  Lifestyle:  married;  Exercise:  some exercise;  Occupation:  retired;  Residence:  wife has dementia;  Job Description:  Social research officer, government;   ____________________________ REVIEW OF SYSTEMS General:  malaise and fatigue Eyes: cataracts Ears, Nose, Throat, Mouth:  denies any hearing loss,  epistaxis, hoarseness or difficulty  speaking. Respiratory: dyspnea with exertion Cardiovascular:  please review HPI Abdominal: denies dyspepsia, GI bleeding, constipation, or diarrhea Genitourinary-Male: nocturia  Musculoskeletal:  denies arthritis, venous insufficiency, or muscle weakness Neurological:  dizziness  ____________________________ PHYSICAL EXAMINATION VITAL SIGNS  Blood Pressure:  134/80 Sitting, Right arm, regular cuff  , 140/80 Standing, Right arm and regular cuff   Pulse:  72/min. Weight:  186.00 lbs. Height:  70"BMI: 26  Constitutional:  pleasant white male in no acute distress Skin:  warm and dry to touch, no apparent skin lesions, or masses noted. Head:  normocephalic, normal hair pattern, no masses or tenderness ENT:  ears, nose and throat reveal no gross abnormalities.  Dentition good. Neck:  supple, without massess. No JVD, thyromegaly or carotid bruits. Carotid upstroke normal. Chest:  normal symmetry, clear to auscultation., healed median sternotomy scar Cardiac:  irregularly irregular rhythm, normal S1 and S2, no S3 or S4, grade 2/6 systolic murmur at aortic area radiating to neck Abdomen:  abdomen soft,non-tender, no masses, no hepatospenomegaly, or aneurysm noted Peripheral Pulses:  the femoral,dorsalis pedis, and posterior tibial pulses are full and equal bilaterally with no bruits auscultated. Extremities & Back:  no deformities, clubbing, cyanosis, erythema or edema observed. Normal muscle strength and tone. Neurological:  no gross motor or sensory deficits noted, affect appropriate, oriented x3. ____________________________ MOST RECENT LIPID PANEL 12/07/15  CHOL TOTL 124 mg/dl, LDL 54 NM, HDL 57 mg/dl, TRIGLYCER 63 mg/dl, ALT 17 u/l, ALK PHOS 48 u/l, CHOL/HDL 2.2 (Calc), AST 19 u/l and VLDL 13 ____________________________ IMPRESSIONS/PLAN  1. Persistent atrial fibrillation with previous failed cardioversion 2. Mild to moderate aortic stenosis 3. Long-term use of anticoagulation 4. CAD with  previous bypass grafting 5. Symptomatic dyspnea and fatigue  Recommendations:  Cardioversion discussed with the patient including risks of stroke, arrhythmia, death, or anesthesia risks. The patient understands and is willing to proceed. He is to hold his metoprolol in the morning prior to cardioversion. He will continue his other medications.  ____________________________ TODAYS ORDERS  1. Comprehensive Metabolic Panel: Today  2. Complete Blood Count: Today  3. 12 Lead EKG: Today  4. Cardioversion tomorrow                     ____________________________ Cardiology Physician:  Kerry Hough MD [facility]

## 2017-04-10 ENCOUNTER — Ambulatory Visit (HOSPITAL_COMMUNITY)
Admission: RE | Admit: 2017-04-10 | Discharge: 2017-04-10 | Disposition: A | Discharge status: Home / Self Care | Payer: Medicare Other | Source: Ambulatory Visit | Attending: Cardiology | Admitting: Cardiology

## 2017-04-10 ENCOUNTER — Ambulatory Visit (HOSPITAL_COMMUNITY): Payer: Medicare Other | Admitting: Certified Registered Nurse Anesthetist

## 2017-04-10 ENCOUNTER — Encounter (HOSPITAL_COMMUNITY)
Admission: RE | Disposition: A | Discharge status: Home / Self Care | Payer: Self-pay | Source: Ambulatory Visit | Attending: Cardiology

## 2017-04-10 ENCOUNTER — Encounter (HOSPITAL_COMMUNITY): Payer: Self-pay | Admitting: *Deleted

## 2017-04-10 DIAGNOSIS — E785 Hyperlipidemia, unspecified: Secondary | ICD-10-CM | POA: Diagnosis not present

## 2017-04-10 DIAGNOSIS — I350 Nonrheumatic aortic (valve) stenosis: Secondary | ICD-10-CM | POA: Diagnosis not present

## 2017-04-10 DIAGNOSIS — I481 Persistent atrial fibrillation: Secondary | ICD-10-CM | POA: Insufficient documentation

## 2017-04-10 DIAGNOSIS — Z951 Presence of aortocoronary bypass graft: Secondary | ICD-10-CM | POA: Insufficient documentation

## 2017-04-10 DIAGNOSIS — M1990 Unspecified osteoarthritis, unspecified site: Secondary | ICD-10-CM | POA: Insufficient documentation

## 2017-04-10 DIAGNOSIS — I10 Essential (primary) hypertension: Secondary | ICD-10-CM | POA: Insufficient documentation

## 2017-04-10 DIAGNOSIS — I25119 Atherosclerotic heart disease of native coronary artery with unspecified angina pectoris: Secondary | ICD-10-CM | POA: Insufficient documentation

## 2017-04-10 DIAGNOSIS — Z7901 Long term (current) use of anticoagulants: Secondary | ICD-10-CM | POA: Diagnosis not present

## 2017-04-10 DIAGNOSIS — Z8249 Family history of ischemic heart disease and other diseases of the circulatory system: Secondary | ICD-10-CM | POA: Diagnosis not present

## 2017-04-10 HISTORY — PX: CARDIOVERSION: SHX1299

## 2017-04-10 SURGERY — CARDIOVERSION
Anesthesia: General

## 2017-04-10 MED ORDER — LIDOCAINE HCL (CARDIAC) 20 MG/ML IV SOLN
INTRAVENOUS | Status: DC | PRN
  Administered 2017-04-10: 20 mg via INTRAVENOUS

## 2017-04-10 MED ORDER — ONDANSETRON HCL 4 MG/2ML IJ SOLN: 4.0000 mg | Freq: Once | INTRAMUSCULAR | Status: DC | PRN

## 2017-04-10 MED ORDER — PROPOFOL 10 MG/ML IV BOLUS
INTRAVENOUS | Status: DC | PRN
  Administered 2017-04-10: 60 mg via INTRAVENOUS

## 2017-04-10 MED ORDER — METOPROLOL TARTRATE 50 MG PO TABS: 50.0000 mg | ORAL_TABLET | Freq: Two times a day (BID) | ORAL | Status: DC

## 2017-04-10 MED ORDER — METOPROLOL TARTRATE 50 MG PO TABS: 25.0000 mg | ORAL_TABLET | Freq: Two times a day (BID) | ORAL | Status: DC

## 2017-04-10 MED ORDER — HYDROCORTISONE 1 % EX CREA
1.0000 "application " | TOPICAL_CREAM | Freq: Three times a day (TID) | CUTANEOUS | Status: DC | PRN
  Filled 2017-04-10: qty 28

## 2017-04-10 MED ORDER — SODIUM CHLORIDE 0.9 % IV SOLN
INTRAVENOUS | Status: DC
  Administered 2017-04-10: 12:00:00 via INTRAVENOUS

## 2017-04-10 NOTE — Anesthesia Preprocedure Evaluation (Addendum)
Anesthesia Evaluation  Patient identified by MRN, date of birth, ID band Patient awake    Reviewed: Allergy & Precautions, NPO status , Patient's Chart, lab work & pertinent test results, reviewed documented beta blocker date and time   History of Anesthesia Complications Negative for: history of anesthetic complications  Airway Mallampati: II  TM Distance: >3 FB Neck ROM: Full    Dental  (+) Dental Advisory Given   Pulmonary neg pulmonary ROS,    breath sounds clear to auscultation       Cardiovascular hypertension, Pt. on medications and Pt. on home beta blockers (-) angina+ CAD and + CABG (2009)  + dysrhythmias Atrial Fibrillation  Rhythm:Irregular Rate:Normal  '15 Myoview: low risk scan. No evidence of ischemia. EF normal   Neuro/Psych negative neurological ROS     GI/Hepatic negative GI ROS, Neg liver ROS,   Endo/Other  negative endocrine ROS  Renal/GU negative Renal ROS     Musculoskeletal  (+) Arthritis , Osteoarthritis,    Abdominal   Peds  Hematology  (+) Blood dyscrasia (Xarelto), ,   Anesthesia Other Findings   Reproductive/Obstetrics                            Anesthesia Physical Anesthesia Plan  ASA: III  Anesthesia Plan: General   Post-op Pain Management:    Induction: Intravenous  Airway Management Planned: Mask and Natural Airway  Additional Equipment:   Intra-op Plan:   Post-operative Plan:   Informed Consent: I have reviewed the patients History and Physical, chart, labs and discussed the procedure including the risks, benefits and alternatives for the proposed anesthesia with the patient or authorized representative who has indicated his/her understanding and acceptance.   Dental advisory given  Plan Discussed with: CRNA and Surgeon  Anesthesia Plan Comments: (Plan routine monitors, GA for cardioversion)        Anesthesia Quick Evaluation

## 2017-04-10 NOTE — Discharge Instructions (Signed)
Electrical Cardioversion, Care After °This sheet gives you information about how to care for yourself after your procedure. Your health care provider may also give you more specific instructions. If you have problems or questions, contact your health care provider. °What can I expect after the procedure? °After the procedure, it is common to have: °· Some redness on the skin where the shocks were given. °Follow these instructions at home: °· Do not drive for 24 hours if you were given a medicine to help you relax (sedative). °· Take over-the-counter and prescription medicines only as told by your health care provider. °· Ask your health care provider how to check your pulse. Check it often. °· Rest for 48 hours after the procedure or as told by your health care provider. °· Avoid or limit your caffeine use as told by your health care provider. °Contact a health care provider if: °· You feel like your heart is beating too quickly or your pulse is not regular. °· You have a serious muscle cramp that does not go away. °Get help right away if: °· You have discomfort in your chest. °· You are dizzy or you feel faint. °· You have trouble breathing or you are short of breath. °· Your speech is slurred. °· You have trouble moving an arm or leg on one side of your body. °· Your fingers or toes turn cold or blue. °This information is not intended to replace advice given to you by your health care provider. Make sure you discuss any questions you have with your health care provider. °Document Released: 08/26/2013 Document Revised: 06/08/2016 Document Reviewed: 05/11/2016 °Elsevier Interactive Patient Education © 2017 [employer] ° °

## 2017-04-10 NOTE — Anesthesia Postprocedure Evaluation (Signed)
Anesthesia Post Note  Patient: Thomas Diaz  Procedure(s) Performed: Procedure(s) (LRB): CARDIOVERSION (N/A)  Patient location during evaluation: Endoscopy Anesthesia Type: General Level of consciousness: patient cooperative, oriented and sedated Pain management: pain level controlled Vital Signs Assessment: post-procedure vital signs reviewed and stable Respiratory status: spontaneous breathing, nonlabored ventilation and respiratory function stable Cardiovascular status: blood pressure returned to baseline and stable Postop Assessment: no signs of nausea or vomiting Anesthetic complications: no       Last Vitals:  Vitals:   04/10/17 1226 04/10/17 1230  BP: (!) 112/58 (!) 100/50  Pulse: (!) 54 (!) 56  Resp: 16 (!) 22  Temp:      Last Pain:  Vitals:   04/10/17 1226  TempSrc: Oral                 Anju Sereno,E. Esiah Bazinet

## 2017-04-10 NOTE — Transfer of Care (Signed)
Immediate Anesthesia Transfer of Care Note  Patient: [name]  Procedure(s) Performed: Procedure(s): CARDIOVERSION (N/A)  Patient Location: PACU and Endoscopy Unit  Anesthesia Type:General  Level of Consciousness: awake and alert   Airway & Oxygen Therapy: Patient Spontanous Breathing and Patient connected to nasal cannula oxygen  Post-op Assessment: Report given to RN and Post -op Vital signs reviewed and stable  Post vital signs: Reviewed and stable  Last Vitals:  Vitals:   04/10/17 1149 04/10/17 1226  BP: (!) 164/107 (!) 112/58  Pulse: 80 (!) 54  Resp: 20 16  Temp: 36.7 C     Last Pain:  Vitals:   04/10/17 1226  TempSrc: Oral         Complications: No apparent anesthesia complications

## 2017-04-10 NOTE — CV Procedure (Signed)
Electrical Cardioversion Procedure Note  Thomas Diaz C Kazee   83 y.o. male MRN: [id number] DOB: 11-25-1931  Today's date: 04/10/2017  Procedure: Electrical Cardioversion  Indications:  Atrial Fibrillation  Time Out: Verified patient identification, verified procedure,medications/allergies/relevent history reviewed, required imaging and test results available.  Performed  Procedure Details  The patient was NPO after midnight. Anesthesia was administered at the beside  [facility]well Jackson with 20 mg of lidocaine and 60 mg of propofol.  Cardioversion was done with synchronized biphasic defibrillation with AP pads with 120 and then 150 watts.  The patient converted to normal sinus rhythm after the second shock. The patient tolerated the procedure well   IMPRESSION:  Successful cardioversion of atrial fibrillation    W. Viann FishSpencer Trisha Morandi, Montez HagemanJr. MD [facility]   04/10/2017, 12:31 PM

## 2017-04-11 ENCOUNTER — Encounter (HOSPITAL_COMMUNITY): Payer: Self-pay | Admitting: Cardiology

## 2017-05-07 ENCOUNTER — Observation Stay (HOSPITAL_COMMUNITY): Payer: Medicare Other

## 2017-05-07 ENCOUNTER — Encounter (HOSPITAL_COMMUNITY): Payer: Self-pay | Admitting: Cardiology

## 2017-05-07 ENCOUNTER — Observation Stay (HOSPITAL_COMMUNITY)
Admission: AD | Admit: 2017-05-07 | Discharge: 2017-05-09 | Disposition: A | Discharge status: Home / Self Care | Payer: Medicare Other | Source: Ambulatory Visit | Attending: Cardiology | Admitting: Cardiology

## 2017-05-07 DIAGNOSIS — I110 Hypertensive heart disease with heart failure: Secondary | ICD-10-CM | POA: Insufficient documentation

## 2017-05-07 DIAGNOSIS — I700 Atherosclerosis of aorta: Secondary | ICD-10-CM | POA: Insufficient documentation

## 2017-05-07 DIAGNOSIS — E785 Hyperlipidemia, unspecified: Secondary | ICD-10-CM | POA: Diagnosis not present

## 2017-05-07 DIAGNOSIS — I2584 Coronary atherosclerosis due to calcified coronary lesion: Secondary | ICD-10-CM | POA: Diagnosis not present

## 2017-05-07 DIAGNOSIS — I491 Atrial premature depolarization: Secondary | ICD-10-CM | POA: Insufficient documentation

## 2017-05-07 DIAGNOSIS — Z7901 Long term (current) use of anticoagulants: Secondary | ICD-10-CM | POA: Diagnosis not present

## 2017-05-07 DIAGNOSIS — M1990 Unspecified osteoarthritis, unspecified site: Secondary | ICD-10-CM | POA: Insufficient documentation

## 2017-05-07 DIAGNOSIS — I2582 Chronic total occlusion of coronary artery: Secondary | ICD-10-CM | POA: Insufficient documentation

## 2017-05-07 DIAGNOSIS — I5032 Chronic diastolic (congestive) heart failure: Secondary | ICD-10-CM | POA: Diagnosis not present

## 2017-05-07 DIAGNOSIS — N400 Enlarged prostate without lower urinary tract symptoms: Secondary | ICD-10-CM | POA: Diagnosis not present

## 2017-05-07 DIAGNOSIS — Z951 Presence of aortocoronary bypass graft: Secondary | ICD-10-CM | POA: Diagnosis not present

## 2017-05-07 DIAGNOSIS — M109 Gout, unspecified: Secondary | ICD-10-CM | POA: Insufficient documentation

## 2017-05-07 DIAGNOSIS — I481 Persistent atrial fibrillation: Secondary | ICD-10-CM | POA: Insufficient documentation

## 2017-05-07 DIAGNOSIS — I200 Unstable angina: Secondary | ICD-10-CM

## 2017-05-07 DIAGNOSIS — I25119 Atherosclerotic heart disease of native coronary artery with unspecified angina pectoris: Secondary | ICD-10-CM | POA: Insufficient documentation

## 2017-05-07 DIAGNOSIS — I272 Pulmonary hypertension, unspecified: Secondary | ICD-10-CM | POA: Insufficient documentation

## 2017-05-07 DIAGNOSIS — I352 Nonrheumatic aortic (valve) stenosis with insufficiency: Secondary | ICD-10-CM | POA: Diagnosis present

## 2017-05-07 DIAGNOSIS — R079 Chest pain, unspecified: Secondary | ICD-10-CM

## 2017-05-07 HISTORY — DX: Other persistent atrial fibrillation: I48.19

## 2017-05-07 HISTORY — DX: Atherosclerotic heart disease of native coronary artery without angina pectoris: I25.10

## 2017-05-07 HISTORY — DX: Gilbert syndrome: E80.4

## 2017-05-07 HISTORY — DX: Benign prostatic hyperplasia without lower urinary tract symptoms: N40.0

## 2017-05-07 HISTORY — DX: Nonrheumatic aortic (valve) stenosis: I35.0

## 2017-05-07 LAB — CBC WITH DIFFERENTIAL/PLATELET
Basophils Absolute: 0 10*3/uL (ref 0.0–0.1)
Basophils Relative: 0 %
Eosinophils Absolute: 0.1 10*3/uL (ref 0.0–0.7)
Eosinophils Relative: 2 %
HCT: 40.2 % (ref 39.0–52.0)
HEMOGLOBIN: 13 g/dL (ref 13.0–17.0)
LYMPHS ABS: 0.9 10*3/uL (ref 0.7–4.0)
LYMPHS PCT: 17 %
MCH: 30.4 pg (ref 26.0–34.0)
MCHC: 32.3 g/dL (ref 30.0–36.0)
MCV: 93.9 fL (ref 78.0–100.0)
Monocytes Absolute: 0.5 10*3/uL (ref 0.1–1.0)
Monocytes Relative: 9 %
NEUTROS ABS: 3.5 10*3/uL (ref 1.7–7.7)
NEUTROS PCT: 72 %
Platelets: 134 10*3/uL — ABNORMAL LOW (ref 150–400)
RBC: 4.28 MIL/uL (ref 4.22–5.81)
RDW: 14.6 % (ref 11.5–15.5)
WBC: 5 10*3/uL (ref 4.0–10.5)

## 2017-05-07 LAB — COMPREHENSIVE METABOLIC PANEL
ALBUMIN: 3.7 g/dL (ref 3.5–5.0)
ALT: 21 U/L (ref 17–63)
AST: 26 U/L (ref 15–41)
Alkaline Phosphatase: 79 U/L (ref 38–126)
Anion gap: 7 (ref 5–15)
BUN: 18 mg/dL (ref 6–20)
CO2: 24 mmol/L (ref 22–32)
Calcium: 8.8 mg/dL — ABNORMAL LOW (ref 8.9–10.3)
Chloride: 108 mmol/L (ref 101–111)
Creatinine, Ser: 1.06 mg/dL (ref 0.61–1.24)
GFR calc Af Amer: >60 mL/min (ref >60)
GFR calc non Af Amer: >60 mL/min (ref >60)
GLUCOSE: 172 mg/dL — AB (ref 65–99)
POTASSIUM: 3.5 mmol/L (ref 3.5–5.1)
SODIUM: 139 mmol/L (ref 135–145)
Total Bilirubin: 3.1 mg/dL — ABNORMAL HIGH (ref 0.3–1.2)
Total Protein: 6.7 g/dL (ref 6.5–8.1)

## 2017-05-07 LAB — TROPONIN I
Troponin I: <0.03 ng/mL (ref <0.03)
Troponin I: <0.03 ng/mL (ref <0.03)

## 2017-05-07 MED ORDER — SODIUM CHLORIDE 0.9 % IV SOLN: 250.0000 mL | INTRAVENOUS | Status: DC | PRN

## 2017-05-07 MED ORDER — TAMSULOSIN HCL 0.4 MG PO CAPS
0.4000 mg | ORAL_CAPSULE | Freq: Every day | ORAL | Status: DC
  Filled 2017-05-07: qty 1

## 2017-05-07 MED ORDER — SODIUM CHLORIDE 0.9% FLUSH
3.0000 mL | Freq: Two times a day (BID) | INTRAVENOUS | Status: DC
  Administered 2017-05-07 – 2017-05-08 (×4): 3 mL via INTRAVENOUS

## 2017-05-07 MED ORDER — ONDANSETRON HCL 4 MG/2ML IJ SOLN: 4.0000 mg | Freq: Four times a day (QID) | INTRAMUSCULAR | Status: DC | PRN

## 2017-05-07 MED ORDER — POTASSIUM CHLORIDE CRYS ER 20 MEQ PO TBCR
40.0000 meq | EXTENDED_RELEASE_TABLET | Freq: Once | ORAL | Status: AC
  Administered 2017-05-07: 40 meq via ORAL
  Filled 2017-05-07: qty 2

## 2017-05-07 MED ORDER — SODIUM CHLORIDE 0.9% FLUSH: 3.0000 mL | INTRAVENOUS | Status: DC | PRN

## 2017-05-07 MED ORDER — METOPROLOL TARTRATE 25 MG PO TABS
25.0000 mg | ORAL_TABLET | Freq: Two times a day (BID) | ORAL | Status: DC
  Administered 2017-05-08 – 2017-05-09 (×3): 25 mg via ORAL
  Filled 2017-05-07 (×3): qty 1

## 2017-05-07 MED ORDER — LOSARTAN POTASSIUM 50 MG PO TABS
50.0000 mg | ORAL_TABLET | Freq: Every day | ORAL | Status: DC
  Administered 2017-05-08 – 2017-05-09 (×2): 50 mg via ORAL
  Filled 2017-05-07 (×2): qty 1

## 2017-05-07 MED ORDER — ACETAMINOPHEN 325 MG PO TABS: 650.0000 mg | ORAL_TABLET | ORAL | Status: DC | PRN

## 2017-05-07 MED ORDER — DIAZEPAM 5 MG PO TABS
5.0000 mg | ORAL_TABLET | Freq: Every evening | ORAL | Status: DC | PRN
  Administered 2017-05-08: 5 mg via ORAL
  Filled 2017-05-07: qty 1

## 2017-05-07 MED ORDER — NITROGLYCERIN 0.4 MG SL SUBL
0.4000 mg | SUBLINGUAL_TABLET | SUBLINGUAL | Status: DC | PRN
Start: 2017-05-07 — End: 2017-05-09

## 2017-05-07 MED ORDER — AMIODARONE HCL 200 MG PO TABS
200.0000 mg | ORAL_TABLET | Freq: Every day | ORAL | Status: DC
  Administered 2017-05-08 – 2017-05-09 (×2): 200 mg via ORAL
  Filled 2017-05-07 (×2): qty 1

## 2017-05-07 MED ORDER — ASPIRIN EC 81 MG PO TBEC
81.0000 mg | DELAYED_RELEASE_TABLET | Freq: Every day | ORAL | Status: DC
  Administered 2017-05-08 – 2017-05-09 (×2): 81 mg via ORAL
  Filled 2017-05-07 (×2): qty 1

## 2017-05-07 MED ORDER — ROSUVASTATIN CALCIUM 10 MG PO TABS: 5.0000 mg | ORAL_TABLET | ORAL | Status: DC

## 2017-05-07 NOTE — Progress Notes (Signed)
Patient's initial troponin is negative.  He is not having a lot of chest pain at the present time.  Other lab is unremarkable chest x-ray shows a possible small left pleural effusion.  I had held his Xarelto tonight until I saw what his enzymes looked like however the patient went ahead and took a Xarelto have his own on his own tonight.  So will likely not be able do a cath tomorrow if we were to proceed that direction.  Echo not done and we'll ask him to be done early in the morning to assess the aortic stenosis.  Potassium is mildly low and will replete.  Darden PalmerW. Spencer Tilley, Jr. MD [facility] 7:53 PM

## 2017-05-07 NOTE — H&P (Addendum)
History and Physical   Admit date: 05/07/2017 Name:  Thomas Diaz Medical record number: [id number] DOB/Age:  1932/04/22  83 y.o. male  Primary Cardiologist: Donnie Aho  Primary Physician:   Bill Salinas, PA  Chief complaint/reason for admission: Chest pain  HPI:  This very nice 83 year old male has a history of coronary artery disease with bypass grafting in 2009.  He also had previous aortic stenosis.  He switched care to me in December of this year.  He had had recurrence of atrial fibrillation and had been on Xarelto but developed persistent atrial fibrillation around the first part of the year associated with markedly reduced exercise capacity.  He did not feel well and attempts were made for cardioversion.  An echocardiogram done the first part of the year showed at least moderate aortic stenosis and could be more severe.  He had cardioversion done on April 23 but reverted back to sinus rhythm in one week.  After that the decision was made to try to convert him to sinus rhythm again following antiarrhythmic drug loading and after discussion of risks of amiodarone versus Tikosyn opted to go with amiodarone.  After a time of amiodarone loading he underwent cardioversion on May 23 and has maintained sinus rhythm since then.  He noted marked improvement in his ability to exercise and do normal working.  He did have sinus nasal drainage and difficulty with cough and saw about a week ago his PA who placed him on Augmentin as well as Zyrtec.  His voice has not been strong and he has been weaker.  Last evening after going to bed he developed upper left anterior chest discomfort which was a new finding for him.  It persisted through the evening and lasted around 6 hours but was gone this morning.  He scheduled an appointment to see me in the office and when seen in the office appear to be much weaker than he had been and because of the prolonged chest pain is brought in to the hospital for evaluation.  He  has been on Xarelto because of atrial fibrillation and EKG in the office showed him to be in sinus rhythm.  He has not been having exertional chest pain and did note improvement in his exercise tolerance and duration following the maintenance of sinus rhythm.  Today is his 58th wedding anniversary.  His wife is severely demented and is in a separate living facility.  His daughter was concerned about him as he was talking about making financial arrangements and he has been overall concerned about his health and has been somewhat weaker.  No severe shortness of breath.    Past Medical History:  Diagnosis Date  . Aortic stenosis 07/06/2013   Echocardiogram on 06/19/13 and showed mild to moderate aortic stenosis.  His ejection fraction is 55-60%.   Marland Kitchen BPH (benign prostatic hyperplasia) 05/30/2011  . CAD (coronary artery disease), native coronary artery 03/07/2017   Cath 2009 with 60% LM and 95% RCA  CABG 6//19/09 with LIMA to LAD, SVG to RCA, SVG to int-OM and SVG to Dx Dr. Tyrone Sage  . ED (erectile dysfunction)   . Gilbert syndrome 12/16/2015  . Gout   . Hyperlipidemia   . Hypertension   . OA (osteoarthritis)   . PAC (premature atrial contraction)   . Persistent atrial fibrillation (HCC) 06/16/2013   CHA2DS2VASC score 4 Cardioversion 03/07/17 with reversion in one week Cardioversion 04/10/17 after loading with amiodarone with sinus       Past Surgical  History:  Procedure Laterality Date  . CARDIOVASCULAR STRESS TEST  11/03/04   EF 66%  . CARDIOVERSION N/A 03/07/2017   Procedure: CARDIOVERSION;  Surgeon: [name], MD;  Location: [facility] ENDOSCOPY;  Service: Cardiovascular;  Laterality: N/A;  . CARDIOVERSION N/A 04/10/2017   Procedure: CARDIOVERSION;  Surgeon: [name], MD;  Location: [facility] ENDOSCOPY;  Service: Cardiovascular;  Laterality: N/A;  . COLONOSCOPY  2000  . CORONARY ARTERY BYPASS GRAFT  04/2008  . TONSILLECTOMY AND ADENOIDECTOMY    . Allergies: is allergic to lisinopril.    Medications: Prior to Admission medications   Medication Sig Start Date End Date Taking? Authorizing Provider  amiodarone (PACERONE) 200 MG tablet Take 200 mg by mouth daily.    [provider]  diazepam (VALIUM) 5 MG tablet Take 5 mg by mouth at bedtime as needed (sleep).    [provider]  losartan (COZAAR) 50 MG tablet Take 1 tablet (50 mg total) by mouth daily. 06/01/15   Cassell Clement, MD  metoprolol tartrate (LOPRESSOR) 50 MG tablet Take 0.5 tablets (25 mg total) by mouth 2 (two) times daily. 04/10/17   [name], MD  Rivaroxaban (XARELTO) 20 MG TABS tablet Take 1 tablet (20 mg total) by mouth daily with supper. 09/15/16   Melene Plan, DO  rosuvastatin (CRESTOR) 5 MG tablet Take 1 tablet (5 mg total) by mouth 4 (four) times a week. FOUR times a week 06/30/15   Cassell Clement, MD  silodosin (RAPAFLO) 8 MG CAPS capsule Take 8 mg by mouth as needed (difficulty urination). As needed 05/30/11   Cassell Clement, MD   Family History:  Family Status  Relation Status  . Mother Deceased at age 7  . Father Deceased at age 85  . Sister Alive  . Brother Alive  . Sister Alive  . Brother Alive   Social History:   reports that he has never smoked. He has never used smokeless tobacco. He reports that he does not drink alcohol or use drugs.   Married 58 years, wife currently has dementia.  Retired businessman   Review of Systems: He has had some episodes where he has been unsteady on his feet but has not had any significant syncope.  He did have an episode where he fell several weeks ago where he stated that he tripped over the grass.  He does have some symptoms of BPH and prior history of VD.  Does have osteoarthritis of his knees. Other than as noted above, the remainder of the review of systems is normal  Physical Exam: BP (!) 159/89   Pulse 67   Temp 97.4 F (36.3 C) (Oral)   Resp 19   Ht 5\' 10"  (1.778 m)   Wt 82.4 kg (181 lb 9.6 oz)   SpO2 95%   BMI  26.06 kg/m  General appearance: He is a pleasant elderly male who appeared mildly frail compared to his usual status  Head: He is a pleasant elderly male who appeared mildly frail compared to his usual status Eyes: conjunctivae/corneas clear. PERRL, EOM's intact. Fundi not examined Neck: no adenopathy, no carotid bruit, no JVD and supple, symmetrical, trachea midline Lungs: clear to auscultation bilaterally Heart: Regular rate and rhythm, normal S1 and S2, no S3, high-pitched harsh systolic murmur across the aortic valve and down the left sternal border with some radiation to the neck, Abdomen: soft, non-tender; bowel sounds normal; no masses,  no organomegaly Rectal: deferred Extremities: Extremities normal appearance, there is 1+ peripheral edema  noted Pulses: 2+ and symmetric Skin: Skin color, texture, turgor normal. No rashes or lesions Neurologic: Grossly normal   Labs: All pending at the time of dictation EKG: Normal sinus rhythm, T-wave inversions across the anterior leads Independently reviewed by me  Radiology: Pending   IMPRESSIONS: 1.  Prolonged chest pain possible unstable angina 2.  Persistent atrial fibrillation with recent cardioversion currently maintaining sinus rhythm 3.  Coronary artery disease with previous bypass grafting 9 years ago 4.  Aortic stenosis at least moderate and could be possibly more severe responsible for symptoms. 5.  Hyperlipidemia 6.  BPH  PLAN: Obtain serial troponins, obtain repeat echocardiogram to assess severity of aortic stenosis.  He will have serial EKGs and may need to have repeat catheterization to assess for graft disease as well as further look at aortic stenosis as a cause of his recent symptoms.    Signed: Darden PalmerW. Spencer Webster Patrone, Jr. MD [facility] Cardiology  05/07/2017, 7:31 PM

## 2017-05-08 ENCOUNTER — Observation Stay (HOSPITAL_COMMUNITY): Payer: Medicare Other

## 2017-05-08 DIAGNOSIS — I2582 Chronic total occlusion of coronary artery: Secondary | ICD-10-CM | POA: Diagnosis not present

## 2017-05-08 DIAGNOSIS — I25119 Atherosclerotic heart disease of native coronary artery with unspecified angina pectoris: Secondary | ICD-10-CM | POA: Diagnosis not present

## 2017-05-08 DIAGNOSIS — I352 Nonrheumatic aortic (valve) stenosis with insufficiency: Secondary | ICD-10-CM | POA: Diagnosis not present

## 2017-05-08 DIAGNOSIS — I2584 Coronary atherosclerosis due to calcified coronary lesion: Secondary | ICD-10-CM | POA: Diagnosis not present

## 2017-05-08 LAB — ECHOCARDIOGRAM COMPLETE
HEIGHTINCHES: 70 in
Weight: 2924.18 oz

## 2017-05-08 LAB — BASIC METABOLIC PANEL
ANION GAP: 8 (ref 5–15)
BUN: 19 mg/dL (ref 6–20)
CALCIUM: 8.8 mg/dL — AB (ref 8.9–10.3)
CO2: 24 mmol/L (ref 22–32)
Chloride: 106 mmol/L (ref 101–111)
Creatinine, Ser: 1.17 mg/dL (ref 0.61–1.24)
GFR, EST NON AFRICAN AMERICAN: 56 mL/min — AB (ref >60)
Glucose, Bld: 113 mg/dL — ABNORMAL HIGH (ref 65–99)
Potassium: 3.8 mmol/L (ref 3.5–5.1)
SODIUM: 138 mmol/L (ref 135–145)

## 2017-05-08 LAB — TROPONIN I: Troponin I: <0.03 ng/mL (ref <0.03)

## 2017-05-08 MED ORDER — PERFLUTREN LIPID MICROSPHERE
1.0000 mL | INTRAVENOUS | Status: AC | PRN
  Administered 2017-05-08: 2 mL via INTRAVENOUS
  Filled 2017-05-08: qty 10

## 2017-05-08 NOTE — Progress Notes (Signed)
Subjective:  His chest discomfort has improved.  He remains in sinus rhythm.  Echocardiogram personally reviewed by me today shows severe aortic stenosis with peak gradient of 69 mm and a mean gradient of 37 mm and severe aortic stenosis.  LV function is normal.  He has mild aortic regurgitation. recent symptoms of near syncope as well as exertional dyspnea despite inversion to sinus rhythm.  He still complains of a cough.    Objective:  Vital Signs in the last 24 hours: BP (!) 163/83 (BP Location: Left Arm)   Pulse 62   Temp 98.2 F (36.8 C) (Oral)   Resp 18   Ht 5' 10" (1.778 m)   Wt 82.9 kg (182 lb 12.2 oz)   SpO2 98%   BMI 26.22 kg/m   Physical Exam:  pleasant male in no acute distress  Lungs:  Clear Cardiac:  Regular rhythm, normal S1 and S2, no S3, harsh 2/6 high-pitched murmur BS radiating to carotids  Abdomen:  Soft, nontender, no masses  extremities: Trace edema present  Intake/Output from previous day: 06/19 0701 - 06/20 0700 In: 150 [P.O.:150] Out: -   Weight Filed Weights   05/07/17 1230 05/08/17 0534  Weight: 82.4 kg (181 lb 9.6 oz) 82.9 kg (182 lb 12.2 oz)    Lab Results: Basic Metabolic Panel:  Recent Labs  05/07/17 1257 05/08/17 0137  NA 139 138  K 3.5 3.8  CL 108 106  CO2 24 24  GLUCOSE 172* 113*  BUN 18 19  CREATININE 1.06 1.17   CBC:  Recent Labs  05/07/17 1257  WBC 5.0  NEUTROABS 3.5  HGB 13.0  HCT 40.2  MCV 93.9  PLT 134*   Cardiac Enzymes:  Recent Labs  05/07/17 1257 05/07/17 1827 05/08/17 0137  TROPONINI <0.03 <0.03 <0.03    Telemetry: Personally reviewed.  Normal sinus rhythm without recurrence of atrial fibrillation  Assessment/Plan:   1.  Chest discomfort with some atypical features with negative enzymes-MI ruled out 2.  CAD with previous bypass grafting 3.  Severe aortic stenosis by echo 4.  Hypertension 5.  Paroxysmal atrial fibrillation currently in sinus rhythm following repeat cardioversion on May  23  Recommendations:  Discussion with patient and daughter as well as Dr. Michael Diaz.  Dr. Cooper will see the patient in consultation tomorrow and have recommended that he have a cardiac catheterization to assess his grafts as well as his hemodynamics in anticipation of TAVR.  I believe the patient is symptomatic with severe aortic stenosis and would benefit from this. Cardiac catheterization was discussed with the patient fully including risks of myocardial infarction, death, stroke, bleeding, arrhythmia, dye allergy, renal insufficiency or bleeding.  The patient understands and is willing to proceed.  Following the catheterization and Dr. Cooper's consultation he could probably be discharged home to continue his other evaluation as an outpatient.  I will hold his Xarelto tonight.  It will have been 28 days since his cardioversion and I think we probably don't need to cover him with heparin.  Thomas Diaz, Jr.  MD F[facility] Cardiology  05/08/2017, 2:02 PM   

## 2017-05-08 NOTE — Care Management Note (Signed)
Case Management Note  Patient Details  Name: Thomas Diaz MRN: [id number] Date of Birth: 09/19/1932  Subjective/Objective:  Pt presented for Chest Pain. Plan for Cardiac Cath 05-09-17. Pt is from [facility] and plans to return once stable. Per pt he still drives and is independent.                    Action/Plan: No home needs identified at time of visit.   Expected Discharge Date:  05/09/17               Expected Discharge Plan:  Home/Self Care ([facility])  In-House Referral:  NA  Discharge planning Services  CM Consult  Post Acute Care Choice:  NA Choice offered to:  NA  DME Arranged:  N/A DME Agency:  NA  HH Arranged:  NA HH Agency:  NA  Status of Service:  Completed, signed off  If discussed at Long Length of Stay Meetings, dates discussed:    Additional Comments:  Gala LewandowskyGraves-Bigelow, Haelyn Forgey Kaye, RN 05/08/2017, 5:04 PM

## 2017-05-08 NOTE — Progress Notes (Signed)
  Echocardiogram 2D Echocardiogram with Definity has been performed.  Thomas SavoyCasey N Lennin Osmond 05/08/2017, 12:16 PM

## 2017-05-08 NOTE — Care Management Obs Status (Signed)
MEDICARE OBSERVATION STATUS NOTIFICATION   Patient Details  Name: Thomas Diaz MRN: [id number] Date of Birth: 1932/06/28   Medicare Observation Status Notification Given:  Yes    Gala LewandowskyGraves-Bigelow, Juda Toepfer Kaye, RN 05/08/2017, 5:03 PM

## 2017-05-09 ENCOUNTER — Encounter (HOSPITAL_COMMUNITY)
Admission: AD | Disposition: A | Discharge status: Home / Self Care | Payer: Self-pay | Source: Ambulatory Visit | Attending: Cardiology

## 2017-05-09 ENCOUNTER — Encounter (HOSPITAL_COMMUNITY): Payer: Self-pay | Admitting: Cardiovascular Disease

## 2017-05-09 DIAGNOSIS — I352 Nonrheumatic aortic (valve) stenosis with insufficiency: Secondary | ICD-10-CM | POA: Diagnosis not present

## 2017-05-09 DIAGNOSIS — I350 Nonrheumatic aortic (valve) stenosis: Secondary | ICD-10-CM

## 2017-05-09 HISTORY — PX: RIGHT/LEFT HEART CATH AND CORONARY/GRAFT ANGIOGRAPHY: CATH118267

## 2017-05-09 LAB — POCT I-STAT 3, ART BLOOD GAS (G3+)
ACID-BASE DEFICIT: 1 mmol/L (ref 0.0–2.0)
Bicarbonate: 24.4 mmol/L (ref 20.0–28.0)
O2 SAT: 97 %
TCO2: 26 mmol/L (ref 0–100)
pCO2 arterial: 42.7 mmHg (ref 32.0–48.0)
pH, Arterial: 7.365 (ref 7.350–7.450)
pO2, Arterial: 99 mmHg (ref 83.0–108.0)

## 2017-05-09 LAB — POCT I-STAT 3, VENOUS BLOOD GAS (G3P V)
BICARBONATE: 24.6 mmol/L (ref 20.0–28.0)
O2 Saturation: 66 %
TCO2: 26 mmol/L (ref 0–100)
pCO2, Ven: 39.6 mmHg — ABNORMAL LOW (ref 44.0–60.0)
pH, Ven: 7.401 (ref 7.250–7.430)
pO2, Ven: 34 mmHg (ref 32.0–45.0)

## 2017-05-09 SURGERY — RIGHT/LEFT HEART CATH AND CORONARY/GRAFT ANGIOGRAPHY
Anesthesia: LOCAL

## 2017-05-09 MED ORDER — SODIUM CHLORIDE 0.9% FLUSH: 3.0000 mL | Freq: Two times a day (BID) | INTRAVENOUS | Status: DC

## 2017-05-09 MED ORDER — HEPARIN (PORCINE) IN NACL 2-0.9 UNIT/ML-% IJ SOLN
INTRAMUSCULAR | Status: AC | PRN
  Administered 2017-05-09: 1000 mL

## 2017-05-09 MED ORDER — ASPIRIN 81 MG PO CHEW: 81.0000 mg | CHEWABLE_TABLET | Freq: Every day | ORAL | Status: DC

## 2017-05-09 MED ORDER — SODIUM CHLORIDE 0.9 % IV SOLN: 250.0000 mL | INTRAVENOUS | Status: DC | PRN

## 2017-05-09 MED ORDER — HEPARIN (PORCINE) IN NACL 2-0.9 UNIT/ML-% IJ SOLN
INTRAMUSCULAR | Status: AC
  Filled 2017-05-09: qty 1000

## 2017-05-09 MED ORDER — FENTANYL CITRATE (PF) 100 MCG/2ML IJ SOLN
INTRAMUSCULAR | Status: AC
  Filled 2017-05-09: qty 2

## 2017-05-09 MED ORDER — MIDAZOLAM HCL 2 MG/2ML IJ SOLN
INTRAMUSCULAR | Status: DC | PRN
  Administered 2017-05-09: 1 mg via INTRAVENOUS

## 2017-05-09 MED ORDER — IOPAMIDOL (ISOVUE-370) INJECTION 76%
INTRAVENOUS | Status: DC | PRN
  Administered 2017-05-09: 120 mL via INTRA_ARTERIAL

## 2017-05-09 MED ORDER — FENTANYL CITRATE (PF) 100 MCG/2ML IJ SOLN
INTRAMUSCULAR | Status: DC | PRN
Start: 2017-05-09 — End: 2017-05-09
  Administered 2017-05-09: 25 ug via INTRAVENOUS

## 2017-05-09 MED ORDER — SODIUM CHLORIDE 0.9% FLUSH: 3.0000 mL | INTRAVENOUS | Status: DC | PRN

## 2017-05-09 MED ORDER — DIAZEPAM 5 MG PO TABS: 5.0000 mg | ORAL_TABLET | Freq: Four times a day (QID) | ORAL | Status: DC | PRN

## 2017-05-09 MED ORDER — IOPAMIDOL (ISOVUE-370) INJECTION 76%
INTRAVENOUS | Status: AC
  Filled 2017-05-09: qty 100

## 2017-05-09 MED ORDER — LIDOCAINE HCL (PF) 1 % IJ SOLN
INTRAMUSCULAR | Status: DC | PRN
  Administered 2017-05-09: 18 mL

## 2017-05-09 MED ORDER — SODIUM CHLORIDE 0.9 % WEIGHT BASED INFUSION: 3.0000 mL/kg/h | INTRAVENOUS | Status: DC

## 2017-05-09 MED ORDER — SODIUM CHLORIDE 0.9 % WEIGHT BASED INFUSION
3.0000 mL/kg/h | INTRAVENOUS | Status: DC
  Administered 2017-05-09: 3 mL/kg/h via INTRAVENOUS

## 2017-05-09 MED ORDER — ONDANSETRON HCL 4 MG/2ML IJ SOLN: 4.0000 mg | Freq: Four times a day (QID) | INTRAMUSCULAR | Status: DC | PRN

## 2017-05-09 MED ORDER — ACETAMINOPHEN 325 MG PO TABS: 650.0000 mg | ORAL_TABLET | ORAL | Status: DC | PRN

## 2017-05-09 MED ORDER — LIDOCAINE HCL 1 % IJ SOLN
INTRAMUSCULAR | Status: AC
  Filled 2017-05-09: qty 20

## 2017-05-09 MED ORDER — IOPAMIDOL (ISOVUE-370) INJECTION 76%
INTRAVENOUS | Status: AC
  Filled 2017-05-09: qty 125

## 2017-05-09 MED ORDER — MIDAZOLAM HCL 2 MG/2ML IJ SOLN
INTRAMUSCULAR | Status: AC
  Filled 2017-05-09: qty 2

## 2017-05-09 MED ORDER — SODIUM CHLORIDE 0.9 % WEIGHT BASED INFUSION: 1.0000 mL/kg/h | INTRAVENOUS | Status: DC

## 2017-05-09 SURGICAL SUPPLY — 14 items
CATH INFINITI 5FR MULTPACK ANG (CATHETERS) ×1 IMPLANT
CATH SWAN GANZ 7F STRAIGHT (CATHETERS) ×1 IMPLANT
KIT HEART LEFT (KITS) ×2 IMPLANT
KIT HEART RIGHT NAMIC (KITS) ×1 IMPLANT
PACK CARDIAC CATHETERIZATION (CUSTOM PROCEDURE TRAY) ×2 IMPLANT
SHEATH PINNACLE 5F 10CM (SHEATH) ×1 IMPLANT
SHEATH PINNACLE 6F 10CM (SHEATH) ×1 IMPLANT
SHEATH PINNACLE 7F 10CM (SHEATH) ×1 IMPLANT
SYR MEDRAD MARK V 150ML (SYRINGE) ×2 IMPLANT
TRANSDUCER W/STOPCOCK (MISCELLANEOUS) ×3 IMPLANT
TUBING ART PRESS 72  MALE/FEM (TUBING) ×1
TUBING ART PRESS 72 MALE/FEM (TUBING) IMPLANT
WIRE EMERALD 3MM-J .035X150CM (WIRE) ×1 IMPLANT
WIRE EMERALD ST .035X150CM (WIRE) ×1 IMPLANT

## 2017-05-09 NOTE — Consult Note (Signed)
Cardiology Consultation:   Patient ID: Thomas Diaz; [id number]; 10-28-1932   Admit date: 05/07/2017 Date of Consult: 05/09/2017  Primary Care Provider: [name], MD Primary Cardiologist: Dr Donnie Aho  Patient Profile:   Thomas Diaz is a 83 y.o. male with a hx of CAD s/p CABG who is being seen today for the evaluation of severe aortic stenosis at the request of Dr Donnie Aho.  History of Present Illness:   Thomas Diaz is an 83 year old gentleman who underwent multivessel CABG in 2009. He has been followed by Dr. Donnie Aho with aortic stenosis, previously seen by Dr. Patty Sermons. The patient had done well since his CABG until he developed atrial fibrillation several months ago with markedly reduced exercise tolerance. He underwent cardioversion and ultimately required amiodarone with repeated cardioversion and is now maintaining sinus rhythm. The patient contacted Dr. Donnie Aho with chest discomfort and hospitalization was recommended. Since hospitalization an echocardiogram is done and demonstrates findings consistent with severe aortic stenosis. Consultation is requested to review treatment options.  The patient is interviewed after he has returned to his room after cardiac cath is completed. He is doing well at the present time, currently with no symptoms at rest. Chest discomfort has not returned. The patient has been active over the years. He has developed exercise intolerance, fatigue, and shortness of breath over the last several months, especially when in atrial fibrillation. However, even with return to sinus rhythm, he has been more fatigued and he describes mild shortness of breath with walking over 2 blocks. He denies lightheadedness or syncope. No leg swelling, orthopnea, or PND.   The patient is a retired Music therapist. He retired at 83 years old and traveled extensively with his wife. She has developed progressive Alzheimer's dementia and is now in a memory unit at [facility].  The patient has been otherwise healthy with no specific physical limitations, no problems with arthritis, and no major surgeries except for his bypass surgery.     Past Medical History:  Diagnosis Date  . Aortic stenosis 07/06/2013   Echocardiogram on 06/19/13 and showed mild to moderate aortic stenosis.  His ejection fraction is 55-60%.   Marland Kitchen BPH (benign prostatic hyperplasia) 05/30/2011  . CAD (coronary artery disease), native coronary artery 03/07/2017   Cath 2009 with 60% LM and 95% RCA  CABG 6//19/09 with LIMA to LAD, SVG to RCA, SVG to int-OM and SVG to Dx Dr. Tyrone Sage  . ED (erectile dysfunction)   . Gilbert syndrome 12/16/2015  . Gout   . Hyperlipidemia   . Hypertension   . OA (osteoarthritis)   . PAC (premature atrial contraction)   . Persistent atrial fibrillation (HCC) 06/16/2013   CHA2DS2VASC score 4 Cardioversion 03/07/17 with reversion in one week Cardioversion 04/10/17 after loading with amiodarone with sinus     Past Surgical History:  Procedure Laterality Date  . CARDIOVASCULAR STRESS TEST  11/03/04   EF 66%  . CARDIOVERSION N/A 03/07/2017   Procedure: CARDIOVERSION;  Surgeon: [name], MD;  Location: [facility] ENDOSCOPY;  Service: Cardiovascular;  Laterality: N/A;  . CARDIOVERSION N/A 04/10/2017   Procedure: CARDIOVERSION;  Surgeon: [name], MD;  Location: [facility] ENDOSCOPY;  Service: Cardiovascular;  Laterality: N/A;  . COLONOSCOPY  2000  . CORONARY ARTERY BYPASS GRAFT  04/2008  . TONSILLECTOMY AND ADENOIDECTOMY       Inpatient Medications: Scheduled Meds: . [MAR Hold] amiodarone  200 mg Oral Daily  . [MAR Hold] aspirin EC  81 mg Oral Daily  . [facility]  Hold] losartan  50 mg Oral Daily  . [MAR Hold] metoprolol tartrate  25 mg Oral BID  . [MAR Hold] rosuvastatin  5 mg Oral Once per day on Sun Tue Thu Sat  . [MAR Hold] sodium chloride flush  3 mL Intravenous Q12H  . sodium chloride flush  3 mL Intravenous Q12H  . [MAR Hold] tamsulosin  0.4 mg Oral Daily   Continuous  Infusions: . [MAR Hold] sodium chloride    . sodium chloride    . sodium chloride     PRN Meds: [MAR Hold] sodium chloride, sodium chloride, [MAR Hold] acetaminophen, [MAR Hold] diazepam, [MAR Hold] nitroGLYCERIN, [MAR Hold] ondansetron (ZOFRAN) IV, [MAR Hold] sodium chloride flush, sodium chloride flush  Allergies:    Allergies  Allergen Reactions  . Lisinopril Cough    cough     Social History:   Social History   Social History  . Marital status: Married    Spouse name: N/A  . Number of children: N/A  . Years of education: N/A   Occupational History  . Not on file.   Social History Main Topics  . Smoking status: Never Smoker  . Smokeless tobacco: Never Used  . Alcohol use No  . Drug use: No  . Sexual activity: Not on file   Other Topics Concern  . Not on file   Social History Narrative  . No narrative on file    Family History:   The patient's family history includes Heart attack in his brother and father.  ROS:  Please see the history of present illness.  ROS  All other ROS reviewed and negative.     Physical Exam/Data:   Vitals:   05/09/17 1045 05/09/17 1050 05/09/17 1055 05/09/17 1100  BP: (!) 186/82 (!) 174/79 (!) 165/78 (!) 154/81  Pulse: 63 62 63 (!) 56  Resp: 17 18 20 18   Temp:      TempSrc:      SpO2: 98% 94% 95% 95%  Weight:      Height:        Intake/Output Summary (Last 24 hours) at 05/09/17 1111 Last data filed at 05/08/17 1700  Gross per 24 hour  Intake              480 ml  Output                0 ml  Net              480 ml   Filed Weights   05/07/17 1230 05/08/17 0534 05/09/17 0450  Weight: 181 lb 9.6 oz (82.4 kg) 182 lb 12.2 oz (82.9 kg) 178 lb (80.7 kg)   Body mass index is 25.54 kg/m.  General:  Well nourished, well developed, in no acute distress HEENT: normal Lymph: no adenopathy Neck: no JVD Endocrine:  No thryomegaly Vascular: bilateral carotid bruits; FA pulses 2+ bilaterally right groin dressing in place  post-cath  Cardiac:  normal S1, S2; RRR; 3/6 harsh late peaking systolic murmur at the RUSB Lungs:  clear to auscultation bilaterally, no wheezing, rhonchi or rales  Abd: soft, nontender, no hepatomegaly  Ext: no edema Musculoskeletal:  No deformities, BUE and BLE strength normal and equal Skin: warm and dry  Neuro:  CNs 2-12 intact, no focal abnormalities noted Psych:  Normal affect    EKG:  The EKG was personally reviewed and demonstrates normal sinus rhythm with ST/T-wave changes consider anterior ischemia  Relevant CV Studies: Echo: Left ventricle:  The cavity  size was normal. Wall thickness was increased in a pattern of moderate LVH. Systolic function was normal. Wall motion was normal; there were no regional wall motion abnormalities.  ------------------------------------------------------------------- Aortic valve:   Mildly calcified annulus. Trileaflet; moderately thickened, severely calcified leaflets. Cusp separation was severely reduced. Valve mobility was severely restricted.  Doppler:  Transvalvular velocity was increased. There was severe stenosis. There was mild regurgitation.    VTI ratio of LVOT to aortic valve: 0.17. Valve area (VTI): 0.54 cm^2. Indexed valve area (VTI): 0.27 cm^2/m^2. Peak velocity ratio of LVOT to aortic valve: 0.19. Valve area (Vmax): 0.6 cm^2. Indexed valve area (Vmax): 0.29 cm^2/m^2. Mean velocity ratio of LVOT to aortic valve: 0.16. Valve area (Vmean): 0.5 cm^2. Indexed valve area (Vmean): 0.25 cm^2/m^2. Mean gradient (S): 41 mm Hg. Peak gradient (S): 69 mm Hg.  ------------------------------------------------------------------- Aorta:  Aortic root: The aortic root was normal in size.  ------------------------------------------------------------------- Mitral valve:   Structurally normal valve.   Mobility was not restricted.  Doppler:  Transvalvular velocity was within the normal range. There was no evidence for stenosis. There was  mild regurgitation.    Peak gradient (D): 3 mm Hg.  ------------------------------------------------------------------- Left atrium:  The atrium was moderately dilated.  ------------------------------------------------------------------- Right ventricle:  The cavity size was normal. Wall thickness was normal. Systolic function was normal.  ------------------------------------------------------------------- Pulmonic valve:   Not well visualized.  The valve appears to be grossly normal.    Doppler:  Transvalvular velocity was within the normal range. There was no evidence for stenosis.  ------------------------------------------------------------------- Tricuspid valve:   Structurally normal valve.    Doppler: Transvalvular velocity was within the normal range. There was mild regurgitation.  ------------------------------------------------------------------- Pulmonary artery:   The main pulmonary artery was normal-sized. Systolic pressure was mildly increased.  ------------------------------------------------------------------- Right atrium:  The atrium was moderately dilated.  ------------------------------------------------------------------- Pericardium:  There was no pericardial effusion.  ------------------------------------------------------------------- Systemic veins: Inferior vena cava: The vessel was dilated. The respirophasic diameter changes were blunted (< 50%).   Laboratory Data:  Chemistry Recent Labs Lab 05/07/17 1257 05/08/17 0137  NA 139 138  K 3.5 3.8  CL 108 106  CO2 24 24  GLUCOSE 172* 113*  BUN 18 19  CREATININE 1.06 1.17  CALCIUM 8.8* 8.8*  GFRNONAA >60 56*  GFRAA >60 >60  ANIONGAP 7 8     Recent Labs Lab 05/07/17 1257  PROT 6.7  ALBUMIN 3.7  AST 26  ALT 21  ALKPHOS 79  BILITOT 3.1*   Hematology Recent Labs Lab 05/07/17 1257  WBC 5.0  RBC 4.28  HGB 13.0  HCT 40.2  MCV 93.9  MCH 30.4  MCHC 32.3  RDW 14.6  PLT  134*   Cardiac Enzymes Recent Labs Lab 05/07/17 1257 05/07/17 1827 05/08/17 0137  TROPONINI <0.03 <0.03 <0.03   No results for input(s): TROPIPOC in the last 168 hours.  BNPNo results for input(s): BNP, PROBNP in the last 168 hours.  DDimer No results for input(s): DDIMER in the last 168 hours.  Radiology/Studies:  X-ray Chest Pa And Lateral  Result Date: 05/07/2017 CLINICAL DATA:  Chest pain for several days EXAM: CHEST  2 VIEW COMPARISON:  June 03, 2008 FINDINGS: There is a small left pleural effusion. There is no edema or consolidation. Heart is upper normal in size with pulmonary vascularity within normal limits. Patient is status post coronary artery bypass grafting. There is aortic atherosclerosis. No bone lesions. IMPRESSION: Small left pleural effusion. No edema or consolidation. Stable cardiac silhouette. There  is aortic atherosclerosis. Electronically Signed   By: Bretta BangWilliam  Woodruff III M.D.   On: 05/07/2017 13:47   STS Risk Calculator: Procedure: AV Replacement  Risk of Mortality: 4.634%  Morbidity or Mortality: 22.852%  Long Length of Stay: 8.952%  Short Length of Stay: 22.519%  Permanent Stroke: 2.191%  Prolonged Ventilation: 15.146%  DSW Infection: 0.381%  Renal Failure: 6.843%  Reoperation: 8.564%   Assessment and Plan:  This is an 83 year old gentleman with underlying ischemic heart disease and previous CABG presenting with progressive symptoms of severe, stage DI aortic stenosis with NYHA Class II symptoms of chronic diastolic heart failure. I have personally reviewed the patient's recent echo and cardiac catheterization images. His echocardiogram is notable for normal LV systolic function and severe calcific aortic stenosis involving all 3 aortic valve leaflets with peak and mean transvalvular gradients of 69 and 41 mmHg, respectively. The patient's dimensionless index is 0.19 and calculated aortic valve area is 0.6 cm. He does have mild aortic insufficiency. His  cardiac catheterization demonstrates severe native three-vessel coronary disease with moderate left main stenosis, total occlusion of the RCA and LAD, and occlusion of the OM branches. All of his bypass grafts are widely patent with normal perfusion to all areas of myocardium.  I have reviewed the natural history of aortic stenosis with the patient and his daughter who is present today. We have discussed the limitations of medical therapy and the poor prognosis associated with symptomatic aortic stenosis. We have reviewed potential treatment options, including palliative medical therapy, conventional surgical aortic valve replacement, and transcatheter aortic valve replacement. We discussed treatment options in the context of this patient's specific comorbid medical conditions.   The patient's predicted risk of mortality with conventional aortic valve replacement is moderately elevated primarily based on previous cardiac surgery and advanced age. Other significant comorbid conditions include atrial fibrillation. TAVR seems like a reasonable treatment option for this gentleman pending formal cardiac surgical consultation. We discussed typical evaluation which will require a gated cardiac CTA and a CTA of the chest/abdomen/pelvis to evaluate both his cardiac anatomy and peripheral vasculature. Follow-up testing will be arranged as an outpatient.  Will arrange outpatient consultation with Dr Tyrone SageGerhardt who did the patient's bypass surgery.   I reviewed expectations and typical post-operative course with TAVR today.  The patient has been advised of a variety of complications that might develop including but not limited to risks of death, stroke, paravalvular leak, aortic dissection or other major vascular complications, aortic annulus rupture, device embolization, cardiac rupture or perforation, mitral regurgitation, acute myocardial infarction, arrhythmia, heart block or bradycardia requiring permanent pacemaker  placement, congestive heart failure, respiratory failure, renal failure, pneumonia, infection, other late complications related to structural valve deterioration or migration, or other complications that might ultimately cause a temporary or permanent loss of functional independence or other long term morbidity.  Will await further studies and cardiac surgical consultation to determine whether patient has appropriate anatomy to proceed with TAVR.  Enzo BiSigned, Appollonia Klee, MD  05/09/2017 11:11 AM

## 2017-05-09 NOTE — Interval H&P Note (Signed)
History and Physical Interval Note:  05/09/2017 9:42 AM  Thomas Diaz  has presented today for surgery, with the diagnosis of aortic stenosis  The various methods of treatment have been discussed with the patient and family. After consideration of risks, benefits and other options for treatment, the patient has consented to  Procedure(s): Right/Left Heart Cath and Coronary Angiography (N/A) as a surgical intervention .  The patient's history has been reviewed, patient examined, no change in status, stable for surgery.  I have reviewed the patient's chart and labs.  Questions were answered to the patient's satisfaction.     [name]

## 2017-05-09 NOTE — Plan of Care (Signed)
Problem: Physical Regulation: Goal: Ability to maintain clinical measurements within normal limits will improve Outcome: Progressing Pt denies any CP, VSS, Cath consent signed and in chart.

## 2017-05-09 NOTE — H&P (View-Only) (Signed)
Subjective:  His chest discomfort has improved.  He remains in sinus rhythm.  Echocardiogram personally reviewed by me today shows severe aortic stenosis with peak gradient of 69 mm and a mean gradient of 37 mm and severe aortic stenosis.  LV function is normal.  He has mild aortic regurgitation. recent symptoms of near syncope as well as exertional dyspnea despite inversion to sinus rhythm.  He still complains of a cough.    Objective:  Vital Signs in the last 24 hours: BP (!) 163/83 (BP Location: Left Arm)   Pulse 62   Temp 98.2 F (36.8 C) (Oral)   Resp 18   Ht 5\' 10"  (1.778 m)   Wt 82.9 kg (182 lb 12.2 oz)   SpO2 98%   BMI 26.22 kg/m   Physical Exam:  pleasant male in no acute distress  Lungs:  Clear Cardiac:  Regular rhythm, normal S1 and S2, no S3, harsh 2/6 high-pitched murmur BS radiating to carotids  Abdomen:  Soft, nontender, no masses  extremities: Trace edema present  Intake/Output from previous day: 06/19 0701 - 06/20 0700 In: 150 [P.O.:150] Out: -   Weight Filed Weights   05/07/17 1230 05/08/17 0534  Weight: 82.4 kg (181 lb 9.6 oz) 82.9 kg (182 lb 12.2 oz)    Lab Results: Basic Metabolic Panel:  Recent Labs  16/08/9605/19/18 1257 05/08/17 0137  NA 139 138  K 3.5 3.8  CL 108 106  CO2 24 24  GLUCOSE 172* 113*  BUN 18 19  CREATININE 1.06 1.17   CBC:  Recent Labs  05/07/17 1257  WBC 5.0  NEUTROABS 3.5  HGB 13.0  HCT 40.2  MCV 93.9  PLT 134*   Cardiac Enzymes:  Recent Labs  05/07/17 1257 05/07/17 1827 05/08/17 0137  TROPONINI <0.03 <0.03 <0.03    Telemetry: Personally reviewed.  Normal sinus rhythm without recurrence of atrial fibrillation  Assessment/Plan:   1.  Chest discomfort with some atypical features with negative enzymes-MI ruled out 2.  CAD with previous bypass grafting 3.  Severe aortic stenosis by echo 4.  Hypertension 5.  Paroxysmal atrial fibrillation currently in sinus rhythm following repeat cardioversion on May  23  Recommendations:  Discussion with patient and daughter as well as Dr. Tonny BollmanMichael Cooper.  Dr. Excell Seltzerooper will see the patient in consultation tomorrow and have recommended that he have a cardiac catheterization to assess his grafts as well as his hemodynamics in anticipation of TAVR.  I believe the patient is symptomatic with severe aortic stenosis and would benefit from this. Cardiac catheterization was discussed with the patient fully including risks of myocardial infarction, death, stroke, bleeding, arrhythmia, dye allergy, renal insufficiency or bleeding.  The patient understands and is willing to proceed.  Following the catheterization and Dr. Earmon Phoenixooper's consultation he could probably be discharged home to continue his other evaluation as an outpatient.  I will hold his Xarelto tonight.  It will have been 28 days since his cardioversion and I think we probably don't need to cover him with heparin.  Darden PalmerW. Spencer Tilley, Jr.  MD [facility] CampusFACC Cardiology  05/08/2017, 2:02 PM

## 2017-05-09 NOTE — Progress Notes (Signed)
Site area: Right groin a 6 french arterial and 7 french venous sheath was removed  Site Prior to Removal:  Level 0  Pressure Applied For 15 MINUTES    Bedrest  Beginning at 1115am  Manual:   Yes.    Patient Status During Pull:  stable  Post Pull Groin Site:  Level 0  Post Pull Instructions Given:  Yes.    Post Pull Pulses Present:  Yes.    Dressing Applied:  Yes.    Comments:  VS remain stable

## 2017-05-09 NOTE — Discharge Summary (Signed)
Physician Discharge Summary  Patient ID: Thomas Diaz MRN: [id number] DOB/AGE: February 19, 1932 83 y.o.  Admit date: 05/07/2017 Discharge date: 05/09/2017  Primary Physician:  Bill Salinas, PA [place]  Primary Discharge Diagnosis:  1.  Severe aortic stenosis which is symptomatic  Secondary Discharge Diagnosis: 2.  Chest pain of undetermined etiology with myocardial infarction ruled out 3.  Coronary artery disease with previous bypass grafting with patent grafts 4.  Hyperlipidemia under treatment 5.  Pulmonary hypertension 6.  Aortic atherosclerosis 7.  Hypertensive heart disease 8.  Paroxysmal atrial fibrillation currently in sinus rhythm 9.  Long-term use of anticoagulation with Xarelto  Procedures:  Cardiac catheterization, echocardiogram  Consults:  Structural heart disease Dr. Lynnae January Course: This very nice 83 year old male has a history of coronary artery disease with bypass grafting in 2009.  He also had previous aortic stenosis.  He switched care to me in December of this year.  He had had recurrence of atrial fibrillation and had been on Xarelto but developed persistent atrial fibrillation around the first part of the year associated with markedly reduced exercise capacity.  He did not feel well and attempts were made for cardioversion.  An echocardiogram done the first part of the year showed at least moderate aortic stenosis and could be more severe.  He had cardioversion done on April 23 but reverted back to sinus rhythm in one week.  After that the decision was made to try to convert him to sinus rhythm again following antiarrhythmic drug loading and after discussion of risks of amiodarone versus Tikosyn opted to go with amiodarone.  After a time of amiodarone loading he underwent cardioversion on May 23 and has maintained sinus rhythm since then.  He noted marked improvement in his ability to exercise and do normal working.  He did have sinus nasal  drainage and difficulty with cough and saw about a week ago his PA who placed him on Augmentin as well as Zyrtec.  His voice has not been strong and he has been weaker.  Last evening after going to bed he developed upper left anterior chest discomfort which was a new finding for him.  It persisted through the evening and lasted around 6 hours but was gone this morning.  He scheduled an appointment to see me in the office and when seen in the office appear to be much weaker than he had been and because of the prolonged chest pain is brought in to the hospital for evaluation.  He had noticed inability to go back and do activities but still did not have the strength and stamina that he had previous.   He has been on Xarelto because of atrial fibrillation and EKG in the office showed him to be in sinus rhythm.  He has not been having exertional chest pain and did note improvement in his exercise tolerance and duration following the maintenance of sinus rhythm.  His wife is severely demented and is in a separate living facility.  His daughter was concerned about him as he was talking about making financial arrangements and he has been overall concerned about his health and has been somewhat weaker.  No severe shortness of breath.  The patient was brought into the hospital and had serial troponins which were normal.  He took his own Xarelto the night of admission and when seen the next morning had an echocardiogram that showed mild to moderate pulmonary hypertension as well as severe aortic stenosis with a peak gradient of  69 and a mean gradient of 41.  Aortic valve area was 0.6 cm.  Discussion was had with the patient and there was concern that his symptoms were due to either aortic stenosis or possible graft disease.  He was amenable to catheterization and this was performed the day of discharge that showed moderate pulmonary hypertension, severe aortic stenosis, severe three-vessel coronary artery disease but with  patent grafts to all of his arteries.  He was seen in consultation by Dr. Tonny BollmanMichael Cooper who felt that he had severe symptomatic aortic stenosis and could be a candidate for Her.  The patient was interested in proceeding with this and he would like to have further evaluation for this as an outpatient.  Dr. Excell Seltzerooper felt that he could be discharged and that we could resume the workup as an outpatient.  His catheterization site was evaluated and was clean and dry.  He had no recurrence of the chest pain and is discharged home in improved condition with instructions for catheterization care.  Dr. Excell Seltzerooper will make arrangements for him to have a CT scan of his chest as well as his legs as well as surgical consultation with Dr. Tyrone SageGerhardt and other cardiovascular surgeons.  Discharge Exam: Blood pressure (!) 154/83, pulse 65, temperature 98.4 F (36.9 C), temperature source Oral, resp. rate 17, height 5\' 10"  (1.778 m), weight 80.7 kg (178 lb), SpO2 100 %. Weight: 80.7 kg (178 lb) Catheterization site is currently clean and dry.  2/6 murmur of aortic stenosis Labs: CBC:   Lab Results  Component Value Date   WBC 5.0 05/07/2017   HGB 13.0 05/07/2017   HCT 40.2 05/07/2017   MCV 93.9 05/07/2017   PLT 134 (L) 05/07/2017    CMP:  Recent Labs Lab 05/07/17 1257 05/08/17 0137  NA 139 138  K 3.5 3.8  CL 108 106  CO2 24 24  BUN 18 19  CREATININE 1.06 1.17  CALCIUM 8.8* 8.8*  PROT 6.7  --   BILITOT 3.1*  --   ALKPHOS 79  --   ALT 21  --   AST 26  --   GLUCOSE 172* 113*    Lipid Panel     Component Value Date/Time   CHOL 141 04/13/2015 0947   TRIG 73.0 04/13/2015 0947   HDL 55.00 04/13/2015 0947   CHOLHDL 3 04/13/2015 0947   VLDL 14.6 04/13/2015 0947   LDLCALC 71 04/13/2015 0947    Cardiac Enzymes:  Recent Labs  05/07/17 1257 05/07/17 1827 05/08/17 0137  TROPONINI <0.03 <0.03 <0.03    Thyroid: Lab Results  Component Value Date   TSH 1.18 04/13/2015   T4TOTAL 7.1 05/06/2008     Hemoglobin A1C: Lab Results  Component Value Date   HGBA1C  05/06/2008    5.8 (NOTE)   The ADA recommends the following therapeutic goals for glycemic   control related to Hgb A1C measurement:   Goal of Therapy:   < 7.0% Hgb A1C   Action Suggested:  > 8.0% Hgb A1C   Ref:  Diabetes Care, 22, Suppl. 1, 1999    Radiology: Aortic atherosclerosis, previous bypass grafting, very small left pleural effusion  EKG: Personally reviewed.  Normal sinus rhythm, T-wave inversions anteriorly  Discharge Medications: Allergies as of 05/09/2017      Reactions   Lisinopril Cough   cough      Medication List    STOP taking these medications   amoxicillin-clavulanate 875-125 MG tablet Commonly known as:  AUGMENTIN  TAKE these medications   amiodarone 200 MG tablet Commonly known as:  PACERONE Take 200 mg by mouth daily.   diazepam 5 MG tablet Commonly known as:  VALIUM Take 5 mg by mouth at bedtime as needed (sleep).   losartan 50 MG tablet Commonly known as:  COZAAR Take 1 tablet (50 mg total) by mouth daily.   metoprolol tartrate 50 MG tablet Commonly known as:  LOPRESSOR Take 0.5 tablets (25 mg total) by mouth 2 (two) times daily.   rivaroxaban 20 MG Tabs tablet Commonly known as:  XARELTO Take 1 tablet (20 mg total) by mouth daily with supper.   rosuvastatin 5 MG tablet Commonly known as:  CRESTOR Take 1 tablet (5 mg total) by mouth 4 (four) times a week. FOUR times a week What changed:  when to take this  additional instructions   silodosin 8 MG Caps capsule Commonly known as:  RAPAFLO Take 8 mg by mouth as needed (difficulty urination). As needed       Followup plans and appointments: He is to follow-up with me in one week.  He is to have outpatient testing for suitability for TAVR to be arranged by Dr. Excell Seltzer and is also to have an appointment scheduled with Dr. Tyrone Sage.  Time spent with patient to include physician time:  45 minutes   Signed: W.  Ashley Royalty. MD [employer] 05/09/2017, 5:14 PM

## 2017-05-10 ENCOUNTER — Other Ambulatory Visit: Payer: Self-pay | Admitting: *Deleted

## 2017-05-10 DIAGNOSIS — R079 Chest pain, unspecified: Secondary | ICD-10-CM

## 2017-05-10 DIAGNOSIS — I350 Nonrheumatic aortic (valve) stenosis: Secondary | ICD-10-CM

## 2017-05-21 ENCOUNTER — Ambulatory Visit (HOSPITAL_COMMUNITY)
Admission: RE | Admit: 2017-05-21 | Discharge: 2017-05-21 | Disposition: A | Discharge status: Home / Self Care | Payer: Medicare Other | Source: Ambulatory Visit | Attending: Cardiovascular Disease | Admitting: Cardiovascular Disease

## 2017-05-21 ENCOUNTER — Ambulatory Visit (HOSPITAL_BASED_OUTPATIENT_CLINIC_OR_DEPARTMENT_OTHER)
Admission: RE | Admit: 2017-05-21 | Discharge: 2017-05-21 | Disposition: A | Discharge status: Home / Self Care | Payer: Medicare Other | Source: Ambulatory Visit | Attending: Cardiovascular Disease | Admitting: Cardiovascular Disease

## 2017-05-21 ENCOUNTER — Ambulatory Visit: Payer: Medicare Other | Attending: Cardiovascular Disease | Admitting: Physical Therapy

## 2017-05-21 ENCOUNTER — Encounter (HOSPITAL_COMMUNITY): Payer: Self-pay

## 2017-05-21 ENCOUNTER — Encounter: Payer: Self-pay | Admitting: Physical Therapy

## 2017-05-21 DIAGNOSIS — I2510 Atherosclerotic heart disease of native coronary artery without angina pectoris: Secondary | ICD-10-CM | POA: Insufficient documentation

## 2017-05-21 DIAGNOSIS — Z951 Presence of aortocoronary bypass graft: Secondary | ICD-10-CM | POA: Insufficient documentation

## 2017-05-21 DIAGNOSIS — R2689 Other abnormalities of gait and mobility: Secondary | ICD-10-CM | POA: Diagnosis present

## 2017-05-21 DIAGNOSIS — I350 Nonrheumatic aortic (valve) stenosis: Secondary | ICD-10-CM

## 2017-05-21 DIAGNOSIS — K5730 Diverticulosis of large intestine without perforation or abscess without bleeding: Secondary | ICD-10-CM | POA: Diagnosis not present

## 2017-05-21 DIAGNOSIS — I6523 Occlusion and stenosis of bilateral carotid arteries: Secondary | ICD-10-CM | POA: Insufficient documentation

## 2017-05-21 DIAGNOSIS — I700 Atherosclerosis of aorta: Secondary | ICD-10-CM | POA: Diagnosis not present

## 2017-05-21 DIAGNOSIS — K8020 Calculus of gallbladder without cholecystitis without obstruction: Secondary | ICD-10-CM | POA: Diagnosis not present

## 2017-05-21 LAB — PULMONARY FUNCTION TEST
DL/VA % PRED: 77 %
DL/VA: 3.53 ml/min/mmHg/L
DLCO unc % pred: 45 %
DLCO unc: 14.76 ml/min/mmHg
FEF 25-75 POST: 2.98 L/s
FEF 25-75 Pre: 2.37 L/sec
FEF2575-%Change-Post: 25 %
FEF2575-%Pred-Post: 164 %
FEF2575-%Pred-Pre: 130 %
FEV1-%Change-Post: 3 %
FEV1-%PRED-PRE: 80 %
FEV1-%Pred-Post: 84 %
FEV1-Post: 2.29 L
FEV1-Pre: 2.21 L
FEV1FVC-%Change-Post: 0 %
FEV1FVC-%PRED-PRE: 115 %
FEV6-%Change-Post: 3 %
FEV6-%Pred-Post: 76 %
FEV6-%Pred-Pre: 74 %
FEV6-Post: 2.77 L
FEV6-Pre: 2.69 L
FEV6FVC-%Pred-Post: 107 %
FEV6FVC-%Pred-Pre: 107 %
FVC-%Change-Post: 3 %
FVC-%PRED-POST: 71 %
FVC-%PRED-PRE: 69 %
FVC-POST: 2.77 L
FVC-PRE: 2.69 L
PRE FEV1/FVC RATIO: 82 %
Post FEV1/FVC ratio: 83 %
Post FEV6/FVC ratio: 100 %
Pre FEV6/FVC Ratio: 100 %
RV % pred: 81 %
RV: 2.21 L
TLC % pred: 73 %
TLC: 5.17 L

## 2017-05-21 LAB — VAS US CAROTID
LCCADDIAS: -10 cm/s
LEFT ECA DIAS: -8 cm/s
LEFT VERTEBRAL DIAS: 7 cm/s
LICADSYS: -54 cm/s
LICAPDIAS: -7 cm/s
Left CCA dist sys: -54 cm/s
Left CCA prox dias: 11 cm/s
Left CCA prox sys: 70 cm/s
Left ICA dist dias: -12 cm/s
Left ICA prox sys: -39 cm/s
RCCADSYS: -56 cm/s
RIGHT ECA DIAS: -4 cm/s
RIGHT VERTEBRAL DIAS: 9 cm/s
Right CCA prox dias: 6 cm/s
Right CCA prox sys: 47 cm/s

## 2017-05-21 MED ORDER — IOPAMIDOL (ISOVUE-370) INJECTION 76%
INTRAVENOUS | Status: AC
  Administered 2017-05-21: 50 mL
  Filled 2017-05-21: qty 50

## 2017-05-21 MED ORDER — ALBUTEROL SULFATE (2.5 MG/3ML) 0.083% IN NEBU
2.5000 mg | INHALATION_SOLUTION | Freq: Once | RESPIRATORY_TRACT | Status: AC
  Administered 2017-05-21: 2.5 mg via RESPIRATORY_TRACT

## 2017-05-21 MED ORDER — IOPAMIDOL (ISOVUE-370) INJECTION 76%
INTRAVENOUS | Status: AC
  Administered 2017-05-21: 100 mL
  Filled 2017-05-21: qty 100

## 2017-05-21 NOTE — Progress Notes (Signed)
*  PRELIMINARY RESULTS* Vascular Ultrasound Carotid Duplex (Doppler) has been completed.  Findings suggest 1-39% internal carotid artery stenosis bilaterally. Vertebral arteries are patent with antegrade flow.  05/21/2017 2:33 PM Gertie FeyMichelle Mikal Wisman, BS, RVT, RDCS, RDMS
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
## 2017-05-24 ENCOUNTER — Institutional Professional Consult (permissible substitution) (INDEPENDENT_AMBULATORY_CARE_PROVIDER_SITE_OTHER): Payer: Medicare Other | Admitting: Cardiothoracic Surgery

## 2017-05-24 ENCOUNTER — Other Ambulatory Visit: Payer: Self-pay | Admitting: *Deleted

## 2017-05-24 ENCOUNTER — Encounter: Payer: Self-pay | Admitting: Cardiothoracic Surgery

## 2017-05-24 VITALS — BP 133/74 | HR 67 | Resp 20 | Ht 70.0 in | Wt 173.0 lb

## 2017-05-24 DIAGNOSIS — I350 Nonrheumatic aortic (valve) stenosis: Secondary | ICD-10-CM | POA: Diagnosis not present

## 2017-05-24 NOTE — Progress Notes (Signed)
[address]       [name] [address]             [phone]                    EMERICK WEATHERLY [facility] Medical Record #[id number] Date of Birth: December 19, 1931  Referring: [name], MD Primary Care: [name], MD  Chief Complaint:   No chief complaint on file.   History of Present Illness:    Thomas Diaz 83 y.o. male is seen in the office  today for severe aortic stenosis with a peak velocity across the aortic valve of 416 cm/s. Patient underwent multivessel coronary artery bypass grafting by me in 2009 . The patient had done well since his CABG until he developed atrial fibrillation several months ago with markedly reduced exercise tolerance. He underwent cardioversion and ultimately required amiodarone with repeated cardioversion and is now maintaining sinus rhythm. With atrial fib he became much more symptomatic, with increasing fluid retention, pedal edema, shortness of breath with minimal exertion. Echo confirmed critical aortic stenosis  Aortic valve peak velocity, S            416   cm/s     ----------  Aortic valve mean velocity, S            286   cm/s     ----------  The patient is a retired Music therapist. He retired at 83 years old and traveled extensively with his wife. She has developed progressive Alzheimer's dementia and is now in a memory unit at [facility]. The patient has been otherwise healthy with no specific physical limitations, no problems with arthritis, and no major surgeries except for his bypass surgery.  Current Activity/ Functional Status:  Patient is independent with mobility/ambulation, transfers, ADL's, IADL's.   Zubrod Score: At the time of surgery this patient's most appropriate activity status/level should be described as: []     0    Normal activity, no symptoms []     1    Restricted in physical strenuous activity but ambulatory, able to do out light work []     2    Ambulatory and capable of self  care, unable to do work activities, up and about               >50 % of waking hours                              []     3    Only limited self care, in bed greater than 50% of waking hours []     4    Completely disabled, no self care, confined to bed or chair []     5    Moribund   Past Medical History:  Diagnosis Date  . Aortic stenosis 07/06/2013   Echocardiogram on 06/19/13 and showed mild to moderate aortic stenosis.  His ejection fraction is 55-60%.   Marland Kitchen BPH (benign prostatic hyperplasia) 05/30/2011  . CAD (coronary artery disease), native coronary artery 03/07/2017   Cath 2009 with 60% LM and 95% RCA  CABG 6//19/09 with LIMA to LAD, SVG to RCA, SVG to int-OM and SVG to Dx Dr. Tyrone Sage  . ED (erectile dysfunction)   . Gilbert syndrome 12/16/2015  . Gout   . Hyperlipidemia   . Hypertension   . OA (osteoarthritis)   . PAC (premature atrial  contraction)   . Persistent atrial fibrillation (HCC) 06/16/2013   CHA2DS2VASC score 4 Cardioversion 03/07/17 with reversion in one week Cardioversion 04/10/17 after loading with amiodarone with sinus     Past Surgical History:  Procedure Laterality Date  . CARDIOVASCULAR STRESS TEST  11/03/04   EF 66%  . CARDIOVERSION N/A 03/07/2017   Procedure: CARDIOVERSION;  Surgeon: [name], MD;  Location[facility] ENDOSCOPY;  Service: Cardiovascular;  Laterality: N/A;  . CARDIOVERSION N/A 04/10/2017   Procedure: CARDIOVERSION;  Surgeon: [name], MD;  Location: [facility] ENDOSCOPY;  Service: Cardiovascular;  Laterality: N/A;  . COLONOSCOPY  2000  . CORONARY ARTERY BYPASS GRAFT  04/2008  . RIGHT/LEFT HEART CATH AND CORONARY/GRAFT ANGIOGRAPHY N/A 05/09/2017   Procedure: Right/Left Heart Cath and Coronary/Graft Angiography;  Surgeon: Lennette Bihari, MD;  Location: [facility] INVASIVE CV LAB;  Service: Cardiovascular;  Laterality: N/A;  . TONSILLECTOMY AND ADENOIDECTOMY      Family History  Problem Relation Age of Onset  . Heart attack Father   . Heart attack Brother      Social History   Social History  . Marital status: Married    Spouse name: N/A  . Number of children: N/A  . Years of education: N/A   Occupational History  . Not on file.   Social History Main Topics  . Smoking status: Never Smoker  . Smokeless tobacco: Never Used  . Alcohol use No  . Drug use: No  . Sexual activity: Not on file   Other Topics Concern  . Not on file   Social History Narrative  . No narrative on file    History  Smoking Status  . Never Smoker  Smokeless Tobacco  . Never Used    History  Alcohol Use No     Allergies  Allergen Reactions  . Lisinopril Cough    cough     Current Outpatient Prescriptions  Medication Sig Dispense Refill  . amiodarone (PACERONE) 200 MG tablet Take 200 mg by mouth daily.    . diazepam (VALIUM) 5 MG tablet Take 5 mg by mouth at bedtime as needed (sleep).    . losartan (COZAAR) 50 MG tablet Take 1 tablet (50 mg total) by mouth daily. 90 tablet 1  . metoprolol tartrate (LOPRESSOR) 50 MG tablet Take 0.5 tablets (25 mg total) by mouth 2 (two) times daily.    . Rivaroxaban (XARELTO) 20 MG TABS tablet Take 1 tablet (20 mg total) by mouth daily with supper. 30 tablet 0  . rosuvastatin (CRESTOR) 5 MG tablet Take 1 tablet (5 mg total) by mouth 4 (four) times a week. FOUR times a week (Patient taking differently: Take 5 mg by mouth 3 (three) times a week. Mon / Wed / Fri) 48 tablet 0  . silodosin (RAPAFLO) 8 MG CAPS capsule Take 8 mg by mouth as needed (difficulty urination). As needed     No current facility-administered medications for this visit.     Pertinent items are noted in HPI.   Review of Systems:     Cardiac Review of Systems: Y or N  Chest Pain [    ]  Resting SOB [   ] Exertional SOB  [  ]  Orthopnea [  ]   Pedal Edema [   ]    Palpitations [  ] Syncope  [  ]   Presyncope [   ]  General Review of Systems: [Y] = yes [  ]=no Constitional: recent weight change [  ];  Wt loss over the last 3 months [   ]  anorexia [  ]; fatigue [  ]; nausea [  ]; night sweats [  ]; fever [  ]; or chills [  ];          Dental: poor dentition[  ]; Last Dentist visit:   Eye : blurred vision [  ]; diplopia [   ]; vision changes [  ];  Amaurosis fugax[  ]; Resp: cough [  ];  wheezing[  ];  hemoptysis[  ]; shortness of breath[  ]; paroxysmal nocturnal dyspnea[  ]; dyspnea on exertion[  ]; or orthopnea[  ];  GI:  gallstones[  ], vomiting[  ];  dysphagia[  ]; melena[  ];  hematochezia [  ]; heartburn[  ];   Hx of  Colonoscopy[  ]; GU: kidney stones [  ]; hematuria[  ];   dysuria [  ];  nocturia[  ];  history of     obstruction [  ]; urinary frequency [  ]             Skin: rash, swelling[  ];, hair loss[  ];  peripheral edema[  ];  or itching[  ]; Musculosketetal: myalgias[  ];  joint swelling[  ];  joint erythema[  ];  joint pain[  ];  back pain[  ];  Heme/Lymph: bruising[  ];  bleeding[  ];  anemia[  ];  Neuro: TIA[  ];  headaches[  ];  stroke[  ];  vertigo[  ];  seizures[  ];   paresthesias[  ];  difficulty walking[  ];  Psych:depression[  ]; anxiety[  ];  Endocrine: diabetes[  ];  thyroid dysfunction[  ];  Immunizations: Flu up to date [  ]; Pneumococcal up to date [  ];  Other:  Physical Exam: There were no vitals taken for this visit.  PHYSICAL EXAMINATION: General appearance: alert, cooperative, appears stated age and no distress Head: Normocephalic, without obvious abnormality, atraumatic Neck: no adenopathy, no carotid bruit, no JVD, supple, symmetrical, trachea midline and thyroid not enlarged, symmetric, no tenderness/mass/nodules Lymph nodes: Cervical, supraclavicular, and axillary nodes normal. Resp: clear to auscultation bilaterally Back: symmetric, no curvature. ROM normal. No CVA tenderness. Cardio: systolic murmur: early systolic 3/6, squeaking at lower left sternal border GI: soft, non-tender; bowel sounds normal; no masses,  no organomegaly Extremities: extremities normal, atraumatic, no cyanosis  or edema and Homans sign is negative, no sign of DVT Neurologic: Grossly normal  Diagnostic Studies & Laboratory data:     Recent Radiology Findings:   X-ray Chest Pa And Lateral  Result Date: 05/07/2017 CLINICAL DATA:  Chest pain for several days EXAM: CHEST  2 VIEW COMPARISON:  June 03, 2008 FINDINGS: There is a small left pleural effusion. There is no edema or consolidation. Heart is upper normal in size with pulmonary vascularity within normal limits. Patient is status post coronary artery bypass grafting. There is aortic atherosclerosis. No bone lesions. IMPRESSION: Small left pleural effusion. No edema or consolidation. Stable cardiac silhouette. There is aortic atherosclerosis. Electronically Signed   By: Bretta Bang III M.D.   On: 05/07/2017 13:47   Ct Coronary Morph W/cta Cor W/score W/ca W/cm &/or Wo/cm  Addendum Date: 05/21/2017   ADDENDUM REPORT: 05/21/2017 16:53 ADDENDUM: Please see separate dictation for contemporaneously obtained CTA of the chest, abdomen and pelvis dated 05/21/2017 for full description of relevant extracardiac findings. Electronically Signed   By: Trudie Reed M.D.   On: 05/21/2017  16:53   Result Date: 05/21/2017 CLINICAL DATA:  Aortic Stenosis EXAM: Cardiac TAVR CT TECHNIQUE: The patient was scanned on a Siemens 128 slice scanner. A 120 kV retrospective scan was triggered in the ascending thoracic aorta at 120 HU's. Gantry rotation speed was 300 msecs and collimation was .9 mm. No beta blockade or nitro were given. The 3D data set was reconstructed in 5% intervals of the R-R cycle. Systolic and diastolic phases were analyzed on a dedicated work station using MPR, MIP and VRT modes. The patient received 80 cc of contrast. FINDINGS: Aortic Valve:  Tri leaflet with severely calcified leaflets Aorta: Normal origin great vessels No aneurysm 3 patent SVG;s and a patent LIMA are noted Sino-tubular Junction:  26.5 mm Ascending Thoracic Aorta:  33 mm Aortic Arch:  26 mm  Descending Thoracic Aorta:  25 mm Sinus of Valsalva Measurements: Non-coronary:  30.5 mm Right - coronary:  30 mm Left -   coronary:  30.8 mm Coronary Artery Height above Annulus: Left Main:  12.7 mm above annulus with patent LIMA Right Coronary:  13.8 mm above annulus with patent SVG;s Virtual Basal Annulus Measurements: Maximum / Minimum Diameter:  26.8 mm x 20.6 mm Perimeter:  76.7 mm Area:  442 mm2 Coronary Arteries: Sufficient height above annulus for deployment with patent bypass grafts Optimum Fluoroscopic Angle for Delivery: LAO 15 degrees Cranial 2 degrees IMPRESSION: 1) Severely calcified tri leaflet aortic valve with annulus 442 mm2 suitable for a 26 mm Sapien 3 valve 2) Optimum angiographic angle for deployment LAO 15 degrees Cranial 2 degrees 3) Normal aortic root 4) Coronary arteries sufficient height above annulus for deployment with patent bypass grafts 5) Moderate bi atrial enlargement. Small filling defect in most distal tip of LAA that clears with cardiac cycle likely pectinate muscle Charlton Haws Electronically Signed: By: Charlton Haws M.D. On: 05/21/2017 16:23   Ct Angio Chest Aorta W/cm &/or Wo/cm  Result Date: 05/21/2017 CLINICAL DATA:  83 year old male with history of severe aortic stenosis. Preprocedural study prior to potential transcatheter aortic valve replacement (TAVR). EXAM: CT ANGIOGRAPHY CHEST, ABDOMEN AND PELVIS TECHNIQUE: Multidetector CT imaging through the chest, abdomen and pelvis was performed using the standard protocol during bolus administration of intravenous contrast. Multiplanar reconstructed images and MIPs were obtained and reviewed to evaluate the vascular anatomy. CONTRAST:  50 mL of Isovue 370. COMPARISON:  Chest CT 12/17/2003. FINDINGS: CTA CHEST FINDINGS Cardiovascular: Heart size is mildly enlarged with left atrial dilatation. There is no significant pericardial fluid, thickening or pericardial calcification. There is aortic atherosclerosis, as well as  atherosclerosis of the great vessels of the mediastinum and the coronary arteries, including calcified atherosclerotic plaque in the left main, left anterior descending, left circumflex and right coronary arteries. Status post median sternotomy for CABG, including LIMA to the LAD. Severe thickening calcification of the aortic valve. Mediastinum/Lymph Nodes: No pathologically enlarged mediastinal or hilar lymph nodes. Densely calcified right hilar lymph node incidentally noted. Esophagus is unremarkable in appearance. No axillary lymphadenopathy. Lungs/Pleura: Calcified granuloma in the right upper lobe. No other suspicious appearing pulmonary nodules or masses are noted. No acute consolidative airspace disease. No pleural effusions. Musculoskeletal/Soft Tissues: Median sternotomy wires. There are no aggressive appearing lytic or blastic lesions noted in the visualized portions of the skeleton. CTA ABDOMEN AND PELVIS FINDINGS Hepatobiliary: No suspicious cystic or solid hepatic lesions. No intra or extrahepatic biliary ductal dilatation. Small calcified gallstones lying dependently in the gallbladder. No findings to suggest an acute cholecystitis at this time.  Pancreas: No pancreatic mass. No pancreatic ductal dilatation. No pancreatic or peripancreatic fluid or inflammatory changes. Spleen: Tiny calcified granulomas in the spleen. Otherwise, unremarkable. Adrenals/Urinary Tract: Bilateral kidneys and bilateral adrenal glands are normal in appearance. There is no hydroureteronephrosis or perinephric stranding to suggest urinary tract obstruction at this time. Tiny diverticulae extending off the anterior aspect of the urinary bladder incidentally noted. Stomach/Bowel: The appearance of the stomach is normal. There is no pathologic dilatation of small bowel or colon. Extensive colonic diverticulosis is noted, particularly in the descending colon and sigmoid colon, without surrounding inflammatory changes to suggest an  acute diverticulitis at this time. Normal appendix. Vascular/Lymphatic: Aortic atherosclerosis, with vascular findings and measurements pertinent to potential TAVR procedure, as detailed below. No aneurysm or dissection noted in the abdominal or pelvic vasculature. Celiac axis, superior mesenteric artery and inferior mesenteric artery are all widely patent without hemodynamically significant stenosis. Two right-sided renal arteries and 1 left renal artery are all widely patent without hemodynamically significant stenosis. There are numerous prominent but nonenlarged retroperitoneal lymph nodes incidentally noted (nonspecific). No other pathologically enlarged abdominal or pelvic lymph nodes are identified. Reproductive: Median lobe hypertrophy of the prostate gland. Seminal vesicles are unremarkable in appearance. Other: No significant volume of ascites.  No pneumoperitoneum. Musculoskeletal: There are no aggressive appearing lytic or blastic lesions noted in the visualized portions of the skeleton. VASCULAR MEASUREMENTS PERTINENT TO TAVR: AORTA: Minimal Aortic Diameter -  16 x 16 mm Severity of Aortic Calcification -  mild RIGHT PELVIS: Right Common Iliac Artery - Minimal Diameter - 9.9 x 10.3 mm Tortuosity - mild Calcification - mild Right External Iliac Artery - Minimal Diameter - 8.8 x 8.4 mm Tortuosity - severe Calcification - none Right Common Femoral Artery - Minimal Diameter - 8.6 x 9.4 mm Tortuosity - mild Calcification - mild LEFT PELVIS: Left Common Iliac Artery - Minimal Diameter - 10.9 x 10.3 mm Tortuosity - mild Calcification - mild Left External Iliac Artery - Minimal Diameter - 8.4 x 7.5 mm Tortuosity - severe Calcification - none Left Common Femoral Artery - Minimal Diameter - 8.8 x 8.5 mm Tortuosity - mild Calcification - none Review of the MIP images confirms the above findings. IMPRESSION: 1. Vascular findings and measurements pertinent to potential TAVR procedure, as detailed above. This patient  has suitable pelvic arterial access bilaterally. 2. Severe thickening calcification of the aortic valve, compatible with the reported clinical history of severe aortic stenosis. 3. Cardiomegaly with left atrial dilatation. 4. Aortic atherosclerosis, in addition to left main and 3 vessel coronary artery disease. Status post median sternotomy for CABG including LIMA to the LAD. 5. Cholelithiasis without evidence of acute cholecystitis at this time. 6. Colonic diverticulosis without evidence of acute diverticulitis at this time. 7. Additional incidental findings, as above. Electronically Signed   By: Trudie Reed M.D.   On: 05/21/2017 16:53   Ct Angio Abd/pel W/ And/or W/o  Result Date: 05/21/2017 CLINICAL DATA:  83 year old male with history of severe aortic stenosis. Preprocedural study prior to potential transcatheter aortic valve replacement (TAVR). EXAM: CT ANGIOGRAPHY CHEST, ABDOMEN AND PELVIS TECHNIQUE: Multidetector CT imaging through the chest, abdomen and pelvis was performed using the standard protocol during bolus administration of intravenous contrast. Multiplanar reconstructed images and MIPs were obtained and reviewed to evaluate the vascular anatomy. CONTRAST:  50 mL of Isovue 370. COMPARISON:  Chest CT 12/17/2003. FINDINGS: CTA CHEST FINDINGS Cardiovascular: Heart size is mildly enlarged with left atrial dilatation. There is no significant pericardial fluid,  thickening or pericardial calcification. There is aortic atherosclerosis, as well as atherosclerosis of the great vessels of the mediastinum and the coronary arteries, including calcified atherosclerotic plaque in the left main, left anterior descending, left circumflex and right coronary arteries. Status post median sternotomy for CABG, including LIMA to the LAD. Severe thickening calcification of the aortic valve. Mediastinum/Lymph Nodes: No pathologically enlarged mediastinal or hilar lymph nodes. Densely calcified right hilar lymph node  incidentally noted. Esophagus is unremarkable in appearance. No axillary lymphadenopathy. Lungs/Pleura: Calcified granuloma in the right upper lobe. No other suspicious appearing pulmonary nodules or masses are noted. No acute consolidative airspace disease. No pleural effusions. Musculoskeletal/Soft Tissues: Median sternotomy wires. There are no aggressive appearing lytic or blastic lesions noted in the visualized portions of the skeleton. CTA ABDOMEN AND PELVIS FINDINGS Hepatobiliary: No suspicious cystic or solid hepatic lesions. No intra or extrahepatic biliary ductal dilatation. Small calcified gallstones lying dependently in the gallbladder. No findings to suggest an acute cholecystitis at this time. Pancreas: No pancreatic mass. No pancreatic ductal dilatation. No pancreatic or peripancreatic fluid or inflammatory changes. Spleen: Tiny calcified granulomas in the spleen. Otherwise, unremarkable. Adrenals/Urinary Tract: Bilateral kidneys and bilateral adrenal glands are normal in appearance. There is no hydroureteronephrosis or perinephric stranding to suggest urinary tract obstruction at this time. Tiny diverticulae extending off the anterior aspect of the urinary bladder incidentally noted. Stomach/Bowel: The appearance of the stomach is normal. There is no pathologic dilatation of small bowel or colon. Extensive colonic diverticulosis is noted, particularly in the descending colon and sigmoid colon, without surrounding inflammatory changes to suggest an acute diverticulitis at this time. Normal appendix. Vascular/Lymphatic: Aortic atherosclerosis, with vascular findings and measurements pertinent to potential TAVR procedure, as detailed below. No aneurysm or dissection noted in the abdominal or pelvic vasculature. Celiac axis, superior mesenteric artery and inferior mesenteric artery are all widely patent without hemodynamically significant stenosis. Two right-sided renal arteries and 1 left renal artery  are all widely patent without hemodynamically significant stenosis. There are numerous prominent but nonenlarged retroperitoneal lymph nodes incidentally noted (nonspecific). No other pathologically enlarged abdominal or pelvic lymph nodes are identified. Reproductive: Median lobe hypertrophy of the prostate gland. Seminal vesicles are unremarkable in appearance. Other: No significant volume of ascites.  No pneumoperitoneum. Musculoskeletal: There are no aggressive appearing lytic or blastic lesions noted in the visualized portions of the skeleton. VASCULAR MEASUREMENTS PERTINENT TO TAVR: AORTA: Minimal Aortic Diameter -  16 x 16 mm Severity of Aortic Calcification -  mild RIGHT PELVIS: Right Common Iliac Artery - Minimal Diameter - 9.9 x 10.3 mm Tortuosity - mild Calcification - mild Right External Iliac Artery - Minimal Diameter - 8.8 x 8.4 mm Tortuosity - severe Calcification - none Right Common Femoral Artery - Minimal Diameter - 8.6 x 9.4 mm Tortuosity - mild Calcification - mild LEFT PELVIS: Left Common Iliac Artery - Minimal Diameter - 10.9 x 10.3 mm Tortuosity - mild Calcification - mild Left External Iliac Artery - Minimal Diameter - 8.4 x 7.5 mm Tortuosity - severe Calcification - none Left Common Femoral Artery - Minimal Diameter - 8.8 x 8.5 mm Tortuosity - mild Calcification - none Review of the MIP images confirms the above findings. IMPRESSION: 1. Vascular findings and measurements pertinent to potential TAVR procedure, as detailed above. This patient has suitable pelvic arterial access bilaterally. 2. Severe thickening calcification of the aortic valve, compatible with the reported clinical history of severe aortic stenosis. 3. Cardiomegaly with left atrial dilatation. 4. Aortic atherosclerosis, in  addition to left main and 3 vessel coronary artery disease. Status post median sternotomy for CABG including LIMA to the LAD. 5. Cholelithiasis without evidence of acute cholecystitis at this time. 6.  Colonic diverticulosis without evidence of acute diverticulitis at this time. 7. Additional incidental findings, as above. Electronically Signed   By: Trudie Reedaniel  Entrikin M.D.   On: 05/21/2017 16:53     I have independently reviewed the above radiologic studies.  Recent Lab Findings: Lab Results  Component Value Date   WBC 5.0 05/07/2017   HGB 13.0 05/07/2017   HCT 40.2 05/07/2017   PLT 134 (L) 05/07/2017   GLUCOSE 113 (H) 05/08/2017   CHOL 141 04/13/2015   TRIG 73.0 04/13/2015   HDL 55.00 04/13/2015   LDLCALC 71 04/13/2015   ALT 21 05/07/2017   AST 26 05/07/2017   NA 138 05/08/2017   K 3.8 05/08/2017   CL 106 05/08/2017   CREATININE 1.17 05/08/2017   BUN 19 05/08/2017   CO2 24 05/08/2017   TSH 1.18 04/13/2015   INR 1.7 (H) 05/07/2008   HGBA1C  05/06/2008    5.8 (NOTE)   The ADA recommends the following therapeutic goals for glycemic   control related to Hgb A1C measurement:   Goal of Therapy:   < 7.0% Hgb A1C   Action Suggested:  > 8.0% Hgb A1C   Ref:  Diabetes Care, 22, Suppl. 1, 1999    ECHO:      *[facility]*                   *[facility]*                         [address]                        [place], [place] [address]                            [phone]  ------------------------------------------------------------------- Transthoracic Echocardiography  Patient:    Candelaria CelesteManges, Carless C MR #:       [id number] Study Date: 05/08/2017 Gender:     M Age:        [age] Height:     177.8 cm Weight:     82.9 kg BSA:        2.04 m^2 Pt. Status: Room:       3W15C   ATTENDING    Georga HackingW. Spencer Tilley, MD [facility]  PERFORMING   Georga HackingW. Spencer Tilley, MD [facility]  ADMITTING    Inetta Fermoilley, William  ORDERING     Tilley, William  REFERRING    Tilley, William  SONOGRAPHER  Thurman Coyerasey Kirkpatrick  cc:  -------------------------------------------------------------------  ------------------------------------------------------------------- Indications:      Aortic  stenosis 424.1.  ------------------------------------------------------------------- History:   PMH:   Atrial fibrillation.  Coronary artery disease. Risk factors:  Hypertension. Dyslipidemia.  ------------------------------------------------------------------- Study Conclusions  - Left ventricle: The cavity size was normal. Wall thickness was   increased in a pattern of moderate LVH. Systolic function was   normal. Wall motion was normal; there were no regional wall   motion abnormalities. - Aortic valve: Mildly calcified annulus. Trileaflet; moderately   thickened, severely calcified leaflets. Cusp separation was   severely reduced. Valve mobility was severely restricted.   Transvalvular velocity was increased. There was severe stenosis.   There was mild regurgitation. Valve area (  VTI): 0.54 cm^2. Valve   area (Vmax): 0.6 cm^2. Valve area (Vmean): 0.5 cm^2. - Mitral valve: There was mild regurgitation. - Left atrium: The atrium was moderately dilated. - Right atrium: The atrium was moderately dilated. - Pulmonary arteries: Systolic pressure was mildly increased. PA   peak pressure: 41 mm Hg (S).  ------------------------------------------------------------------- Labs, prior tests, procedures, and surgery: Coronary artery bypass grafting.  ------------------------------------------------------------------- Study data:  Comparison was made to the study of 06/19/2013.  Study status:  Routine.  Procedure:  The patient reported no pain pre or post test. Transthoracic echocardiography. Image quality was adequate. The study was technically difficult, as a result of poor acoustic windows. Intravenous contrast (Definity) was administered.  Study completion:  There were no complications. Transthoracic echocardiography.  M-mode, complete 2D, spectral Doppler, and color Doppler.  Birthdate:  Patient birthdate: 12-14-1931.  Age:  Patient is 83 yr old.  Sex:  Gender: male. BMI: 26.2  kg/m^2.  Blood pressure:     176/98  Patient status: Inpatient.  Study date:  Study date: 05/08/2017. Study time: 10:59 AM.  [facility]:  [facility].  -------------------------------------------------------------------  ------------------------------------------------------------------- Left ventricle:  The cavity size was normal. Wall thickness was increased in a pattern of moderate LVH. Systolic function was normal. Wall motion was normal; there were no regional wall motion abnormalities.  ------------------------------------------------------------------- Aortic valve:   Mildly calcified annulus. Trileaflet; moderately thickened, severely calcified leaflets. Cusp separation was severely reduced. Valve mobility was severely restricted.  Doppler:  Transvalvular velocity was increased. There was severe stenosis. There was mild regurgitation.    VTI ratio of LVOT to aortic valve: 0.17. Valve area (VTI): 0.54 cm^2. Indexed valve area (VTI): 0.27 cm^2/m^2. Peak velocity ratio of LVOT to aortic valve: 0.19. Valve area (Vmax): 0.6 cm^2. Indexed valve area (Vmax): 0.29 cm^2/m^2. Mean velocity ratio of LVOT to aortic valve: 0.16. Valve area (Vmean): 0.5 cm^2. Indexed valve area (Vmean): 0.25 cm^2/m^2. Mean gradient (S): 41 mm Hg. Peak gradient (S): 69 mm Hg.  ------------------------------------------------------------------- Aorta:  Aortic root: The aortic root was normal in size.  ------------------------------------------------------------------- Mitral valve:   Structurally normal valve.   Mobility was not restricted.  Doppler:  Transvalvular velocity was within the normal range. There was no evidence for stenosis. There was mild regurgitation.    Peak gradient (D): 3 mm Hg.  ------------------------------------------------------------------- Left atrium:  The atrium was moderately dilated.  ------------------------------------------------------------------- Right  ventricle:  The cavity size was normal. Wall thickness was normal. Systolic function was normal.  ------------------------------------------------------------------- Pulmonic valve:   Not well visualized.  The valve appears to be grossly normal.    Doppler:  Transvalvular velocity was within the normal range. There was no evidence for stenosis.  ------------------------------------------------------------------- Tricuspid valve:   Structurally normal valve.    Doppler: Transvalvular velocity was within the normal range. There was mild regurgitation.  ------------------------------------------------------------------- Pulmonary artery:   The main pulmonary artery was normal-sized. Systolic pressure was mildly increased.  ------------------------------------------------------------------- Right atrium:  The atrium was moderately dilated.  ------------------------------------------------------------------- Pericardium:  There was no pericardial effusion.  ------------------------------------------------------------------- Systemic veins: Inferior vena cava: The vessel was dilated. The respirophasic diameter changes were blunted (< 50%).  ------------------------------------------------------------------- Measurements   Left ventricle                           Value          Reference  LV ID, ED, PLAX chordal  45.4  mm       43 - 52  LV ID, ES, PLAX chordal                  30.8  mm       23 - 38  LV fx shortening, PLAX chordal           32    %        >=29  LV PW thickness, ED                      10    mm       ----------  IVS/LV PW ratio, ED                      1.29           <=1.3  Stroke volume, 2D                        57    ml       ----------  Stroke volume/bsa, 2D                    28    ml/m^2   ----------  LV e&', lateral                           9.79  cm/s     ----------  LV E/e&', lateral                         8.55           ----------   LV e&', medial                            5     cm/s     ----------  LV E/e&', medial                          16.74          ----------  LV e&', average                           7.4   cm/s     ----------  LV E/e&', average                         11.32          ----------    Ventricular septum                       Value          Reference  IVS thickness, ED                        12.9  mm       ----------    LVOT                                     Value          Reference  LVOT ID, S  20    mm       ----------  LVOT area                                3.14  cm^2     ----------  LVOT peak velocity, S                    79.2  cm/s     ----------  LVOT mean velocity, S                    45.8  cm/s     ----------  LVOT VTI, S                              18.2  cm       ----------  LVOT peak gradient, S                    3     mm Hg    ----------    Aortic valve                             Value          Reference  Aortic valve peak velocity, S            416   cm/s     ----------  Aortic valve mean velocity, S            286   cm/s     ----------  Aortic valve VTI, S                      105   cm       ----------  Aortic mean gradient, S                  41    mm Hg    ----------  Aortic peak gradient, S                  69    mm Hg    ----------  VTI ratio, LVOT/AV                       0.17           ----------  Aortic valve area, VTI                   0.54  cm^2     ----------  Aortic valve area/bsa, VTI               0.27  cm^2/m^2 ----------  Velocity ratio, peak, LVOT/AV            0.19           ----------  Aortic valve area, peak velocity         0.6   cm^2     ----------  Aortic valve area/bsa, peak              0.29  cm^2/m^2 ----------  velocity  Velocity ratio, mean, LVOT/AV            0.16           ----------  Aortic valve area, mean velocity  0.5   cm^2     ----------  Aortic valve area/bsa, mean              0.25  cm^2/m^2  ----------  velocity  Aortic regurg pressure half-time         469   ms       ----------    Aorta                                    Value          Reference  Aortic root ID, ED                       32    mm       ----------    Left atrium                              Value          Reference  LA ID, A-P, ES                           41    mm       ----------  LA ID/bsa, A-P                           2.01  cm/m^2   <=2.2  LA volume, S                             83    ml       ----------  LA volume/bsa, S                         40.8  ml/m^2   ----------  LA volume, ES, 1-p A4C                   75.1  ml       ----------  LA volume/bsa, ES, 1-p A4C               36.9  ml/m^2   ----------  LA volume, ES, 1-p A2C                   77.7  ml       ----------  LA volume/bsa, ES, 1-p A2C               38.2  ml/m^2   ----------    Mitral valve                             Value          Reference  Mitral E-wave peak velocity              83.7  cm/s     ----------  Mitral A-wave peak velocity              30.1  cm/s     ----------  Mitral deceleration time         (L)     130   ms       150 - 230  Mitral peak gradient, D  3     mm Hg    ----------  Mitral E/A ratio, peak                   2.8            ----------    Pulmonary arteries                       Value          Reference  PA pressure, S, DP               (H)     41    mm Hg    <=30    Tricuspid valve                          Value          Reference  Tricuspid regurg peak velocity           286   cm/s     ----------  Tricuspid peak RV-RA gradient            33    mm Hg    ----------    Right atrium                             Value          Reference  RA ID, S-I, ES, A4C              (H)     56.2  mm       34 - 49  RA area, ES, A4C                         18.9  cm^2     8.3 - 19.5  RA volume, ES, A/L                       51    ml       ----------  RA volume/bsa, ES, A/L                   25.1  ml/m^2    ----------    Systemic veins                           Value          Reference  Estimated CVP                            8     mm Hg    ----------    Right ventricle                          Value          Reference  TAPSE                                    14.3  mm       ----------  RV pressure, S, DP               (H)     41    mm Hg    <=  30  RV s&', lateral, S                        7.07  cm/s     ----------  Legend: (L)  and  (H)  mark values outside specified reference range.  ------------------------------------------------------------------- Prepared and Electronically Authenticated by  Georga Hacking, MD [facility] 2018-06-20T14:37:01  CATH: Procedures   Right/Left Heart Cath and Coronary/Graft Angiography  Conclusion     LM lesion, 70 %stenosed.  Prox LAD lesion, 60 %stenosed.  Mid LAD lesion, 95 %stenosed.  Ramus lesion, 60 %stenosed.  Ost 1st Mrg lesion, 90 %stenosed.  LIMA and is normal in caliber and anatomically normal.  SVG and is anatomically normal.  Prox RCA to Mid RCA lesion, 100 %stenosed.  And is anatomically normal.  Mid RCA lesion, 75 %stenosed.  And is anatomically normal.  There is severe aortic valve stenosis. There is mild (2+) aortic regurgitation.  There is trivial (1+) mitral regurgitation.  There is mild (2+) aortic regurgitation.   Moderate right heart pressure elevation with moderate pulmonary hypertension with a PA systolic pressure at 53-55 mm Hg.  Severe aortic valve stenosis with a peak to peak (not PI)  gradient of 50 mmHg, mean  52 mm Hg, and aortic valve area of 0.4 cm.  Severe native CAD with distal left main stenosis, 60% proximal LAD stenosis followed by 95% stenosis; ostial ramus stenosis of at least 60%, subtotally occluded OM1 vessel of the left circumflex, and total proximal RCA occlusion.  Patent LIMA graft supplying the mid LAD.  Patent sequential SVG supplying the ramus and OM1 vessel.  Patent  SVG supplying the diagonal vessel.  Patent SVG supplying the mid RCA.  RECOMMENDATION: Dr. Donnie Aho was notified of the results of the cardiac catheterization.  The patient will be undergoing evaluation for consideration of TAVR.   STS Risk Calculator: Procedure: AV Replacement  Risk of Mortality: 4.634%  Morbidity or Mortality: 22.852%  Long Length of Stay: 8.952%  Short Length of Stay: 22.519%  Permanent Stroke: 2.191%  Prolonged Ventilation: 15.146%  DSW Infection: 0.381%  Renal Failure: 6.843%  Reoperation: 8.564%   Assessment / Plan:   Patient with known underlying coronary artery disease status post previous coronary artery bypass grafting 9 years ago presents with stage D aortic stenosis with class II symptoms of heart failure. He has severe calcific aortic stenosis with peak and mean transvalvular gradients of 69 and 41 calculated aortic valve area 0.6 cm squares. Recent cardiac catheterization confirms severe native three-vessel coronary artery disease with patent grafts and normal perfusion.  With the patient's age redo status placement of aTAVR aortic valve would benefit him the most with at least risk. With his age of 73 years redo status standard redo cardiac surgery would be difficult for him to recover. He has discussed this with Dr. Excell Seltzer and the patient is willing to proceed.    STS Risk Calculator: Procedure: AV Replacement  Risk of Mortality: 4.634%  Morbidity or Mortality: 22.852%  Long Length of Stay: 8.952%  Short Length of Stay: 22.519%  Permanent Stroke: 2.191%  Prolonged Ventilation: 15.146%  DSW Infection: 0.381%  Renal Failure: 6.843%  Reoperation: 8.564%   I  spent 40 minutes counseling the patient face to face and 50% or more the  time was spent in counseling and coordination of care. The total time spent in the appointment was 60 minutes.  Delight Ovens MD      [address] [place] [address] Office  [phone]   Beeper [phone]  05/24/2017 11:31 AM

## 2017-05-30 ENCOUNTER — Encounter: Payer: Medicare Other | Admitting: Cardiothoracic Surgery

## 2017-06-04 ENCOUNTER — Encounter: Payer: Medicare Other | Admitting: Surgery

## 2017-06-07 ENCOUNTER — Encounter: Payer: Self-pay | Admitting: Thoracic Surgery (Cardiothoracic Vascular Surgery)

## 2017-06-07 ENCOUNTER — Institutional Professional Consult (permissible substitution) (INDEPENDENT_AMBULATORY_CARE_PROVIDER_SITE_OTHER): Payer: Medicare Other | Admitting: Thoracic Surgery (Cardiothoracic Vascular Surgery)

## 2017-06-07 VITALS — BP 104/58 | HR 58 | Resp 16 | Ht 70.0 in | Wt 173.0 lb

## 2017-06-07 DIAGNOSIS — Z951 Presence of aortocoronary bypass graft: Secondary | ICD-10-CM

## 2017-06-07 DIAGNOSIS — I350 Nonrheumatic aortic (valve) stenosis: Secondary | ICD-10-CM | POA: Diagnosis not present

## 2017-06-07 NOTE — Progress Notes (Addendum)
[facility]  MULTIDISCIPLINARY HEART VALVE CLINIC  CARDIOTHORACIC SURGERY CONSULTATION REPORT  Referring Provider is [name], MD PCP is [name], MD  Chief Complaint  Patient presents with  . Aortic Stenosis    SEVERE...2ND TAVR EVAL...consulted with Dr. Tyrone Sage on 05/24/17    HPI:  Patient is an 83 year old male with history of aortic stenosis, coronary artery disease status post coronary artery bypass grafting in 2009, hypertension, hyperlipidemia, and persistent atrial fibrillation currently maintaining sinus rhythm since DC cardioversion on amiodarone and Xarelto for long-term anticoagulation who has been referred for a second surgical opinion to discuss treatment options for management of severe symptomatic aortic stenosis. The patient has known of presence of a heart murmur since childhood. He was followed for many years by Dr. Patty Sermons and remain physically active and functionally independent all of his adult life. In 2009 he presented with symptoms of chest discomfort and was found to have three-vessel coronary artery disease. He underwent coronary artery bypass grafting 5 by Dr. Tyrone Sage at that time. She did well clinically and has continued to do well until recently. For the last several years he has been followed by Dr. Donnie Aho, and transthoracic echocardiograms have demonstrated the development of severe aortic stenosis. The patient remains fairly active physically although he does experience some mild exertional shortness of breath and decreased energy. He states that he has slowed down a bit over the past year or 2. His wife suffers from Alzheimer's and has been in declining health for some time now. The patient states that he has become more fatigued and a little bit more unsteady on his feet. Recent follow-up transthoracic echocardiogram revealed the presence of severe aortic stenosis with peak velocity across the aortic valve measured greater than 4.1  m/s corresponding to mean transvalvular gradient estimated 41 mmHg. Left ventricular systolic function remains normal. The patient underwent diagnostic cardiac catheterization and was found to have continued patency of all 5 bypass grafts placed at the time of his surgery in 2009. He has severe native coronary artery disease. The patient was referred for surgical consultation and has been evaluated previously by Dr. Tyrone Sage felt the patient would be at relatively high risk for conventional surgical aortic valve replacement via redo sternotomy. Patient underwent CT angiography and has been referred for a second surgical opinion.  The patient is married and lives in an apartment at [employer] senior community in [place]. His wife lives in the memory unit there where she is cared for because of progressive Alzheimer's dementia.  The patient has remained reasonably active physically all of his adult life. He is to walk a fair amount on a regular basis but he admits that he has gradually been cutting back over the past year or 2. He states that he gets fatigue more than he used to. He states that he does not have much trouble with shortness of breath unless he exerts himself. He denies any resting shortness of breath, PND, orthopnea, or lower extremity edema. He recently developed atrial fibrillation which set him back considerably and was associated with chest discomfort and shortness of breath. He underwent DC cardioversion on 2 occasions and reportedly has been maintaining sinus rhythm since his second cardioversion on oral amiodarone.   Past Medical History:  Diagnosis Date  . Aortic stenosis 07/06/2013   Echocardiogram on 06/19/13 and showed mild to moderate aortic stenosis.  His ejection fraction is 55-60%.   Marland Kitchen BPH (benign prostatic hyperplasia) 05/30/2011  . CAD (coronary artery  disease), native coronary artery 03/07/2017   Cath 2009 with 60% LM and 95% RCA  CABG 6//19/09 with LIMA to LAD, SVG to RCA, SVG to  int-OM and SVG to Dx Dr. Tyrone Sage  . ED (erectile dysfunction)   . Gilbert syndrome 12/16/2015  . Gout   . Hyperlipidemia   . Hypertension   . OA (osteoarthritis)   . PAC (premature atrial contraction)   . Persistent atrial fibrillation (HCC) 06/16/2013   CHA2DS2VASC score 4 Cardioversion 03/07/17 with reversion in one week Cardioversion 04/10/17 after loading with amiodarone with sinus     Past Surgical History:  Procedure Laterality Date  . CARDIOVASCULAR STRESS TEST  11/03/04   EF 66%  . CARDIOVERSION N/A 03/07/2017   Procedure: CARDIOVERSION;  Surgeon: [name], MD;  Location: [facility] ENDOSCOPY;  Service: Cardiovascular;  Laterality: N/A;  . CARDIOVERSION N/A 04/10/2017   Procedure: CARDIOVERSION;  Surgeon: [name], MD;  Location: [facility] ENDOSCOPY;  Service: Cardiovascular;  Laterality: N/A;  . COLONOSCOPY  2000  . CORONARY ARTERY BYPASS GRAFT  04/2008  . RIGHT/LEFT HEART CATH AND CORONARY/GRAFT ANGIOGRAPHY N/A 05/09/2017   Procedure: Right/Left Heart Cath and Coronary/Graft Angiography;  Surgeon: Lennette Bihari, MD;  Location: [facility] INVASIVE CV LAB;  Service: Cardiovascular;  Laterality: N/A;  . TONSILLECTOMY AND ADENOIDECTOMY      Family History  Problem Relation Age of Onset  . Heart attack Father   . Heart attack Brother     Social History   Social History  . Marital status: Married    Spouse name: N/A  . Number of children: N/A  . Years of education: N/A   Occupational History  . Not on file.   Social History Main Topics  . Smoking status: Never Smoker  . Smokeless tobacco: Never Used  . Alcohol use No  . Drug use: No  . Sexual activity: Not on file   Other Topics Concern  . Not on file   Social History Narrative  . No narrative on file    Current Outpatient Prescriptions  Medication Sig Dispense Refill  . amiodarone (PACERONE) 200 MG tablet Take 200 mg by mouth daily.    . diazepam (VALIUM) 5 MG tablet Take 5 mg by mouth at bedtime as needed  (sleep).    . losartan (COZAAR) 50 MG tablet Take 1 tablet (50 mg total) by mouth daily. 90 tablet 1  . metoprolol tartrate (LOPRESSOR) 50 MG tablet Take 0.5 tablets (25 mg total) by mouth 2 (two) times daily.    . Rivaroxaban (XARELTO) 20 MG TABS tablet Take 1 tablet (20 mg total) by mouth daily with supper. 30 tablet 0  . rosuvastatin (CRESTOR) 5 MG tablet Take 1 tablet (5 mg total) by mouth 4 (four) times a week. FOUR times a week (Patient taking differently: Take 5 mg by mouth 3 (three) times a week. Mon / Wed / Fri) 48 tablet 0   No current facility-administered medications for this visit.     Allergies  Allergen Reactions  . Lisinopril Cough    cough       Review of Systems:   General:  normal appetite, decreased energy, no weight gain, no weight loss, no fever  Cardiac:  no chest pain with exertion, no chest pain at rest, +SOB with exertion, no resting SOB, no PND, no orthopnea, no palpitations, no arrhythmia, no atrial fibrillation, no LE edema, no dizzy spells, no syncope  Respiratory:  no shortness of breath, no home oxygen, no productive  cough, no dry cough, no bronchitis, no wheezing, no hemoptysis, no asthma, no pain with inspiration or cough, no sleep apnea, no CPAP at night  GI:   no difficulty swallowing, no reflux, no frequent heartburn, no hiatal hernia, no abdominal pain, no constipation, + diarrhea, no hematochezia, no hematemesis, no melena  GU:   no dysuria,  no frequency, no urinary tract infection, no hematuria, no enlarged prostate, no kidney stones, no kidney disease  Vascular:  no pain suggestive of claudication, no pain in feet, occasional leg cramps, no varicose veins, no DVT, no non-healing foot ulcer  Neuro:   no stroke, no TIA's, no seizures, no headaches, no temporary blindness one eye,  no slurred speech, no peripheral neuropathy, no chronic pain, + some instability of gait, + mild memory/cognitive dysfunction  Musculoskeletal: no arthritis, no joint  swelling, no myalgias, no difficulty walking, slightly diminished mobility   Skin:   no rash, no itching, no skin infections, no pressure sores or ulcerations  Psych:   + anxiety, no depression, no nervousness, no unusual recent stress  Eyes:   no blurry vision, no floaters, no recent vision changes, + wears glasses or contacts  ENT:   no hearing loss, no loose or painful teeth, no dentures, last saw dentist 3 months ago  Hematologic:  no easy bruising, no abnormal bleeding, no clotting disorder, no frequent epistaxis  Endocrine:  no diabetes, does not check CBG's at home           Physical Exam:   BP (!) 104/58 (BP Location: Left Arm, Patient Position: Sitting, Cuff Size: Large)   Pulse (!) 58   Resp 16   Ht 5\' 10"  (1.778 m)   Wt 173 lb (78.5 kg)   SpO2 95% Comment: ON RA  BMI 24.82 kg/m   General:  Thin, elderly but o/w  well-appearing  HEENT:  Unremarkable   Neck:   no JVD, no bruits, no adenopathy   Chest:   clear to auscultation, symmetrical breath sounds, no wheezes, no rhonchi   CV:   RRR, grade III/VI crescendo/decrescendo murmur heard best at RSB,  no diastolic murmur  Abdomen:  soft, non-tender, no masses   Extremities:  warm, well-perfused, pulses diminished but palpable, no LE edema  Rectal/GU  Deferred  Neuro:   Grossly non-focal and symmetrical throughout  Skin:   Clean and dry, no rashes, no breakdown   Diagnostic Tests:  Transthoracic Echocardiography  Patient:    Staley, Lunz MR #:       [id number] Study Date: 05/08/2017 Gender:     M Age:        18 Height:     177.8 cm Weight:     82.9 kg BSA:        2.04 m^2 Pt. Status: Room:       [id number]   ATTENDING    Georga Hacking, MD [facility]  PERFORMING   Georga Hacking, MD [facility]  ADMITTING    Inetta Fermo  SONOGRAPHER  Thurman Coyer  cc:  -------------------------------------------------------------------  ------------------------------------------------------------------- Indications:      Aortic stenosis 424.1.  ------------------------------------------------------------------- History:   PMH:   Atrial fibrillation.  Coronary artery disease. Risk factors:  Hypertension. Dyslipidemia.  ------------------------------------------------------------------- Study Conclusions  - Left ventricle: The cavity size was normal. Wall thickness was   increased in a pattern of moderate LVH. Systolic function was   normal.  Wall motion was normal; there were no regional wall   motion abnormalities. - Aortic valve: Mildly calcified annulus. Trileaflet; moderately   thickened, severely calcified leaflets. Cusp separation was   severely reduced. Valve mobility was severely restricted.   Transvalvular velocity was increased. There was severe stenosis.   There was mild regurgitation. Valve area (VTI): 0.54 cm^2. Valve   area (Vmax): 0.6 cm^2. Valve area (Vmean): 0.5 cm^2. - Mitral valve: There was mild regurgitation. - Left atrium: The atrium was moderately dilated. - Right atrium: The atrium was moderately dilated. - Pulmonary arteries: Systolic pressure was mildly increased. PA   peak pressure: 41 mm Hg (S).  ------------------------------------------------------------------- Labs, prior tests, procedures, and surgery: Coronary artery bypass grafting.  ------------------------------------------------------------------- Study data:  Comparison was made to the study of 06/19/2013.  Study status:  Routine.  Procedure:  The patient reported no pain pre or post test. Transthoracic echocardiography. Image quality was adequate. The study was technically difficult, as a result of poor acoustic windows. Intravenous contrast (Definity) was administered.  Study completion:  There were no  complications. Transthoracic echocardiography.  M-mode, complete 2D, spectral Doppler, and color Doppler.  Birthdate:  Patient birthdate: [birth year].  Age:  Patient is 83 yr old.  Sex:  Gender: male. BMI: 26.2 kg/m^2.  Blood pressure:     176/98  Patient status: Inpatient.  Study date:  Study date: 05/08/2017. Study time: 10:59 AM.  Location:  Echo laboratory.  -------------------------------------------------------------------  ------------------------------------------------------------------- Left ventricle:  The cavity size was normal. Wall thickness was increased in a pattern of moderate LVH. Systolic function was normal. Wall motion was normal; there were no regional wall motion abnormalities.  ------------------------------------------------------------------- Aortic valve:   Mildly calcified annulus. Trileaflet; moderately thickened, severely calcified leaflets. Cusp separation was severely reduced. Valve mobility was severely restricted.  Doppler:  Transvalvular velocity was increased. There was severe stenosis. There was mild regurgitation.    VTI ratio of LVOT to aortic valve: 0.17. Valve area (VTI): 0.54 cm^2. Indexed valve area (VTI): 0.27 cm^2/m^2. Peak velocity ratio of LVOT to aortic valve: 0.19. Valve area (Vmax): 0.6 cm^2. Indexed valve area (Vmax): 0.29 cm^2/m^2. Mean velocity ratio of LVOT to aortic valve: 0.16. Valve area (Vmean): 0.5 cm^2. Indexed valve area (Vmean): 0.25 cm^2/m^2. Mean gradient (S): 41 mm Hg. Peak gradient (S): 69 mm Hg.  ------------------------------------------------------------------- Aorta:  Aortic root: The aortic root was normal in size.  ------------------------------------------------------------------- Mitral valve:   Structurally normal valve.   Mobility was not restricted.  Doppler:  Transvalvular velocity was within the normal range. There was no evidence for stenosis. There was mild regurgitation.    Peak gradient (D): 3  mm Hg.  ------------------------------------------------------------------- Left atrium:  The atrium was moderately dilated.  ------------------------------------------------------------------- Right ventricle:  The cavity size was normal. Wall thickness was normal. Systolic function was normal.  ------------------------------------------------------------------- Pulmonic valve:   Not well visualized.  The valve appears to be grossly normal.    Doppler:  Transvalvular velocity was within the normal range. There was no evidence for stenosis.  ------------------------------------------------------------------- Tricuspid valve:   Structurally normal valve.    Doppler: Transvalvular velocity was within the normal range. There was mild regurgitation.  ------------------------------------------------------------------- Pulmonary artery:   The main pulmonary artery was normal-sized. Systolic pressure was mildly increased.  ------------------------------------------------------------------- Right atrium:  The atrium was moderately dilated.  ------------------------------------------------------------------- Pericardium:  There was no pericardial effusion.  ------------------------------------------------------------------- Systemic veins: Inferior vena cava: The vessel was dilated. The respirophasic diameter changes were blunted (< 50%).  -------------------------------------------------------------------  Measurements   Left ventricle                           Value          Reference  LV ID, ED, PLAX chordal                  45.4  mm       43 - 52  LV ID, ES, PLAX chordal                  30.8  mm       23 - 38  LV fx shortening, PLAX chordal           32    %        >=29  LV PW thickness, ED                      10    mm       ----------  IVS/LV PW ratio, ED                      1.29           <=1.3  Stroke volume, 2D                        57    ml       ----------   Stroke volume/bsa, 2D                    28    ml/m^2   ----------  LV e&', lateral                           9.79  cm/s     ----------  LV E/e&', lateral                         8.55           ----------  LV e&', medial                            5     cm/s     ----------  LV E/e&', medial                          16.74          ----------  LV e&', average                           7.4   cm/s     ----------  LV E/e&', average                         11.32          ----------    Ventricular septum                       Value          Reference  IVS thickness, ED                        12.9  mm       ----------  LVOT                                     Value          Reference  LVOT ID, S                               20    mm       ----------  LVOT area                                3.14  cm^2     ----------  LVOT peak velocity, S                    79.2  cm/s     ----------  LVOT mean velocity, S                    45.8  cm/s     ----------  LVOT VTI, S                              18.2  cm       ----------  LVOT peak gradient, S                    3     mm Hg    ----------    Aortic valve                             Value          Reference  Aortic valve peak velocity, S            416   cm/s     ----------  Aortic valve mean velocity, S            286   cm/s     ----------  Aortic valve VTI, S                      105   cm       ----------  Aortic mean gradient, S                  41    mm Hg    ----------  Aortic peak gradient, S                  69    mm Hg    ----------  VTI ratio, LVOT/AV                       0.17           ----------  Aortic valve area, VTI                   0.54  cm^2     ----------  Aortic valve area/bsa, VTI               0.27  cm^2/m^2 ----------  Velocity ratio, peak, LVOT/AV            0.19           ----------  Aortic valve area, peak  velocity         0.6   cm^2     ----------  Aortic valve area/bsa, peak              0.29  cm^2/m^2 ----------   velocity  Velocity ratio, mean, LVOT/AV            0.16           ----------  Aortic valve area, mean velocity         0.5   cm^2     ----------  Aortic valve area/bsa, mean              0.25  cm^2/m^2 ----------  velocity  Aortic regurg pressure half-time         469   ms       ----------    Aorta                                    Value          Reference  Aortic root ID, ED                       32    mm       ----------    Left atrium                              Value          Reference  LA ID, A-P, ES                           41    mm       ----------  LA ID/bsa, A-P                           2.01  cm/m^2   <=2.2  LA volume, S                             83    ml       ----------  LA volume/bsa, S                         40.8  ml/m^2   ----------  LA volume, ES, 1-p A4C                   75.1  ml       ----------  LA volume/bsa, ES, 1-p A4C               36.9  ml/m^2   ----------  LA volume, ES, 1-p A2C                   77.7  ml       ----------  LA volume/bsa, ES, 1-p A2C               38.2  ml/m^2   ----------    Mitral valve                             Value          Reference  Mitral E-wave  peak velocity              83.7  cm/s     ----------  Mitral A-wave peak velocity              30.1  cm/s     ----------  Mitral deceleration time         (L)     130   ms       150 - 230  Mitral peak gradient, D                  3     mm Hg    ----------  Mitral E/A ratio, peak                   2.8            ----------    Pulmonary arteries                       Value          Reference  PA pressure, S, DP               (H)     41    mm Hg    <=30    Tricuspid valve                          Value          Reference  Tricuspid regurg peak velocity           286   cm/s     ----------  Tricuspid peak RV-RA gradient            33    mm Hg    ----------    Right atrium                             Value          Reference  RA ID, S-I, ES, A4C              (H)     56.2  mm       34 -  49  RA area, ES, A4C                         18.9  cm^2     8.3 - 19.5  RA volume, ES, A/L                       51    ml       ----------  RA volume/bsa, ES, A/L                   25.1  ml/m^2   ----------    Systemic veins                           Value          Reference  Estimated CVP                            8     mm Hg    ----------    Right ventricle  Value          Reference  TAPSE                                    14.3  mm       ----------  RV pressure, S, DP               (H)     41    mm Hg    <=30  RV s&', lateral, S                        7.07  cm/s     ----------  Legend: (L)  and  (H)  mark values outside specified reference range.  ------------------------------------------------------------------- Prepared and Electronically Authenticated by  Georga Hacking, MD [facility] 2018-06-20T14:37:01   Right/Left Heart Cath and Coronary/Graft Angiography  Conclusion     LM lesion, 70 %stenosed.  Prox LAD lesion, 60 %stenosed.  Mid LAD lesion, 95 %stenosed.  Ramus lesion, 60 %stenosed.  Ost 1st Mrg lesion, 90 %stenosed.  LIMA and is normal in caliber and anatomically normal.  SVG and is anatomically normal.  Prox RCA to Mid RCA lesion, 100 %stenosed.  And is anatomically normal.  Mid RCA lesion, 75 %stenosed.  And is anatomically normal.  There is severe aortic valve stenosis. There is mild (2+) aortic regurgitation.  There is trivial (1+) mitral regurgitation.  There is mild (2+) aortic regurgitation.   Moderate right heart pressure elevation with moderate pulmonary hypertension with a PA systolic pressure at 53-55 mm Hg.  Severe aortic valve stenosis with a peak to peak (not PI)  gradient of 50 mmHg, mean  52 mm Hg, and aortic valve area of 0.4 cm.  Severe native CAD with distal left main stenosis, 60% proximal LAD stenosis followed by 95% stenosis; ostial ramus stenosis of at least 60%, subtotally occluded OM1  vessel of the left circumflex, and total proximal RCA occlusion.  Patent LIMA graft supplying the mid LAD.  Patent sequential SVG supplying the ramus and OM1 vessel.  Patent SVG supplying the diagonal vessel.  Patent SVG supplying the mid RCA.  RECOMMENDATION: Dr. Donnie Aho was notified of the results of the cardiac catheterization.  The patient will be undergoing evaluation for consideration of TAVR.   Indications   Nonrheumatic aortic valve stenosis [I35.0 (ICD-10-CM)]  Coronary artery disease involving native coronary artery of native heart with angina pectoris (HCC) [I25.119 (ICD-10-CM)]  Procedural Details/Technique   Technical Details Mr. Pearson Picou is a former patient of Dr. Patty Sermons, and he currently is being followed by Dr. Viann Fish. In 2009, he underwent CABG revascularization surgery and had a LIMA placed was LAD, a sequential vein to his ramus and OM 1 vessel, a vein to his diagonal vessel, and a vein to his RCA. He has developed progressive aortic valve stenosis. He also had developed an episode of atrial fibrillation and had undergone cardioversion. He was admitted with chest pain by Dr. Donnie Aho. An echo Doppler study showed normal systolic function with moderate LVH, severe aortic stenosis with a mean gradient of 41, peak instantaneous gradient of 69, and an aortic valve area of 0.6 cm. There was mild MR, biatrial enlargement, and an estimated PA pressure 41 mm. He is now referred for right and left heart cardiac catheterization.   The patient was brought to the second floor [facility] Cardiac cath  lab in the fasting state. The right groin was prepped and draped in sterile fashion and a 6 French arterial sheath and 7 French venous sheath were inserted without difficulty. A Swan-Ganz catheter was advanced into the venous sheath and pressures were obtained in the right atrium, right ventricle, pulmonary artery, and pulmonary capillary wedge position. Cardiac outputs  were obtained by the thermodilution and assumed Fick methods. Oxygen saturation was obtained in the pulmonary artery and aorta. A pigtail catheter was inserted and simultaneous AO/PA pressures were recorded. A straight wire was used and the pigtail catheter was able to use cross the aortic valve and was advanced into the left ventricle. Simultaneous left ventricular and PCW pressures were recorded. Left ventriculography was performed in the RAO projection. A left ventricle to aorta pullback was performed. Supravalvular aortography was performed. The pigtail catheter was then removed and diagnostic catheterization to delineate the coronary anatomy was performed utilizing 5 French Judkins 4 left and right diagnostic catheters. All catheters were removed and the patient. Hemostasis was obtained by direct manual pressure. The patient tolerated the procedure well and returned to his room in satisfactory condition.    Estimated blood loss <50 mL.  During this procedure the patient was administered the following to achieve and maintain moderate conscious sedation: Versed 1 mg, Fentanyl 25 mcg, while the patient's heart rate, blood pressure, and oxygen saturation were continuously monitored. The period of conscious sedation was 52 minutes, of which I was present face-to-face 100% of this time.    Coronary Findings   Dominance: Co-dominant  Left Main  There is left main calcification and 70% distal stenosis prior to trifurcating into the LAD, intermediate, and circumflex vessels.  LM lesion, 70% stenosed.  Left Anterior Descending  The LAD has significant proximal calcification. There is narrowing of 60%. After diagonal vessel. There is 95% focal stenosis before 2 septal perforating arteries and a "flush and fill "phenomena seen due to competitive filling from the LIMA graft.  Prox LAD lesion, 60% stenosed. The lesion is calcified.  Mid LAD lesion, 95% stenosed.  First Diagonal Branch  Vessel is small in  size.  Ramus Intermedius  The ramus intermediate, has at least a 60% ostial proximal stenosis. Competitive filling is seen from the SVG graft.  Ramus lesion, 60% stenosed.  Left Circumflex  Left circumflex is moderate size vessel. There is high-grade, diffuse proximal stenosis of the OM1 vessel which is bypassed. The distal circumflex ends in 3 small vessels.  First Obtuse Marginal Branch  Ost 1st Mrg lesion, 90% stenosed.  Right Coronary Artery  RCA is occluded proximally.  Prox RCA to Mid RCA lesion, 100% stenosed.  Mid RCA lesion, 75% stenosed.  Acute Marginal Branch  Vessel is small in size.  Graft Angiography  Free LIMA Graft to Mid LAD  LIMA and is normal in caliber and anatomically normal. The LIMA graft is angiographically normal anastomoses into the mid LAD. The distal LAD wraps around into the distal inferior wall.  Sequential saphenous Graft to Ramus, 1st Mrg  SVG and is anatomically normal. The graft exhibits . The sequential saphenous vein graft supplying the ramus intermediate and OM1 vessel is widely patent.  Graft to Mid RCA  and is anatomically normal. The graft exhibits . The SVG supplying the mid RCA is widely patent. There is stenosis of 75%, extending proximal to the graft insertion. Distal to the graft. The RCA is free of significant disease and in the PDA vessel and small continuation branch.  Graft  to 2nd Diag  and is anatomically normal. The graft exhibits . The SVG supplying the diagonal vessel is widely patent.  Right Heart   Right Heart Pressures RA: a 16; v 14; mean 13 RV: 59/18 PA: 53/21 PW: a 28; v 38; mean 24  AO: 176/79 PA: 55/23  LV: 224/26 PW: a 32; v 38; mean 27  Pullback: LV: 207/24 AO: 157/74  Oxygen saturation: AO 97%; PA 66%  Cardiac output by the thermodilution method 3.1 and by the Fick method 4.8 L/m. Cardiac index by the thermodilution method 1.6 and by the Fick method 2.4 L/m/m.  Aortic valve: The peak to peak gradient was 50  mmHg (not peak instantaneous gradient of the echo). The mean gradient calculated to 52 mm Hg.  AVA: 0.4 cm2    Wall Motion              Left Heart   Left Ventricle Global left ventricular function is normal with an ejection fraction at 50-55%. 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    Mitral Valve There is trivial (1+) mitral regurgitation.    Aortic Valve There is severe aortic valve stenosis. There is mild (2+) aortic regurgitation. There is restricted aortic valve motion.    Aorta Aortic Root: The aortic root displays dilation. There is mild (2+) aortic regurgitation.    Coronary Diagrams   Diagnostic Diagram       Implants     No implant documentation for this case.  PACS Images   Show images for Cardiac catheterization   Link to Procedure Log   Procedure Log    Hemo Data    Most Recent Value  Fick Cardiac Output 4.83 L/min  Fick Cardiac Output Index 2.43 (L/min)/BSA  Thermal Cardiac Output 3.08 L/min  Thermal Cardiac Output Index 1.55 (L/min)/BSA  Aortic Mean Gradient 52 mmHg  Aortic Peak Gradient 50 mmHg  Aortic Valve Area 0.43  Aortic Value Area Index 0.22 cm2/BSA  Mitral Mean Gradient 9.4 mmHg  Mitral Peak Gradient 1 mmHg  Mitral Valve Area Index 0.78 cm2/BSA  RA A Wave 16 mmHg  RA V Wave 14 mmHg  RA Mean 13 mmHg  RV Systolic Pressure 59 mmHg  RV Diastolic Pressure 6 mmHg  RV EDP 18 mmHg  PA Systolic Pressure 55 mmHg  PA Diastolic Pressure 23 mmHg  PA Mean 34 mmHg  PW A Wave 32 mmHg  PW V Wave 38 mmHg  PW Mean 27 mmHg  AO Systolic Pressure 176 mmHg  AO Diastolic Pressure 79 mmHg  AO Mean 117 mmHg  LV Systolic Pressure 224 mmHg  LV Diastolic Pressure 10 mmHg  LV EDP 26 mmHg  Arterial Occlusion Pressure Extended Systolic Pressure 157 mmHg  Arterial Occlusion Pressure Extended Diastolic Pressure 74 mmHg  Arterial Occlusion Pressure Extended Mean Pressure 107 mmHg  Left Ventricular Apex Extended Systolic  Pressure 207 mmHg  Left Ventricular Apex Extended Diastolic Pressure 12 mmHg  Left Ventricular Apex Extended EDP Pressure 24 mmHg  TPVR Index 21.32 HRUI  TSVR Index 74.3 HRUI  PVR SVR Ratio 0.09  TPVR/TSVR Ratio 0.29    Cardiac TAVR CT  TECHNIQUE: The patient was scanned on a Siemens 128 slice scanner. A 120 kV retrospective scan was triggered in the ascending thoracic aorta at 120 HU's. Gantry rotation speed was 300 msecs and collimation was .9 mm. No beta blockade or nitro were given. The 3D data set was reconstructed in 5% intervals of the R-R cycle. Systolic  and diastolic phases were analyzed on a dedicated work station using MPR, MIP and VRT modes. The patient received 80 cc of contrast.  FINDINGS: Aortic Valve:  Tri leaflet with severely calcified leaflets  Aorta: Normal origin great vessels No aneurysm 3 patent SVG;s and a patent LIMA are noted  Sino-tubular Junction:  26.5 mm  Ascending Thoracic Aorta:  33 mm  Aortic Arch:  26 mm  Descending Thoracic Aorta:  25 mm  Sinus of Valsalva Measurements:  Non-coronary:  30.5 mm  Right - coronary:  30 mm  Left -   coronary:  30.8 mm  Coronary Artery Height above Annulus:  Left Main:  12.7 mm above annulus with patent LIMA  Right Coronary:  13.8 mm above annulus with patent SVG;s  Virtual Basal Annulus Measurements:  Maximum / Minimum Diameter:  26.8 mm x 20.6 mm  Perimeter:  76.7 mm  Area:  442 mm2  Coronary Arteries: Sufficient height above annulus for deployment with patent bypass grafts  Optimum Fluoroscopic Angle for Delivery: LAO 15 degrees Cranial 2 degrees  IMPRESSION: 1) Severely calcified tri leaflet aortic valve with annulus 442 mm2 suitable for a 26 mm Sapien 3 valve  2) Optimum angiographic angle for deployment LAO 15 degrees Cranial 2 degrees  3) Normal aortic root  4) Coronary arteries sufficient height above annulus for deployment with patent bypass  grafts  5) Moderate bi atrial enlargement. Small filling defect in most distal tip of LAA that clears with cardiac cycle likely pectinate muscle  Charlton Haws  Electronically Signed: By: Charlton Haws M.D. On: 05/21/2017 16:23   CT ANGIOGRAPHY CHEST, ABDOMEN AND PELVIS  TECHNIQUE: Multidetector CT imaging through the chest, abdomen and pelvis was performed using the standard protocol during bolus administration of intravenous contrast. Multiplanar reconstructed images and MIPs were obtained and reviewed to evaluate the vascular anatomy.  CONTRAST:  50 mL of Isovue 370.  COMPARISON:  Chest CT 12/17/2003.  FINDINGS: CTA CHEST FINDINGS  Cardiovascular: Heart size is mildly enlarged with left atrial dilatation. There is no significant pericardial fluid, thickening or pericardial calcification. There is aortic atherosclerosis, as well as atherosclerosis of the great vessels of the mediastinum and the coronary arteries, including calcified atherosclerotic plaque in the left main, left anterior descending, left circumflex and right coronary arteries. Status post median sternotomy for CABG, including LIMA to the LAD. Severe thickening calcification of the aortic valve.  Mediastinum/Lymph Nodes: No pathologically enlarged mediastinal or hilar lymph nodes. Densely calcified right hilar lymph node incidentally noted. Esophagus is unremarkable in appearance. No axillary lymphadenopathy.  Lungs/Pleura: Calcified granuloma in the right upper lobe. No other suspicious appearing pulmonary nodules or masses are noted. No acute consolidative airspace disease. No pleural effusions.  Musculoskeletal/Soft Tissues: Median sternotomy wires. There are no aggressive appearing lytic or blastic lesions noted in the visualized portions of the skeleton.  CTA ABDOMEN AND PELVIS FINDINGS  Hepatobiliary: No suspicious cystic or solid hepatic lesions. No intra or extrahepatic  biliary ductal dilatation. Small calcified gallstones lying dependently in the gallbladder. No findings to suggest an acute cholecystitis at this time.  Pancreas: No pancreatic mass. No pancreatic ductal dilatation. No pancreatic or peripancreatic fluid or inflammatory changes.  Spleen: Tiny calcified granulomas in the spleen. Otherwise, unremarkable.  Adrenals/Urinary Tract: Bilateral kidneys and bilateral adrenal glands are normal in appearance. There is no hydroureteronephrosis or perinephric stranding to suggest urinary tract obstruction at this time. Tiny diverticulae extending off the anterior aspect of the urinary bladder incidentally noted.  Stomach/Bowel:  The appearance of the stomach is normal. There is no pathologic dilatation of small bowel or colon. Extensive colonic diverticulosis is noted, particularly in the descending colon and sigmoid colon, without surrounding inflammatory changes to suggest an acute diverticulitis at this time. Normal appendix.  Vascular/Lymphatic: Aortic atherosclerosis, with vascular findings and measurements pertinent to potential TAVR procedure, as detailed below. No aneurysm or dissection noted in the abdominal or pelvic vasculature. Celiac axis, superior mesenteric artery and inferior mesenteric artery are all widely patent without hemodynamically significant stenosis. Two right-sided renal arteries and 1 left renal artery are all widely patent without hemodynamically significant stenosis. There are numerous prominent but nonenlarged retroperitoneal lymph nodes incidentally noted (nonspecific). No other pathologically enlarged abdominal or pelvic lymph nodes are identified.  Reproductive: Median lobe hypertrophy of the prostate gland. Seminal vesicles are unremarkable in appearance.  Other: No significant volume of ascites.  No pneumoperitoneum.  Musculoskeletal: There are no aggressive appearing lytic or blastic lesions noted  in the visualized portions of the skeleton.  VASCULAR MEASUREMENTS PERTINENT TO TAVR:  AORTA:  Minimal Aortic Diameter -  16 x 16 mm  Severity of Aortic Calcification -  mild  RIGHT PELVIS:  Right Common Iliac Artery -  Minimal Diameter - 9.9 x 10.3 mm  Tortuosity - mild  Calcification - mild  Right External Iliac Artery -  Minimal Diameter - 8.8 x 8.4 mm  Tortuosity - severe  Calcification - none  Right Common Femoral Artery -  Minimal Diameter - 8.6 x 9.4 mm  Tortuosity - mild  Calcification - mild  LEFT PELVIS:  Left Common Iliac Artery -  Minimal Diameter - 10.9 x 10.3 mm  Tortuosity - mild  Calcification - mild  Left External Iliac Artery -  Minimal Diameter - 8.4 x 7.5 mm  Tortuosity - severe  Calcification - none  Left Common Femoral Artery -  Minimal Diameter - 8.8 x 8.5 mm  Tortuosity - mild  Calcification - none  Review of the MIP images confirms the above findings.  IMPRESSION: 1. Vascular findings and measurements pertinent to potential TAVR procedure, as detailed above. This patient has suitable pelvic arterial access bilaterally. 2. Severe thickening calcification of the aortic valve, compatible with the reported clinical history of severe aortic stenosis. 3. Cardiomegaly with left atrial dilatation. 4. Aortic atherosclerosis, in addition to left main and 3 vessel coronary artery disease. Status post median sternotomy for CABG including LIMA to the LAD. 5. Cholelithiasis without evidence of acute cholecystitis at this time. 6. Colonic diverticulosis without evidence of acute diverticulitis at this time. 7. Additional incidental findings, as above.   Electronically Signed   By: Trudie Reedaniel  Entrikin M.D.   On: 05/21/2017 16:53   STS Risk Calculator  Procedure    Redo sternotomy for AVR  Risk of Mortality   4.0% Morbidity or Mortality  21.3% Prolonged LOS   7.9% Short  LOS    27.9% Permanent Stroke   2.1% Prolonged Vent Support  13.4% DSW Infection    0.4% Renal Failure    7.0% Reoperation    7.9%   Impression:  Patient has stage D severe symptomatic aortic stenosis. He describes mild symptoms of progressive fatigue, decreased exercise tolerance, and exertional shortness of breath consistent with chronic diastolic congestive heart failure, New [place] Heart Association functional class II.  He has a long-standing history of coronary artery disease status post coronary artery bypass grafting in 2009. I have personally reviewed the patient's recent transthoracic echocardiogram, diagnostic cardiac catheterization, and  CT angiograms. Echocardiogram confirms the presence of severe aortic stenosis with aortic valve appears to be trileaflet with severe thickening, sclerosis, and restricted leaflet mobility involving all 3 leaflets of the aortic valve. Peak velocity across the aortic valve measured greater than 4.1 m/s corresponding to mean transvalvular gradient estimated 41 mmHg. Left ventricular systolic function remains preserved.  Diagnostic cardiac catheterization reveals the presence of severe native coronary artery disease but continued patency of all 5 bypass grafts performed at the time of the patient's bypass surgery in 2009 with no significant vein graft disease or progressive distal native coronary artery disease. Risks associated with conventional surgery via redo median sternotomy would be at least moderately elevated, and I feel that this might be somewhat higher as the patient appears somewhat frail, admits to mild memory problems, and complains of increased instability of gait.  Cardiac-gated CTA of the heart reveals anatomical characteristics consistent with aortic stenosis suitable for treatment by transcatheter aortic valve replacement without any significant complicating features and CTA of the aorta and iliac vessels demonstrate what appears to be adequate  pelvic vascular access to facilitate a transfemoral approach.    Plan:  The patient was counseled at length regarding treatment alternatives for management of severe symptomatic aortic stenosis. Alternative approaches such as conventional aortic valve replacement, transcatheter aortic valve replacement, and palliative medical therapy were compared and contrasted at length.  The risks associated with conventional surgical aortic valve replacement were been discussed in detail, as were expectations for post-operative convalescence, and why I would be reluctant to consider this patient a candidate for conventional surgery.  Issues specific to transcatheter aortic valve replacement were discussed including questions about long term valve durability, the potential for paravalvular leak, possible increased risk of need for permanent pacemaker placement, and other technical complications related to the procedure itself.  Long-term prognosis with medical therapy was discussed. This discussion was placed in the context of the patient's own specific clinical presentation and past medical history.  All of their questions been addressed.  The patient hopes to proceed with transcatheter aortic valve replacement in the near future.  Following the decision to proceed with transcatheter aortic valve replacement, a discussion has been held regarding what types of management strategies would be attempted intraoperatively in the event of life-threatening complications, including whether or not the patient would be considered a candidate for the use of cardiopulmonary bypass and/or conversion to open sternotomy for attempted surgical intervention.  The patient has been advised of a variety of complications that might develop including but not limited to risks of death, stroke, paravalvular leak, aortic dissection or other major vascular complications, aortic annulus rupture, device embolization, cardiac rupture or perforation,  mitral regurgitation, acute myocardial infarction, arrhythmia, heart block or bradycardia requiring permanent pacemaker placement, congestive heart failure, respiratory failure, renal failure, pneumonia, infection, other late complications related to structural valve deterioration or migration, or other complications that might ultimately cause a temporary or permanent loss of functional independence or other long term morbidity.  The patient provides full informed consent for the procedure as described and all questions were answered.  We plan to proceed with transcatheter aortic valve replacement via transfemoral approach on Tuesday, 06/18/2017. The patient has been instructed to stop taking Xarelto after he takes his dose on Friday, 06/14/2017.   I spent in excess of 90 minutes during the conduct of this office consultation and >50% of this time involved direct face-to-face encounter with the patient for counseling and/or coordination of their care.  Salvatore Decent. Cornelius Moras, MD 06/07/2017 1:07 PM

## 2017-06-07 NOTE — Patient Instructions (Signed)
Stop taking Xarelto after you take your dose on Friday 06/14/2017  Continue taking all other medications without change through the day before surgery.  Have nothing to eat or drink after midnight the night before surgery.  On the morning of surgery take only metoprolol with a sip of water.

## 2017-06-11 NOTE — Pre-Procedure Instructions (Signed)
Thomas Diaz  06/11/2017      [id number] - [place], [place] - [address] [place] [address] Phone: [phone] Fax: [phone]  [facility] - [place], [place] - [address] [place] [address] Phone: [phone] Fax: [phone]  [id number] - [place], [place] - [address] [place] [address] Phone: [phone] Fax: [phone]    Your procedure is scheduled on July 31  [facility].M[facility]  Call this number if you have problems the morning of surgery:  [phone]   Remember:  Do not eat food or drink liquids after midnight.   Take these medicines the morning of surgery with A SIP OF WATER amiodarone (PACERONE), diazepam (VALIUM),   7 days prior to surgery STOP taking any Aspirin, Aleve, Naproxen, Ibuprofen, Motrin, Advil, Goody's, BC's, all herbal medications, fish oil, and all vitamins  Follow Physicians Instructions about Xarelto    Do not wear jewelry  Do not wear lotions, powders, or cologne, or deoderant.  Men may shave face and neck.  Do not bring valuables to the hospital.  [facility] is not responsible for any belongings or valuables.  Contacts, dentures or bridgework may not be worn into surgery.  Leave your suitcase in the car.  After surgery it may be brought to your room.  For patients admitted to the hospital, discharge time will be determined by your treatment team.  Patients discharged the day of surgery will not be allowed to drive home.    Special instructions:   [place]- Preparing For Surgery  Before surgery, you can play an important role. Because skin is not sterile, your skin needs to be as free of germs as possible. You can reduce the number of germs on your skin by washing with CHG (chlorahexidine gluconate) Soap before surgery.  CHG is an antiseptic  cleaner which kills germs and bonds with the skin to continue killing germs even after washing.  Please do not use if you have an allergy to CHG or antibacterial soaps. If your skin becomes reddened/irritated stop using the CHG.  Do not shave (including legs and underarms) for at least 48 hours prior to first CHG shower. It is OK to shave your face.  Please follow these instructions carefully.   1. Shower the NIGHT BEFORE SURGERY and the MORNING OF SURGERY with CHG.   2. If you chose to wash your hair, wash your hair first as usual with your normal shampoo.  3. After you shampoo, rinse your hair and body thoroughly to remove the shampoo.  4. Use CHG as you would any other liquid soap. You can apply CHG directly to the skin and wash gently with a scrungie or a clean washcloth.   5. Apply the CHG Soap to your body ONLY FROM THE NECK DOWN.  Do not use on open wounds or open sores. Avoid contact with your eyes, ears, mouth and genitals (private parts). Wash genitals (private parts) with your normal soap.  6. Wash thoroughly, paying special attention to the area where your surgery will be performed.  7. Thoroughly rinse your body with warm water from the neck down.  8. DO NOT shower/wash with your normal soap after using and rinsing off the CHG Soap.  9. Pat yourself dry with a CLEAN TOWEL.   10.  [place]   11. Place CLEAN SHEETS on your bed the night of your first shower and DO NOT SLEEP WITH PETS.    Day of Surgery: Do not apply any deodorants/lotions. Please wear clean clothes to the hospital/surgery center.      Please read over the following fact sheets that you were given.

## 2017-06-12 ENCOUNTER — Encounter: Payer: Medicare Other | Admitting: Thoracic Surgery (Cardiothoracic Vascular Surgery)

## 2017-06-12 ENCOUNTER — Ambulatory Visit (HOSPITAL_COMMUNITY)
Admission: RE | Admit: 2017-06-12 | Discharge: 2017-06-12 | Disposition: A | Discharge status: Home / Self Care | Payer: Medicare Other | Source: Ambulatory Visit | Attending: Cardiovascular Disease | Admitting: Cardiovascular Disease

## 2017-06-12 ENCOUNTER — Encounter (HOSPITAL_COMMUNITY)
Admission: RE | Admit: 2017-06-12 | Discharge: 2017-06-12 | Disposition: A | Discharge status: Home / Self Care | Payer: Medicare Other | Source: Ambulatory Visit | Attending: Cardiovascular Disease | Admitting: Cardiovascular Disease

## 2017-06-12 ENCOUNTER — Encounter (HOSPITAL_COMMUNITY): Payer: Self-pay

## 2017-06-12 DIAGNOSIS — Z951 Presence of aortocoronary bypass graft: Secondary | ICD-10-CM | POA: Diagnosis not present

## 2017-06-12 DIAGNOSIS — I350 Nonrheumatic aortic (valve) stenosis: Secondary | ICD-10-CM | POA: Insufficient documentation

## 2017-06-12 DIAGNOSIS — I2510 Atherosclerotic heart disease of native coronary artery without angina pectoris: Secondary | ICD-10-CM | POA: Insufficient documentation

## 2017-06-12 DIAGNOSIS — I700 Atherosclerosis of aorta: Secondary | ICD-10-CM | POA: Diagnosis not present

## 2017-06-12 DIAGNOSIS — Z952 Presence of prosthetic heart valve: Secondary | ICD-10-CM | POA: Insufficient documentation

## 2017-06-12 HISTORY — DX: Cardiac murmur, unspecified: R01.1

## 2017-06-12 LAB — COMPREHENSIVE METABOLIC PANEL
ALBUMIN: 3.8 g/dL (ref 3.5–5.0)
ALK PHOS: 88 U/L (ref 38–126)
ALT: 28 U/L (ref 17–63)
ANION GAP: 9 (ref 5–15)
AST: 29 U/L (ref 15–41)
BUN: 20 mg/dL (ref 6–20)
CALCIUM: 8.8 mg/dL — AB (ref 8.9–10.3)
CO2: 24 mmol/L (ref 22–32)
CREATININE: 1.2 mg/dL (ref 0.61–1.24)
Chloride: 107 mmol/L (ref 101–111)
GFR calc Af Amer: >60 mL/min (ref >60)
GFR calc non Af Amer: 54 mL/min — ABNORMAL LOW (ref >60)
GLUCOSE: 90 mg/dL (ref 65–99)
Potassium: 4.2 mmol/L (ref 3.5–5.1)
SODIUM: 140 mmol/L (ref 135–145)
Total Bilirubin: 2.3 mg/dL — ABNORMAL HIGH (ref 0.3–1.2)
Total Protein: 6.7 g/dL (ref 6.5–8.1)

## 2017-06-12 LAB — BLOOD GAS, ARTERIAL
Acid-base deficit: 1.8 mmol/L (ref 0.0–2.0)
Bicarbonate: 21.3 mmol/L (ref 20.0–28.0)
Drawn by: 470591
FIO2: 21
O2 SAT: 98 %
Patient temperature: 98.6
pCO2 arterial: 29.2 mmHg — ABNORMAL LOW (ref 32.0–48.0)
pH, Arterial: 7.476 — ABNORMAL HIGH (ref 7.350–7.450)
pO2, Arterial: 96.5 mmHg (ref 83.0–108.0)

## 2017-06-12 LAB — TYPE AND SCREEN
ABO/RH(D): A NEG
Antibody Screen: NEGATIVE

## 2017-06-12 LAB — URINALYSIS, ROUTINE W REFLEX MICROSCOPIC
BACTERIA UA: NONE SEEN
BILIRUBIN URINE: NEGATIVE
Glucose, UA: NEGATIVE mg/dL
Hgb urine dipstick: NEGATIVE
Ketones, ur: NEGATIVE mg/dL
LEUKOCYTES UA: NEGATIVE
Nitrite: NEGATIVE
Protein, ur: 30 mg/dL — AB
SPECIFIC GRAVITY, URINE: 1.023 (ref 1.005–1.030)
pH: 5 (ref 5.0–8.0)

## 2017-06-12 LAB — CBC
HEMATOCRIT: 42.2 % (ref 39.0–52.0)
HEMOGLOBIN: 13.4 g/dL (ref 13.0–17.0)
MCH: 29.5 pg (ref 26.0–34.0)
MCHC: 31.8 g/dL (ref 30.0–36.0)
MCV: 92.7 fL (ref 78.0–100.0)
Platelets: 126 10*3/uL — ABNORMAL LOW (ref 150–400)
RBC: 4.55 MIL/uL (ref 4.22–5.81)
RDW: 14.5 % (ref 11.5–15.5)
WBC: 5.1 10*3/uL (ref 4.0–10.5)

## 2017-06-12 LAB — SURGICAL PCR SCREEN
MRSA, PCR: NEGATIVE
STAPHYLOCOCCUS AUREUS: NEGATIVE

## 2017-06-12 NOTE — Progress Notes (Signed)
PCP - [place]  Cardiologist - Ellwood HandlerWilliam Tilley  Chest x-ray - 06/12/17 EKG - 06/12/17 Stress Test - 10/29/14 ECHO - 05/08/17 Cardiac Cath - 05/09/17  Sending to anesthesia for review of heart hisotry  Last dose of Xarelto on 06/14/17   Patient denies shortness of breath, fever, cough and chest pain at PAT appointment   Patient verbalized understanding of instructions that were given to them at the PAT appointment. Patient was also instructed that they will need to review over the PAT instructions again at home before surgery.

## 2017-06-13 LAB — HEMOGLOBIN A1C
HEMOGLOBIN A1C: 5.7 % — AB (ref 4.8–5.6)
MEAN PLASMA GLUCOSE: 117 mg/dL

## 2017-06-14 ENCOUNTER — Other Ambulatory Visit (HOSPITAL_COMMUNITY): Payer: Medicare Other

## 2017-06-17 MED ORDER — DEXTROSE 5 % IV SOLN
1.5000 g | INTRAVENOUS | Status: DC
  Filled 2017-06-17 (×2): qty 1.5

## 2017-06-17 MED ORDER — VANCOMYCIN HCL 10 G IV SOLR
1250.0000 mg | INTRAVENOUS | Status: DC
  Filled 2017-06-17: qty 1250

## 2017-06-17 MED ORDER — NITROGLYCERIN IN D5W 200-5 MCG/ML-% IV SOLN
2.0000 ug/min | INTRAVENOUS | Status: DC
  Filled 2017-06-17: qty 250

## 2017-06-17 MED ORDER — POTASSIUM CHLORIDE 2 MEQ/ML IV SOLN
80.0000 meq | INTRAVENOUS | Status: DC
  Filled 2017-06-17: qty 40

## 2017-06-17 MED ORDER — MAGNESIUM SULFATE 50 % IJ SOLN
40.0000 meq | INTRAMUSCULAR | Status: DC
  Filled 2017-06-17: qty 10

## 2017-06-17 MED ORDER — DEXMEDETOMIDINE HCL IN NACL 400 MCG/100ML IV SOLN
0.1000 ug/kg/h | INTRAVENOUS | Status: AC
  Administered 2017-06-18: 1 ug/kg/h via INTRAVENOUS
  Filled 2017-06-17: qty 100

## 2017-06-17 MED ORDER — INSULIN REGULAR HUMAN 100 UNIT/ML IJ SOLN
INTRAMUSCULAR | Status: DC
  Filled 2017-06-17: qty 1

## 2017-06-17 MED ORDER — NOREPINEPHRINE BITARTRATE 1 MG/ML IV SOLN
0.0000 ug/min | INTRAVENOUS | Status: DC
  Filled 2017-06-17: qty 4

## 2017-06-17 MED ORDER — EPINEPHRINE PF 1 MG/ML IJ SOLN
0.0000 ug/min | INTRAVENOUS | Status: DC
  Filled 2017-06-17: qty 4

## 2017-06-17 MED ORDER — SODIUM CHLORIDE 0.9 % IV SOLN
INTRAVENOUS | Status: DC
  Filled 2017-06-17: qty 30

## 2017-06-17 MED ORDER — DOPAMINE-DEXTROSE 3.2-5 MG/ML-% IV SOLN
0.0000 ug/kg/min | INTRAVENOUS | Status: DC
  Filled 2017-06-17: qty 250

## 2017-06-17 MED ORDER — SODIUM CHLORIDE 0.9 % IV SOLN
30.0000 ug/min | INTRAVENOUS | Status: DC
  Filled 2017-06-17: qty 2

## 2017-06-18 ENCOUNTER — Telehealth: Payer: Self-pay | Admitting: Physician Assistant

## 2017-06-18 ENCOUNTER — Encounter (HOSPITAL_COMMUNITY)
Admission: RE | Disposition: A | Discharge status: Home / Self Care | Payer: Self-pay | Source: Ambulatory Visit | Attending: Thoracic Surgery (Cardiothoracic Vascular Surgery)

## 2017-06-18 ENCOUNTER — Encounter (HOSPITAL_COMMUNITY): Payer: Self-pay | Admitting: Thoracic Surgery (Cardiothoracic Vascular Surgery)

## 2017-06-18 ENCOUNTER — Inpatient Hospital Stay (HOSPITAL_COMMUNITY): Payer: Medicare Other

## 2017-06-18 ENCOUNTER — Inpatient Hospital Stay (HOSPITAL_COMMUNITY): Payer: Medicare Other | Admitting: Certified Registered"

## 2017-06-18 ENCOUNTER — Inpatient Hospital Stay (HOSPITAL_COMMUNITY)
Admission: RE | Admit: 2017-06-18 | Discharge: 2017-06-19 | DRG: 267 | Disposition: A | Discharge status: Home / Self Care | Payer: Medicare Other | Source: Ambulatory Visit | Attending: Thoracic Surgery (Cardiothoracic Vascular Surgery) | Admitting: Thoracic Surgery (Cardiothoracic Vascular Surgery)

## 2017-06-18 ENCOUNTER — Inpatient Hospital Stay (HOSPITAL_COMMUNITY): Payer: Medicare Other | Admitting: Vascular Surgery

## 2017-06-18 DIAGNOSIS — I5032 Chronic diastolic (congestive) heart failure: Secondary | ICD-10-CM | POA: Diagnosis present

## 2017-06-18 DIAGNOSIS — Z7901 Long term (current) use of anticoagulants: Secondary | ICD-10-CM | POA: Diagnosis not present

## 2017-06-18 DIAGNOSIS — I481 Persistent atrial fibrillation: Secondary | ICD-10-CM | POA: Diagnosis present

## 2017-06-18 DIAGNOSIS — I110 Hypertensive heart disease with heart failure: Secondary | ICD-10-CM | POA: Diagnosis present

## 2017-06-18 DIAGNOSIS — I480 Paroxysmal atrial fibrillation: Secondary | ICD-10-CM | POA: Diagnosis present

## 2017-06-18 DIAGNOSIS — I25119 Atherosclerotic heart disease of native coronary artery with unspecified angina pectoris: Secondary | ICD-10-CM | POA: Diagnosis present

## 2017-06-18 DIAGNOSIS — E785 Hyperlipidemia, unspecified: Secondary | ICD-10-CM | POA: Diagnosis present

## 2017-06-18 DIAGNOSIS — I352 Nonrheumatic aortic (valve) stenosis with insufficiency: Principal | ICD-10-CM | POA: Diagnosis present

## 2017-06-18 DIAGNOSIS — Z951 Presence of aortocoronary bypass graft: Secondary | ICD-10-CM | POA: Diagnosis not present

## 2017-06-18 DIAGNOSIS — Z006 Encounter for examination for normal comparison and control in clinical research program: Secondary | ICD-10-CM

## 2017-06-18 DIAGNOSIS — Z79899 Other long term (current) drug therapy: Secondary | ICD-10-CM

## 2017-06-18 DIAGNOSIS — I119 Hypertensive heart disease without heart failure: Secondary | ICD-10-CM | POA: Diagnosis present

## 2017-06-18 DIAGNOSIS — I350 Nonrheumatic aortic (valve) stenosis: Secondary | ICD-10-CM

## 2017-06-18 DIAGNOSIS — Z8249 Family history of ischemic heart disease and other diseases of the circulatory system: Secondary | ICD-10-CM

## 2017-06-18 DIAGNOSIS — Z952 Presence of prosthetic heart valve: Secondary | ICD-10-CM | POA: Diagnosis not present

## 2017-06-18 DIAGNOSIS — J9811 Atelectasis: Secondary | ICD-10-CM

## 2017-06-18 DIAGNOSIS — Z954 Presence of other heart-valve replacement: Secondary | ICD-10-CM | POA: Diagnosis not present

## 2017-06-18 DIAGNOSIS — I2510 Atherosclerotic heart disease of native coronary artery without angina pectoris: Secondary | ICD-10-CM | POA: Diagnosis present

## 2017-06-18 HISTORY — DX: Presence of prosthetic heart valve: Z95.2

## 2017-06-18 HISTORY — PX: TRANSCATHETER AORTIC VALVE REPLACEMENT, TRANSFEMORAL: SHX6400

## 2017-06-18 HISTORY — PX: TEE WITHOUT CARDIOVERSION: SHX5443

## 2017-06-18 LAB — CBC
HEMATOCRIT: 35.8 % — AB (ref 39.0–52.0)
Hemoglobin: 11.7 g/dL — ABNORMAL LOW (ref 13.0–17.0)
MCH: 29.5 pg (ref 26.0–34.0)
MCHC: 32.7 g/dL (ref 30.0–36.0)
MCV: 90.2 fL (ref 78.0–100.0)
Platelets: 98 10*3/uL — ABNORMAL LOW (ref 150–400)
RBC: 3.97 MIL/uL — AB (ref 4.22–5.81)
RDW: 14.2 % (ref 11.5–15.5)
WBC: 4.3 10*3/uL (ref 4.0–10.5)

## 2017-06-18 LAB — PROTIME-INR
INR: 1.28
INR: 1.46
Prothrombin Time: 16.1 seconds — ABNORMAL HIGH (ref 11.4–15.2)
Prothrombin Time: 17.9 seconds — ABNORMAL HIGH (ref 11.4–15.2)

## 2017-06-18 LAB — APTT
APTT: 38 s — AB (ref 24–36)
aPTT: 37 seconds — ABNORMAL HIGH (ref 24–36)

## 2017-06-18 SURGERY — IMPLANTATION, AORTIC VALVE, TRANSCATHETER, FEMORAL APPROACH
Anesthesia: Monitor Anesthesia Care | Site: Chest

## 2017-06-18 MED ORDER — SODIUM CHLORIDE 0.9 % IV SOLN: 1.0000 mL/kg/h | INTRAVENOUS | Status: AC

## 2017-06-18 MED ORDER — CHLORHEXIDINE GLUCONATE 4 % EX LIQD: 60.0000 mL | Freq: Once | CUTANEOUS | Status: DC

## 2017-06-18 MED ORDER — POTASSIUM CHLORIDE 10 MEQ/50ML IV SOLN: 10.0000 meq | INTRAVENOUS | Status: AC

## 2017-06-18 MED ORDER — SODIUM CHLORIDE 0.9 % IV SOLN: INTRAVENOUS | Status: DC

## 2017-06-18 MED ORDER — LIDOCAINE HCL 1 % IJ SOLN
INTRAMUSCULAR | Status: DC | PRN
  Administered 2017-06-18: 10 mL

## 2017-06-18 MED ORDER — PANTOPRAZOLE SODIUM 40 MG PO TBEC: 40.0000 mg | DELAYED_RELEASE_TABLET | Freq: Every day | ORAL | Status: DC

## 2017-06-18 MED ORDER — FENTANYL CITRATE (PF) 100 MCG/2ML IJ SOLN
100.0000 ug | Freq: Once | INTRAMUSCULAR | Status: AC
  Administered 2017-06-18: 50 ug via INTRAVENOUS

## 2017-06-18 MED ORDER — ALBUMIN HUMAN 5 % IV SOLN: 250.0000 mL | INTRAVENOUS | Status: AC | PRN

## 2017-06-18 MED ORDER — 0.9 % SODIUM CHLORIDE (POUR BTL) OPTIME
TOPICAL | Status: DC | PRN
  Administered 2017-06-18: 3000 mL

## 2017-06-18 MED ORDER — ASPIRIN 81 MG PO CHEW: 324.0000 mg | CHEWABLE_TABLET | Freq: Every day | ORAL | Status: DC

## 2017-06-18 MED ORDER — CHLORHEXIDINE GLUCONATE 0.12 % MT SOLN
15.0000 mL | OROMUCOSAL | Status: AC
  Administered 2017-06-18: 15 mL via OROMUCOSAL
  Filled 2017-06-18: qty 15

## 2017-06-18 MED ORDER — DEXAMETHASONE SODIUM PHOSPHATE 10 MG/ML IJ SOLN
INTRAMUSCULAR | Status: DC | PRN
  Administered 2017-06-18: 10 mg via INTRAVENOUS

## 2017-06-18 MED ORDER — PROTAMINE SULFATE 10 MG/ML IV SOLN
INTRAVENOUS | Status: DC | PRN
  Administered 2017-06-18: 60 mg via INTRAVENOUS

## 2017-06-18 MED ORDER — DEXTROSE 5 % IV SOLN
INTRAVENOUS | Status: DC | PRN
  Administered 2017-06-18: 1 ug/min via INTRAVENOUS

## 2017-06-18 MED ORDER — MIDAZOLAM HCL 2 MG/2ML IJ SOLN
0.5000 mg | Freq: Once | INTRAMUSCULAR | Status: AC
  Administered 2017-06-18: 0.5 mg via INTRAVENOUS

## 2017-06-18 MED ORDER — ONDANSETRON HCL 4 MG/2ML IJ SOLN
INTRAMUSCULAR | Status: DC | PRN
  Administered 2017-06-18: 4 mg via INTRAVENOUS

## 2017-06-18 MED ORDER — LACTATED RINGERS IV SOLN: 500.0000 mL | Freq: Once | INTRAVENOUS | Status: DC | PRN

## 2017-06-18 MED ORDER — TRAMADOL HCL 50 MG PO TABS: 50.0000 mg | ORAL_TABLET | ORAL | Status: DC | PRN

## 2017-06-18 MED ORDER — MIDAZOLAM HCL 2 MG/2ML IJ SOLN: 2.0000 mg | INTRAMUSCULAR | Status: DC | PRN

## 2017-06-18 MED ORDER — DEXTROSE 5 % IV SOLN
INTRAVENOUS | Status: DC | PRN
  Administered 2017-06-18: 1.5 g via INTRAVENOUS

## 2017-06-18 MED ORDER — HEPARIN SODIUM (PORCINE) 1000 UNIT/ML IJ SOLN
INTRAMUSCULAR | Status: DC | PRN
  Administered 2017-06-18: 6000 [IU] via INTRAVENOUS

## 2017-06-18 MED ORDER — NITROGLYCERIN 0.2 MG/ML ON CALL CATH LAB
INTRAVENOUS | Status: DC | PRN
  Administered 2017-06-18: 20 ug via INTRAVENOUS

## 2017-06-18 MED ORDER — SODIUM CHLORIDE 0.9 % IV SOLN
INTRAVENOUS | Status: DC | PRN
  Administered 2017-06-18: 1250 mg via INTRAVENOUS

## 2017-06-18 MED ORDER — FENTANYL CITRATE (PF) 100 MCG/2ML IJ SOLN
INTRAMUSCULAR | Status: AC
  Administered 2017-06-18: 50 ug via INTRAVENOUS
  Filled 2017-06-18: qty 2

## 2017-06-18 MED ORDER — CHLORHEXIDINE GLUCONATE 0.12 % MT SOLN
15.0000 mL | Freq: Once | OROMUCOSAL | Status: AC
  Administered 2017-06-18: 15 mL via OROMUCOSAL
  Filled 2017-06-18: qty 15

## 2017-06-18 MED ORDER — CHLORHEXIDINE GLUCONATE 4 % EX LIQD: 30.0000 mL | CUTANEOUS | Status: DC

## 2017-06-18 MED ORDER — LACTATED RINGERS IV SOLN
INTRAVENOUS | Status: DC
  Administered 2017-06-18: 10:00:00 via INTRAVENOUS

## 2017-06-18 MED ORDER — NITROGLYCERIN IN D5W 200-5 MCG/ML-% IV SOLN: 0.0000 ug/min | INTRAVENOUS | Status: DC

## 2017-06-18 MED ORDER — IODIXANOL 320 MG/ML IV SOLN
INTRAVENOUS | Status: DC | PRN
  Administered 2017-06-18: 60.9 mL via INTRAVENOUS

## 2017-06-18 MED ORDER — ASPIRIN EC 325 MG PO TBEC: 325.0000 mg | DELAYED_RELEASE_TABLET | Freq: Every day | ORAL | Status: DC

## 2017-06-18 MED ORDER — SODIUM CHLORIDE 0.9 % IV SOLN
1.0000 mL/kg/h | INTRAVENOUS | Status: DC
  Administered 2017-06-18: 1 mL/kg/h via INTRAVENOUS

## 2017-06-18 MED ORDER — LIDOCAINE 2% (20 MG/ML) 5 ML SYRINGE
INTRAMUSCULAR | Status: DC | PRN
  Administered 2017-06-18: 50 mg via INTRAVENOUS

## 2017-06-18 MED ORDER — DEXMEDETOMIDINE HCL 200 MCG/2ML IV SOLN
INTRAVENOUS | Status: DC | PRN
  Administered 2017-06-18: 120 ug via INTRAVENOUS

## 2017-06-18 MED ORDER — PROPOFOL 1000 MG/100ML IV EMUL
INTRAVENOUS | Status: AC
  Filled 2017-06-18: qty 100

## 2017-06-18 MED ORDER — METOPROLOL TARTRATE 5 MG/5ML IV SOLN
2.5000 mg | INTRAVENOUS | Status: DC | PRN
  Administered 2017-06-19: 5 mg via INTRAVENOUS
  Filled 2017-06-18: qty 5

## 2017-06-18 MED ORDER — MIDAZOLAM HCL 2 MG/2ML IJ SOLN
INTRAMUSCULAR | Status: AC
  Filled 2017-06-18: qty 2

## 2017-06-18 MED ORDER — MORPHINE SULFATE (PF) 4 MG/ML IV SOLN: 1.0000 mg | INTRAVENOUS | Status: DC | PRN

## 2017-06-18 MED ORDER — LIDOCAINE HCL (PF) 1 % IJ SOLN
INTRAMUSCULAR | Status: AC
  Filled 2017-06-18: qty 30

## 2017-06-18 MED ORDER — PHENYLEPHRINE HCL 10 MG/ML IJ SOLN: 0.0000 ug/min | INTRAMUSCULAR | Status: DC

## 2017-06-18 MED ORDER — VANCOMYCIN HCL IN DEXTROSE 1-5 GM/200ML-% IV SOLN
1000.0000 mg | Freq: Once | INTRAVENOUS | Status: AC
  Administered 2017-06-18: 1000 mg via INTRAVENOUS
  Filled 2017-06-18: qty 200

## 2017-06-18 MED ORDER — FENTANYL CITRATE (PF) 250 MCG/5ML IJ SOLN
INTRAMUSCULAR | Status: AC
  Filled 2017-06-18: qty 5

## 2017-06-18 MED ORDER — LIDOCAINE 2% (20 MG/ML) 5 ML SYRINGE
INTRAMUSCULAR | Status: AC
  Filled 2017-06-18: qty 5

## 2017-06-18 MED ORDER — OXYCODONE HCL 5 MG PO TABS: 5.0000 mg | ORAL_TABLET | ORAL | Status: DC | PRN

## 2017-06-18 MED ORDER — FAMOTIDINE IN NACL 20-0.9 MG/50ML-% IV SOLN
20.0000 mg | Freq: Two times a day (BID) | INTRAVENOUS | Status: AC
  Administered 2017-06-18 (×2): 20 mg via INTRAVENOUS
  Filled 2017-06-18 (×2): qty 50

## 2017-06-18 MED ORDER — ONDANSETRON HCL 4 MG/2ML IJ SOLN: 4.0000 mg | Freq: Four times a day (QID) | INTRAMUSCULAR | Status: DC | PRN

## 2017-06-18 MED ORDER — DEXTROSE 5 % IV SOLN
1.5000 g | Freq: Two times a day (BID) | INTRAVENOUS | Status: DC
  Administered 2017-06-18 – 2017-06-19 (×2): 1.5 g via INTRAVENOUS
  Filled 2017-06-18 (×3): qty 1.5

## 2017-06-18 SURGICAL SUPPLY — 113 items
ADAPTER UNIV SWAN GANZ BIP (ADAPTER) ×2 IMPLANT
ADAPTER UNV SWAN GANZ BIP (ADAPTER) ×2
ADH SKN CLS APL DERMABOND .7 (GAUZE/BANDAGES/DRESSINGS) ×2
ADPR CATH UNV NS SG CATH (ADAPTER) ×2
ATTRACTOMAT 16X20 MAGNETIC DRP (DRAPES) IMPLANT
BAG BANDED W/RUBBER/TAPE 36X54 (MISCELLANEOUS) ×4 IMPLANT
BAG DECANTER FOR FLEXI CONT (MISCELLANEOUS) IMPLANT
BAG EQP BAND 135X91 W/RBR TAPE (MISCELLANEOUS) ×2
BAG SNAP BAND KOVER 36X36 (MISCELLANEOUS) ×8 IMPLANT
BLADE 10 SAFETY STRL DISP (BLADE) ×2 IMPLANT
BLADE CLIPPER SURG (BLADE) IMPLANT
BLADE STERNUM SYSTEM 6 (BLADE) ×4 IMPLANT
CABLE ADAPT CONN TEMP 6FT (ADAPTER) ×4 IMPLANT
CABLE PACING FASLOC BIEGE (MISCELLANEOUS) ×2 IMPLANT
CABLE PACING FASLOC BLUE (MISCELLANEOUS) ×4 IMPLANT
CANISTER SUCT 3000ML PPV (MISCELLANEOUS) IMPLANT
CANNULA FEM VENOUS REMOTE 22FR (CANNULA) IMPLANT
CANNULA OPTISITE PERFUSION 16F (CANNULA) IMPLANT
CANNULA OPTISITE PERFUSION 18F (CANNULA) IMPLANT
CATH DIAG EXPO 6F VENT PIG 145 (CATHETERS) ×8 IMPLANT
CATH EXPO 5FR AL1 (CATHETERS) ×4 IMPLANT
CATH S G BIP PACING (SET/KITS/TRAYS/PACK) ×8 IMPLANT
CLIP VESOCCLUDE MED 24/CT (CLIP) ×2 IMPLANT
CLIP VESOCCLUDE SM WIDE 24/CT (CLIP) ×2 IMPLANT
CONT SPEC 4OZ CLIKSEAL STRL BL (MISCELLANEOUS) ×10 IMPLANT
COVER BACK TABLE 60X90IN (DRAPES) ×4 IMPLANT
COVER BACK TABLE 80X110 HD (DRAPES) ×4 IMPLANT
COVER DOME SNAP 22 D (MISCELLANEOUS) ×4 IMPLANT
COVER MAYO STAND STRL (DRAPES) ×2 IMPLANT
CRADLE DONUT ADULT HEAD (MISCELLANEOUS) ×4 IMPLANT
DECANTER SPIKE VIAL GLASS SM (MISCELLANEOUS) ×2 IMPLANT
DERMABOND ADVANCED (GAUZE/BANDAGES/DRESSINGS) ×2
DERMABOND ADVANCED .7 DNX12 (GAUZE/BANDAGES/DRESSINGS) ×2 IMPLANT
DEVICE CLOSURE PERCLS PRGLD 6F (VASCULAR PRODUCTS) IMPLANT
DRAPE INCISE IOBAN 66X45 STRL (DRAPES) IMPLANT
DRAPE SLUSH MACHINE 52X66 (DRAPES) ×4 IMPLANT
DRSG TEGADERM 4X4.75 (GAUZE/BANDAGES/DRESSINGS) ×8 IMPLANT
ELECT REM PT RETURN 9FT ADLT (ELECTROSURGICAL) ×8
ELECTRODE REM PT RTRN 9FT ADLT (ELECTROSURGICAL) ×4 IMPLANT
FELT TEFLON 6X6 (MISCELLANEOUS) ×2 IMPLANT
FEMORAL VENOUS CANN RAP (CANNULA) IMPLANT
GAUZE SPONGE 4X4 12PLY STRL (GAUZE/BANDAGES/DRESSINGS) ×4 IMPLANT
GLOVE BIO SURGEON STRL SZ 6.5 (GLOVE) ×2 IMPLANT
GLOVE BIO SURGEON STRL SZ7.5 (GLOVE) ×6 IMPLANT
GLOVE BIO SURGEON STRL SZ8 (GLOVE) ×6 IMPLANT
GLOVE BIO SURGEONS STRL SZ 6.5 (GLOVE) ×2
GLOVE BIOGEL PI IND STRL 6.5 (GLOVE) IMPLANT
GLOVE BIOGEL PI IND STRL 8 (GLOVE) IMPLANT
GLOVE BIOGEL PI INDICATOR 6.5 (GLOVE) ×8
GLOVE BIOGEL PI INDICATOR 8 (GLOVE) ×2
GLOVE EUDERMIC 7 POWDERFREE (GLOVE) ×2 IMPLANT
GLOVE ORTHO TXT STRL SZ7.5 (GLOVE) ×4 IMPLANT
GOWN STRL REUS W/ TWL LRG LVL3 (GOWN DISPOSABLE) ×6 IMPLANT
GOWN STRL REUS W/ TWL XL LVL3 (GOWN DISPOSABLE) ×12 IMPLANT
GOWN STRL REUS W/TWL LRG LVL3 (GOWN DISPOSABLE) ×12
GOWN STRL REUS W/TWL XL LVL3 (GOWN DISPOSABLE) ×24
GUIDEWIRE SAF TJ AMPL .035X180 (WIRE) ×4 IMPLANT
GUIDEWIRE SAFE TJ AMPLATZ EXST (WIRE) ×4 IMPLANT
GUIDEWIRE STRAIGHT .035 260CM (WIRE) ×4 IMPLANT
INSERT FOGARTY 61MM (MISCELLANEOUS) ×2 IMPLANT
INSERT FOGARTY SM (MISCELLANEOUS) IMPLANT
INSERT FOGARTY XLG (MISCELLANEOUS) IMPLANT
KIT BASIN OR (CUSTOM PROCEDURE TRAY) ×4 IMPLANT
KIT DILATOR VASC 18G NDL (KITS) IMPLANT
KIT HEART LEFT (KITS) ×4 IMPLANT
KIT ROOM TURNOVER OR (KITS) ×4 IMPLANT
KIT SUCTION CATH 14FR (SUCTIONS) ×4 IMPLANT
NDL HYPO 25GX1X1/2 BEV (NEEDLE) IMPLANT
NDL PERC 18GX7CM (NEEDLE) ×2 IMPLANT
NEEDLE HYPO 25GX1X1/2 BEV (NEEDLE) ×4 IMPLANT
NEEDLE PERC 18GX7CM (NEEDLE) ×4 IMPLANT
NS IRRIG 1000ML POUR BTL (IV SOLUTION) ×14 IMPLANT
PACK AORTA (CUSTOM PROCEDURE TRAY) ×4 IMPLANT
PAD ARMBOARD 7.5X6 YLW CONV (MISCELLANEOUS) ×8 IMPLANT
PAD ELECT DEFIB RADIOL ZOLL (MISCELLANEOUS) ×4 IMPLANT
PATCH TACHOSII LRG 9.5X4.8 (VASCULAR PRODUCTS) IMPLANT
PERCLOSE PROGLIDE 6F (VASCULAR PRODUCTS) ×8
SET MICROPUNCTURE 5F STIFF (MISCELLANEOUS) ×4 IMPLANT
SHEATH AVANTI 11CM 8FR (MISCELLANEOUS) ×4 IMPLANT
SHEATH PINNACLE 6F 10CM (SHEATH) ×8 IMPLANT
SLEEVE REPOSITIONING LENGTH 30 (MISCELLANEOUS) ×4 IMPLANT
SPONGE LAP 4X18 X RAY DECT (DISPOSABLE) ×2 IMPLANT
STOPCOCK MORSE 400PSI 3WAY (MISCELLANEOUS) ×18 IMPLANT
SUT ETHIBOND X763 2 0 SH 1 (SUTURE) IMPLANT
SUT GORETEX CV 4 TH 22 36 (SUTURE) IMPLANT
SUT GORETEX CV4 TH-18 (SUTURE) IMPLANT
SUT GORETEX TH-18 36 INCH (SUTURE) IMPLANT
SUT MNCRL AB 3-0 PS2 18 (SUTURE) IMPLANT
SUT PROLENE 3 0 SH1 36 (SUTURE) IMPLANT
SUT PROLENE 4 0 RB 1 (SUTURE)
SUT PROLENE 4-0 RB1 .5 CRCL 36 (SUTURE) IMPLANT
SUT PROLENE 5 0 C 1 36 (SUTURE) IMPLANT
SUT PROLENE 6 0 C 1 30 (SUTURE) IMPLANT
SUT SILK  1 MH (SUTURE) ×2
SUT SILK 1 MH (SUTURE) ×2 IMPLANT
SUT SILK 2 0 SH CR/8 (SUTURE) IMPLANT
SUT VIC AB 2-0 CT1 27 (SUTURE)
SUT VIC AB 2-0 CT1 TAPERPNT 27 (SUTURE) IMPLANT
SUT VIC AB 2-0 CTX 36 (SUTURE) IMPLANT
SUT VIC AB 3-0 SH 8-18 (SUTURE) IMPLANT
SYR 10ML LL (SYRINGE) ×12 IMPLANT
SYR 30ML LL (SYRINGE) ×8 IMPLANT
SYR 50ML LL SCALE MARK (SYRINGE) ×4 IMPLANT
SYR CONTROL 10ML LL (SYRINGE) ×2 IMPLANT
TOWEL OR 17X26 10 PK STRL BLUE (TOWEL DISPOSABLE) ×8 IMPLANT
TRANSDUCER W/STOPCOCK (MISCELLANEOUS) ×8 IMPLANT
TRAY FOLEY SILVER 14FR TEMP (SET/KITS/TRAYS/PACK) ×2 IMPLANT
TUBE SUCT INTRACARD DLP 20F (MISCELLANEOUS) IMPLANT
TUBING HIGH PRESSURE 120CM (CONNECTOR) ×4 IMPLANT
VALVE HEART TRANSCATH SZ3 26MM (Prosthesis & Implant Heart) ×2 IMPLANT
WIRE .035 3MM-J 145CM (WIRE) ×2 IMPLANT
WIRE AMPLATZ SS-J .035X180CM (WIRE) ×4 IMPLANT
WIRE BENTSON .035X145CM (WIRE) ×4 IMPLANT

## 2017-06-18 NOTE — Anesthesia Procedure Notes (Signed)
Arterial Line Insertion Start/End7/31/2018 9:44 AM Performed by: Dorie RankQUINN, Anyah Swallow M, CRNA  Patient location: Pre-op. Preanesthetic checklist: patient identified, IV checked, risks and benefits discussed, surgical consent, monitors and equipment checked, pre-op evaluation and timeout performed Lidocaine 1% used for infiltration Right, radial was placed Catheter size: 20 G Hand hygiene performed , maximum sterile barriers used  and Seldinger technique used Allen's test indicative of satisfactory collateral circulation Attempts: 1 Following insertion, Biopatch and dressing applied. Post procedure assessment: normal  Patient tolerated the procedure well with no immediate complications.

## 2017-06-18 NOTE — Anesthesia Procedure Notes (Signed)
Central Venous Catheter Insertion Performed by: Annye Asa, anesthesiologist Start/End7/31/2018 9:16 AM, 06/18/2017 9:27 AM Preanesthetic checklist: patient identified, IV checked, site marked, risks and benefits discussed, surgical consent, monitors and equipment checked, pre-op evaluation, timeout performed and anesthesia consent Position: supine Lidocaine 1% used for infiltration and patient sedated Hand hygiene performed , maximum sterile barriers used  and Seldinger technique used Catheter size: 8 Fr Central line was placed.Double lumen Procedure performed using ultrasound guided technique. Ultrasound Notes:anatomy identified, needle tip was noted to be adjacent to the nerve/plexus identified, no ultrasound evidence of intravascular and/or intraneural injection and image(s) printed for medical record Attempts: 1 Following insertion, line sutured, dressing applied and Biopatch. Post procedure assessment: blood return through all ports, free fluid flow and no air  Patient tolerated the procedure well with no immediate complications. Additional procedure comments: CVP: Timeout, sterile prep, drape, FBP R neck.  Supine position.  1% lido local, finder and trocar RIJ 1st pass with US guidance.  2 lumen placed over J wire. Biopatch and sterile dressing on.  Patient tolerated well.  VSS.  Jenita Seashore, MD.

## 2017-06-18 NOTE — Anesthesia Preprocedure Evaluation (Addendum)
Anesthesia Evaluation  Patient identified by MRN, date of birth, ID band Patient awake    Reviewed: Allergy & Precautions, NPO status , Patient's Chart, lab work & pertinent test results, reviewed documented beta blocker date and time   History of Anesthesia Complications Negative for: history of anesthetic complications  Airway Mallampati: II  TM Distance: >3 FB Neck ROM: Full    Dental  (+) Dental Advisory Given   Pulmonary neg pulmonary ROS,    breath sounds clear to auscultation       Cardiovascular hypertension, Pt. on home beta blockers and Pt. on medications (-) angina+ CAD and + CABG  + dysrhythmias (cardioverted) Atrial Fibrillation + Valvular Problems/Murmurs AS  Rhythm:Regular Rate:Normal  6/18 cath: Grafts patent, Severe aortic valve stenosis with a peak to peak (not PI)  gradient of 50 mmHg, mean  52 mm Hg, and aortic valve area of 0.4 cm.   Neuro/Psych negative neurological ROS     GI/Hepatic negative GI ROS, Neg liver ROS,   Endo/Other  negative endocrine ROS  Renal/GU negative Renal ROS     Musculoskeletal  (+) Arthritis , Osteoarthritis,    Abdominal   Peds  Hematology  (+) Blood dyscrasia (xarelto), ,   Anesthesia Other Findings   Reproductive/Obstetrics                            Anesthesia Physical Anesthesia Plan  ASA: III  Anesthesia Plan: MAC   Post-op Pain Management:    Induction:   PONV Risk Score and Plan: 1 and Ondansetron and Dexamethasone  Airway Management Planned: Natural Airway and Simple Face Mask  Additional Equipment: Arterial line and CVP  Intra-op Plan:   Post-operative Plan:   Informed Consent: I have reviewed the patients History and Physical, chart, labs and discussed the procedure including the risks, benefits and alternatives for the proposed anesthesia with the patient or authorized representative who has indicated his/her  understanding and acceptance.   Dental advisory given  Plan Discussed with: CRNA and Surgeon  Anesthesia Plan Comments: (Plan routine monitors, A-line, CVP, MAC with cardiology performing TTE)        Anesthesia Quick Evaluation

## 2017-06-18 NOTE — Telephone Encounter (Signed)
New message    TOC appt made with Tereso NewcomerScott Weaver on 06/28/17 at 845am per Carlean JewsKatie Thompson. Dr. Donnie Ahoilley pt. Post TAVR

## 2017-06-18 NOTE — Progress Notes (Signed)
6 french sheath removed from left femoral artery and 6 french sheath removed from left femoral vein.  Pressure held x 20 minutes.  Pressure dressing applied to site.  Site looks good with no hematoma.  RN will continue to monitor.

## 2017-06-18 NOTE — Anesthesia Postprocedure Evaluation (Signed)
Anesthesia Post Note  Patient: Thomas Diaz  Procedure(s) Performed: Procedure(s) (LRB): TRANSCATHETER AORTIC VALVE REPLACEMENT, TRANSFEMORAL using an Edwards 26 mm Sapien 3 Aortic Valve (N/A) TRANSESOPHAGEAL ECHOCARDIOGRAM (TEE) (N/A)     Patient location during evaluation: PACU Anesthesia Type: MAC Level of consciousness: awake and alert, patient cooperative and oriented Pain management: pain level controlled Vital Signs Assessment: post-procedure vital signs reviewed and stable Respiratory status: spontaneous breathing, nonlabored ventilation, respiratory function stable and patient connected to nasal cannula oxygen Cardiovascular status: blood pressure returned to baseline and stable Postop Assessment: no signs of nausea or vomiting Anesthetic complications: no    Last Vitals:  Vitals:   06/18/17 1530 06/18/17 1545  BP:    Pulse: (!) 44 (!) 42  Resp: 17 16  Temp:      Last Pain:  Vitals:   06/18/17 1345  TempSrc: Axillary                 Cashawn Yanko,E. Riann Oman

## 2017-06-18 NOTE — Op Note (Signed)
[facility]   MULTIDISCIPLINARY HEART VALVE TEAM   TAVR OPERATIVE NOTE   Date of Procedure:  06/18/2017  Preoperative Diagnosis: Severe Aortic Stenosis   Postoperative Diagnosis: Same   Procedure:    Transcatheter Aortic Valve Replacement - Percutaneous Right Transfemoral Approach  Edwards Sapien 3 THV (size 26 mm, model # 9600TFX, serial # [id number])   Co-Surgeons:  Salvatore Decentlarence H. Cornelius Moraswen, MD and Tonny BollmanMichael Cooper, MD  Anesthesiologist:  Germaine PomfretE. Carswell Jackson, MD  Echocardiographer:  Charlton HawsPeter Nishan, MD  Pre-operative Echo Findings:  Severe aortic stenosis  Normal left ventricular systolic function  Post-operative Echo Findings:  Trivial paravalvular leak  Normal left ventricular systolic function   BRIEF CLINICAL NOTE AND INDICATIONS FOR SURGERY  Patient is an 83 year old male with history of aortic stenosis, coronary artery disease status post coronary artery bypass grafting in 2009, hypertension, hyperlipidemia, and persistent atrial fibrillation currently maintaining sinus rhythm since DC cardioversion on amiodarone and Xarelto for long-term anticoagulation who has been referred for a second surgical opinion to discuss treatment options for management of severe symptomatic aortic stenosis. The patient has known of presence of a heart murmur since childhood. He was followed for many years by Dr. Patty SermonsBrackbill and remain physically active and functionally independent all of his adult life. In 2009 he presented with symptoms of chest discomfort and was found to have three-vessel coronary artery disease. He underwent coronary artery bypass grafting 5 by Dr. Tyrone SageGerhardt at that time. She did well clinically and has continued to do well until recently. For the last several years he has been followed by Dr. Donnie Ahoilley, and transthoracic echocardiograms have demonstrated the development of severe aortic stenosis. The patient remains fairly active physically although he does experience some  mild exertional shortness of breath and decreased energy. He states that he has slowed down a bit over the past year or 2. His wife suffers from Alzheimer's and has been in declining health for some time now. The patient states that he has become more fatigued and a little bit more unsteady on his feet. Recent follow-up transthoracic echocardiogram revealed the presence of severe aortic stenosis with peak velocity across the aortic valve measured greater than 4.1 m/s corresponding to mean transvalvular gradient estimated 41 mmHg. Left ventricular systolic function remains normal. The patient underwent diagnostic cardiac catheterization and was found to have continued patency of all 5 bypass grafts placed at the time of his surgery in 2009. He has severe native coronary artery disease. The patient was referred for surgical consultation and has been evaluated previously by Dr. Tyrone SageGerhardt felt the patient would be at relatively high risk for conventional surgical aortic valve replacement via redo sternotomy. Patient underwent CT angiography and has been referred for a second surgical opinion.  The patient has been seen in consultation and counseled at length regarding the indications, risks and potential benefits of surgery.  All questions have been answered, and the patient provides full informed consent for the operation as described.   During the course of the patient's preoperative work up they have been evaluated comprehensively by a multidisciplinary team of specialists coordinated through the [facility].  They have been demonstrated to suffer from symptomatic severe aortic stenosis as noted above. The patient has been counseled extensively as to the relative risks and benefits of all options for the treatment of severe aortic stenosis including long term medical therapy, conventional surgery for aortic valve replacement, and transcatheter  aortic valve  replacement.  All questions have been answered, and the patient provides full informed consent for the operation as described.   DETAILS OF THE OPERATIVE PROCEDURE  PREPARATION:    The patient is brought to the operating room on the above mentioned date and central monitoring was established by the anesthesia team including placement of a central venous line and radial arterial line. The patient is placed in the supine position on the operating table.  Intravenous antibiotics are administered. The patient is monitored closely throughout the procedure under conscious sedation.    Baseline transthoracic echocardiogram was performed. The patient's chest, abdomen, both groins, and both lower extremities are prepared and draped in a sterile manner. A time out procedure is performed.   PERIPHERAL ACCESS:    Using the modified Seldinger technique, femoral arterial and venous access was obtained with placement of 6 Fr sheaths on the left side.  A pigtail diagnostic catheter was passed through the left arterial sheath under fluoroscopic guidance into the aortic root.  A temporary transvenous pacemaker catheter was passed through the left femoral venous sheath under fluoroscopic guidance into the right ventricle.  The pacemaker was tested to ensure stable lead placement and pacemaker capture. Aortic root angiography was performed in order to determine the optimal angiographic angle for valve deployment.   TRANSFEMORAL ACCESS:   Percutaneous transfemoral access and sheath placement was performed using ultrasound guidance.  The right common femoral artery was cannulated using a micropuncture needle and appropriate location was verified using hand injection angiogram.  A pair of Abbott Perclose percutaneous closure devices were placed and a 6 French sheath replaced into the femoral artery.  The patient was heparinized systemically and ACT verified > 250 seconds.    A 14 Fr transfemoral E-sheath  was introduced into the right femoral artery after progressively dilating over an Amplatz superstiff wire. An AL-2 catheter was used to direct a straight-tip exchange length wire across the native aortic valve into the left ventricle. This was exchanged out for a pigtail catheter and position was confirmed in the LV apex. Simultaneous LV and Ao pressures were recorded.  The pigtail catheter was exchanged for an Amplatz Extra-stiff wire in the LV apex.  Echocardiography was utilized to confirm appropriate wire position and no sign of entanglement in the mitral subvalvular apparatus.   TRANSCATHETER HEART VALVE DEPLOYMENT:   An Edwards Sapien 3 transcatheter heart valve (size 26 mm, model #9600TFX, serial #[id number]) was prepared and crimped per manufacturer's guidelines, and the proper orientation of the valve is confirmed on the [employer] Commander delivery system. The valve was advanced through the introducer sheath using normal technique until in an appropriate position in the abdominal aorta beyond the sheath tip. The balloon was then retracted and using the fine-tuning wheel was centered on the valve. The valve was then advanced across the aortic arch using appropriate flexion of the catheter. The valve was carefully positioned across the aortic valve annulus. The Commander catheter was retracted using normal technique. Once final position of the valve has been confirmed by angiographic assessment, the valve is deployed while temporarily holding ventilation and during rapid ventricular pacing to maintain systolic blood pressure < 50 mmHg and pulse pressure < 10 mmHg. The balloon inflation is held for >3 seconds after reaching full deployment volume. Once the balloon has fully deflated the balloon is retracted into the ascending aorta and valve function is assessed using echocardiography. There is felt to be trivial paravalvular leak and no central aortic insufficiency.  The patient's  hemodynamic recovery  following valve deployment is good.  The deployment balloon and guidewire are both removed.    PROCEDURE COMPLETION:   The sheath was removed and femoral artery closure performed uneventfully.  Protamine was administered once femoral arterial repair was complete. The temporary pacemaker, pigtail catheters and femoral sheaths were removed with manual pressure used for hemostasis.   The patient tolerated the procedure well and is transported to the surgical intensive care in stable condition. There were no immediate intraoperative complications. All sponge instrument and needle counts are verified correct at completion of the operation.   No blood products were administered during the operation.  The patient received a total of 60.9 mL of intravenous contrast during the procedure.   Purcell Nails, MD 06/18/2017 12:30 PM

## 2017-06-18 NOTE — Interval H&P Note (Signed)
History and Physical Interval Note:  06/18/2017 7:59 AM  Candelaria CelesteLester C Ellner  has presented today for surgery, with the diagnosis of SEVERE AS  The various methods of treatment have been discussed with the patient and family. After consideration of risks, benefits and other options for treatment, the patient has consented to  Procedure(s): TRANSCATHETER AORTIC VALVE REPLACEMENT, TRANSFEMORAL (N/A) TRANSESOPHAGEAL ECHOCARDIOGRAM (TEE) (N/A) as a surgical intervention .  The patient's history has been reviewed, patient examined, no change in status, stable for surgery.  I have reviewed the patient's chart and labs.  Questions were answered to the patient's satisfaction.    Pt re-evaluated. Daughter at bedside. No interval change in symptoms. All data reviewed. Plans for percutaneous transfemoral TAVR discussed again with patient and daughter. All questions answered.  Tonny Bollmanooper, Melesa Lecy

## 2017-06-18 NOTE — Op Note (Signed)
[facility]   MULTIDISCIPLINARY HEART VALVE TEAM   TAVR OPERATIVE NOTE   Date of Procedure:  06/18/2017  Preoperative Diagnosis: Severe Aortic Stenosis   Postoperative Diagnosis: Same   Procedure:   Transcatheter Aortic Valve Replacement - Percutaneous Transfemoral Approach  Edwards Sapien 3 THV (size 26 mm, model # 9600TFX, serial # [id number])   Co-Surgeons:  Salvatore Decentlarence H. Cornelius Moraswen, MD and Tonny BollmanMichael Hart Haas, MD  Anesthesiologist:  Germaine PomfretE Carswell Jackson, MD  Echocardiographer:  Charlton HawsPeter Nishan, MD  Pre-operative Echo Findings:  Severe aortic stenosis  Normal left ventricular systolic function  Post-operative Echo Findings:  Trivial paravalvular leak  normal left ventricular systolic function  BRIEF CLINICAL NOTE AND INDICATIONS FOR SURGERY  83 yo male, patient of Dr Donnie Ahoilley, who has had prior CABG, now with progressive and severe aortic stenosis. Cardiac cath has shown patency of his bypass grafts. CTA studies demonstrate anatomy suitable for TAVR via a percutaneous transfemoral approach.   During the course of the patient's preoperative work up they have been evaluated comprehensively by a multidisciplinary team of specialists coordinated through the [facility]isciplinary [facility] Clinic in [facility].  They have been demonstrated to suffer from symptomatic severe aortic stenosis as noted above. The patient has been counseled extensively as to the relative risks and benefits of all options for the treatment of severe aortic stenosis including long term medical therapy, conventional surgery for aortic valve replacement, and transcatheter aortic valve replacement.  The patient has been independently evaluated by two cardiac surgeons including Dr. Cornelius Moraswen and Dr. Tyrone SageGerhardt, and they are felt to be at moderate risk for conventional surgical aortic valve replacement based upon advanced age, frailty, and previous sternotomy/CABG. Based upon review of all of  the patient's preoperative diagnostic tests they are felt to be candidate for transcatheter aortic valve replacement using the transfemoral approach as an alternative to high risk conventional surgery.    Following the decision to proceed with transcatheter aortic valve replacement, a discussion has been held regarding what types of management strategies would be attempted intraoperatively in the event of life-threatening complications, including whether or not the patient would be considered a candidate for the use of cardiopulmonary bypass and/or conversion to open sternotomy for attempted surgical intervention.  The patient has been advised of a variety of complications that might develop peculiar to this approach including but not limited to risks of death, stroke, paravalvular leak, aortic dissection or other major vascular complications, aortic annulus rupture, device embolization, cardiac rupture or perforation, acute myocardial infarction, arrhythmia, heart block or bradycardia requiring permanent pacemaker placement, congestive heart failure, respiratory failure, renal failure, pneumonia, infection, other late complications related to structural valve deterioration or migration, or other complications that might ultimately cause a temporary or permanent loss of functional independence or other long term morbidity.  The patient provides full informed consent for the procedure as described and all questions were answered preoperatively.    DETAILS OF THE OPERATIVE PROCEDURE  PREPARATION:   The patient is brought to the operating room on the above mentioned date and central monitoring was established by the anesthesia team including placement of a radial arterial line. The patient is placed in the supine position on the operating table.  Intravenous antibiotics are administered.  The patient is monitored closely throughout the procedure under conscious sedation.   Baseline transthoracic  echocardiogram was performed. The patient's chest, abdomen, both groins, and both lower extremities are prepared and draped in a sterile manner. A time  out procedure is performed.   PERIPHERAL ACCESS:   Using ultrasound guidance, femoral arterial and venous access was obtained with placement of 6 Fr sheaths on the left side.  A pigtail diagnostic catheter was passed through the left femoral arterial sheath under fluoroscopic guidance into the aortic root.  A temporary transvenous pacemaker catheter was passed through the left femoral venous sheath under fluoroscopic guidance into the right ventricle.  The pacemaker was tested to ensure stable lead placement and pacemaker capture. Aortic root angiography was performed in order to determine the optimal angiographic angle for valve deployment.   TRANSFEMORAL ACCESS:  A micropuncture technique is used to access the right femoral artery under fluoroscopic and ultrasound guidance.  2 Perclose devices are deployed at 10' and 2' positions to 'PreClose' the femoral artery. An 8 French sheath is placed and then an Amplatz Superstiff wire is advanced through the sheath. This is changed out for a 14 French transfemoral E-Sheath after progressively dilating over the Superstiff wire.  An AL-2 catheter was used to direct a straight-tip exchange length wire across the native aortic valve into the left ventricle. This was exchanged out for a pigtail catheter and position was confirmed in the LV apex. Simultaneous LV and Ao pressures were recorded.  The pigtail catheter was exchanged for an Amplatz Extra-stiff wire in the LV apex.    TRANSCATHETER HEART VALVE DEPLOYMENT:  An Edwards Sapien 3 transcatheter heart valve (size 26 mm, model #9600TFX, serial #[id number]) was prepared and crimped per manufacturer's guidelines, and the proper orientation of the valve is confirmed on the [employer] Commander delivery system. The valve was advanced through the introducer sheath using  normal technique until in an appropriate position in the abdominal aorta beyond the sheath tip. The balloon was then retracted and using the fine-tuning wheel was centered on the valve. The valve was then advanced across the aortic arch using appropriate flexion of the catheter. The valve was carefully positioned across the aortic valve annulus. The Commander catheter was retracted using normal technique. Once final position of the valve has been confirmed by angiographic assessment, the valve is deployed while temporarily holding ventilation and during rapid ventricular pacing to maintain systolic blood pressure < 50 mmHg and pulse pressure < 10 mmHg. The balloon inflation is held for >3 seconds after reaching full deployment volume. Once the balloon has fully deflated the balloon is retracted into the ascending aorta and valve function is assessed using echocardiography. There is felt to be trace paravalvular leak and no central aortic insufficiency.  The patient's hemodynamic recovery following valve deployment is good.  The deployment balloon and guidewire are both removed. Echo demostrated acceptable post-procedural gradients, stable mitral valve function, and trace aortic insufficiency.   PROCEDURE COMPLETION:  The sheath was removed and femoral artery closure is performed using the 2 previously deployed Perclose devices.    Protamine was administered once femoral arterial repair was complete. The arteriotomy site on the right is dry after tightening of the Perclose sutures. The temporary pacemaker, pigtail catheters and femoral sheaths were removed with manual pressure used for hemostasis.   The patient tolerated the procedure well and is transported to the surgical intensive care in stable condition. There were no immediate intraoperative complications. All sponge instrument and needle counts are verified correct at completion of the operation.   The patient received a total of 61 mL of intravenous  contrast during the procedure.  Tonny Bollmanooper, [place] Tarnowski, MD 06/18/2017 12:57 PM

## 2017-06-18 NOTE — Transfer of Care (Signed)
Immediate Anesthesia Transfer of Care Note  Patient: NACHMEN MANSEL  Procedure(s) Performed: Procedure(s): TRANSCATHETER AORTIC VALVE REPLACEMENT, TRANSFEMORAL using an Edwards 26 mm Sapien 3 Aortic Valve (N/A) TRANSESOPHAGEAL ECHOCARDIOGRAM (TEE) (N/A)  Patient Location: PACU  Anesthesia Type:MAC  Level of Consciousness: drowsy  Airway & Oxygen Therapy: Patient Spontanous Breathing and Patient connected to face mask oxygen  Post-op Assessment: Report given to RN, Post -op Vital signs reviewed and stable and Patient moving all extremities  Post vital signs: Reviewed and stable  Last Vitals:  Vitals:   06/18/17 0827  BP: (!) 158/74  Pulse: 70  Resp: 18  Temp: 36.9 C    Last Pain:  Vitals:   06/18/17 0827  TempSrc: Oral         Complications: No apparent anesthesia complications

## 2017-06-18 NOTE — H&P (View-Only) (Signed)
[facility]  MULTIDISCIPLINARY HEART VALVE CLINIC  CARDIOTHORACIC SURGERY CONSULTATION REPORT  Referring Provider is [name], MD PCP is [name], MD  Chief Complaint  Patient presents with  . Aortic Stenosis    SEVERE...2ND TAVR EVAL...consulted with Dr. Tyrone Sage on 05/24/17    HPI:  Patient is an 83 year old male with history of aortic stenosis, coronary artery disease status post coronary artery bypass grafting in 2009, hypertension, hyperlipidemia, and persistent atrial fibrillation currently maintaining sinus rhythm since DC cardioversion on amiodarone and Xarelto for long-term anticoagulation who has been referred for a second surgical opinion to discuss treatment options for management of severe symptomatic aortic stenosis. The patient has known of presence of a heart murmur since childhood. He was followed for many years by Dr. Patty Sermons and remain physically active and functionally independent all of his adult life. In 2009 he presented with symptoms of chest discomfort and was found to have three-vessel coronary artery disease. He underwent coronary artery bypass grafting 5 by Dr. Tyrone Sage at that time. She did well clinically and has continued to do well until recently. For the last several years he has been followed by Dr. Donnie Aho, and transthoracic echocardiograms have demonstrated the development of severe aortic stenosis. The patient remains fairly active physically although he does experience some mild exertional shortness of breath and decreased energy. He states that he has slowed down a bit over the past year or 2. His wife suffers from Alzheimer's and has been in declining health for some time now. The patient states that he has become more fatigued and a little bit more unsteady on his feet. Recent follow-up transthoracic echocardiogram revealed the presence of severe aortic stenosis with peak velocity across the aortic valve measured greater than 4.1  m/s corresponding to mean transvalvular gradient estimated 41 mmHg. Left ventricular systolic function remains normal. The patient underwent diagnostic cardiac catheterization and was found to have continued patency of all 5 bypass grafts placed at the time of his surgery in 2009. He has severe native coronary artery disease. The patient was referred for surgical consultation and has been evaluated previously by Dr. Tyrone Sage felt the patient would be at relatively high risk for conventional surgical aortic valve replacement via redo sternotomy. Patient underwent CT angiography and has been referred for a second surgical opinion.  The patient is married and lives in an apartment at [employer] senior community in [place]. His wife lives in the memory unit there where she is cared for because of progressive Alzheimer's dementia.  The patient has remained reasonably active physically all of his adult life. He is to walk a fair amount on a regular basis but he admits that he has gradually been cutting back over the past year or 2. He states that he gets fatigue more than he used to. He states that he does not have much trouble with shortness of breath unless he exerts himself. He denies any resting shortness of breath, PND, orthopnea, or lower extremity edema. He recently developed atrial fibrillation which set him back considerably and was associated with chest discomfort and shortness of breath. He underwent DC cardioversion on 2 occasions and reportedly has been maintaining sinus rhythm since his second cardioversion on oral amiodarone.   Past Medical History:  Diagnosis Date  . Aortic stenosis 07/06/2013   Echocardiogram on 06/19/13 and showed mild to moderate aortic stenosis.  His ejection fraction is 55-60%.   Marland Kitchen BPH (benign prostatic hyperplasia) 05/30/2011  . CAD (coronary artery  disease), native coronary artery 03/07/2017   Cath 2009 with 60% LM and 95% RCA  CABG 6//19/09 with LIMA to LAD, SVG to RCA, SVG to  int-OM and SVG to Dx Dr. Tyrone Sage  . ED (erectile dysfunction)   . Gilbert syndrome 12/16/2015  . Gout   . Hyperlipidemia   . Hypertension   . OA (osteoarthritis)   . PAC (premature atrial contraction)   . Persistent atrial fibrillation (HCC) 06/16/2013   CHA2DS2VASC score 4 Cardioversion 03/07/17 with reversion in one week Cardioversion 04/10/17 after loading with amiodarone with sinus     Past Surgical History:  Procedure Laterality Date  . CARDIOVASCULAR STRESS TEST  11/03/04   EF 66%  . CARDIOVERSION N/A 03/07/2017   Procedure: CARDIOVERSION;  Surgeon: [name], MD;  Location: [facility] ENDOSCOPY;  Service: Cardiovascular;  Laterality: N/A;  . CARDIOVERSION N/A 04/10/2017   Procedure: CARDIOVERSION;  Surgeon: [name], MD;  Location: [facility] ENDOSCOPY;  Service: Cardiovascular;  Laterality: N/A;  . COLONOSCOPY  2000  . CORONARY ARTERY BYPASS GRAFT  04/2008  . RIGHT/LEFT HEART CATH AND CORONARY/GRAFT ANGIOGRAPHY N/A 05/09/2017   Procedure: Right/Left Heart Cath and Coronary/Graft Angiography;  Surgeon: Lennette Bihari, MD;  Location: [facility] INVASIVE CV LAB;  Service: Cardiovascular;  Laterality: N/A;  . TONSILLECTOMY AND ADENOIDECTOMY      Family History  Problem Relation Age of Onset  . Heart attack Father   . Heart attack Brother     Social History   Social History  . Marital status: Married    Spouse name: N/A  . Number of children: N/A  . Years of education: N/A   Occupational History  . Not on file.   Social History Main Topics  . Smoking status: Never Smoker  . Smokeless tobacco: Never Used  . Alcohol use No  . Drug use: No  . Sexual activity: Not on file   Other Topics Concern  . Not on file   Social History Narrative  . No narrative on file    Current Outpatient Prescriptions  Medication Sig Dispense Refill  . amiodarone (PACERONE) 200 MG tablet Take 200 mg by mouth daily.    . diazepam (VALIUM) 5 MG tablet Take 5 mg by mouth at bedtime as needed  (sleep).    . losartan (COZAAR) 50 MG tablet Take 1 tablet (50 mg total) by mouth daily. 90 tablet 1  . metoprolol tartrate (LOPRESSOR) 50 MG tablet Take 0.5 tablets (25 mg total) by mouth 2 (two) times daily.    . Rivaroxaban (XARELTO) 20 MG TABS tablet Take 1 tablet (20 mg total) by mouth daily with supper. 30 tablet 0  . rosuvastatin (CRESTOR) 5 MG tablet Take 1 tablet (5 mg total) by mouth 4 (four) times a week. FOUR times a week (Patient taking differently: Take 5 mg by mouth 3 (three) times a week. Mon / Wed / Fri) 48 tablet 0   No current facility-administered medications for this visit.     Allergies  Allergen Reactions  . Lisinopril Cough    cough       Review of Systems:   General:  normal appetite, decreased energy, no weight gain, no weight loss, no fever  Cardiac:  no chest pain with exertion, no chest pain at rest, +SOB with exertion, no resting SOB, no PND, no orthopnea, no palpitations, no arrhythmia, no atrial fibrillation, no LE edema, no dizzy spells, no syncope  Respiratory:  no shortness of breath, no home oxygen, no productive  cough, no dry cough, no bronchitis, no wheezing, no hemoptysis, no asthma, no pain with inspiration or cough, no sleep apnea, no CPAP at night  GI:   no difficulty swallowing, no reflux, no frequent heartburn, no hiatal hernia, no abdominal pain, no constipation, + diarrhea, no hematochezia, no hematemesis, no melena  GU:   no dysuria,  no frequency, no urinary tract infection, no hematuria, no enlarged prostate, no kidney stones, no kidney disease  Vascular:  no pain suggestive of claudication, no pain in feet, occasional leg cramps, no varicose veins, no DVT, no non-healing foot ulcer  Neuro:   no stroke, no TIA's, no seizures, no headaches, no temporary blindness one eye,  no slurred speech, no peripheral neuropathy, no chronic pain, + some instability of gait, + mild memory/cognitive dysfunction  Musculoskeletal: no arthritis, no joint  swelling, no myalgias, no difficulty walking, slightly diminished mobility   Skin:   no rash, no itching, no skin infections, no pressure sores or ulcerations  Psych:   + anxiety, no depression, no nervousness, no unusual recent stress  Eyes:   no blurry vision, no floaters, no recent vision changes, + wears glasses or contacts  ENT:   no hearing loss, no loose or painful teeth, no dentures, last saw dentist 3 months ago  Hematologic:  no easy bruising, no abnormal bleeding, no clotting disorder, no frequent epistaxis  Endocrine:  no diabetes, does not check CBG's at home           Physical Exam:   BP (!) 104/58 (BP Location: Left Arm, Patient Position: Sitting, Cuff Size: Large)   Pulse (!) 58   Resp 16   Ht 5\' 10"  (1.778 m)   Wt 173 lb (78.5 kg)   SpO2 95% Comment: ON RA  BMI 24.82 kg/m   General:  Thin, elderly but o/w  well-appearing  HEENT:  Unremarkable   Neck:   no JVD, no bruits, no adenopathy   Chest:   clear to auscultation, symmetrical breath sounds, no wheezes, no rhonchi   CV:   RRR, grade III/VI crescendo/decrescendo murmur heard best at RSB,  no diastolic murmur  Abdomen:  soft, non-tender, no masses   Extremities:  warm, well-perfused, pulses diminished but palpable, no LE edema  Rectal/GU  Deferred  Neuro:   Grossly non-focal and symmetrical throughout  Skin:   Clean and dry, no rashes, no breakdown   Diagnostic Tests:  Transthoracic Echocardiography  Patient:    Staley, Lunz MR #:       [id number] Study Date: 05/08/2017 Gender:     M Age:        18 Height:     177.8 cm Weight:     82.9 kg BSA:        2.04 m^2 Pt. Status: Room:       [id number]   ATTENDING    Georga Hacking, MD [facility]  PERFORMING   Georga Hacking, MD [facility]  ADMITTING    Inetta Fermo  SONOGRAPHER  Thurman Coyer  cc:  -------------------------------------------------------------------  ------------------------------------------------------------------- Indications:      Aortic stenosis 424.1.  ------------------------------------------------------------------- History:   PMH:   Atrial fibrillation.  Coronary artery disease. Risk factors:  Hypertension. Dyslipidemia.  ------------------------------------------------------------------- Study Conclusions  - Left ventricle: The cavity size was normal. Wall thickness was   increased in a pattern of moderate LVH. Systolic function was   normal.  Wall motion was normal; there were no regional wall   motion abnormalities. - Aortic valve: Mildly calcified annulus. Trileaflet; moderately   thickened, severely calcified leaflets. Cusp separation was   severely reduced. Valve mobility was severely restricted.   Transvalvular velocity was increased. There was severe stenosis.   There was mild regurgitation. Valve area (VTI): 0.54 cm^2. Valve   area (Vmax): 0.6 cm^2. Valve area (Vmean): 0.5 cm^2. - Mitral valve: There was mild regurgitation. - Left atrium: The atrium was moderately dilated. - Right atrium: The atrium was moderately dilated. - Pulmonary arteries: Systolic pressure was mildly increased. PA   peak pressure: 41 mm Hg (S).  ------------------------------------------------------------------- Labs, prior tests, procedures, and surgery: Coronary artery bypass grafting.  ------------------------------------------------------------------- Study data:  Comparison was made to the study of 06/19/2013.  Study status:  Routine.  Procedure:  The patient reported no pain pre or post test. Transthoracic echocardiography. Image quality was adequate. The study was technically difficult, as a result of poor acoustic windows. Intravenous contrast (Definity) was administered.  Study completion:  There were no  complications. Transthoracic echocardiography.  M-mode, complete 2D, spectral Doppler, and color Doppler.  Birthdate:  Patient birthdate: [birth year].  Age:  Patient is 83 yr old.  Sex:  Gender: male. BMI: 26.2 kg/m^2.  Blood pressure:     176/98  Patient status: Inpatient.  Study date:  Study date: 05/08/2017. Study time: 10:59 AM.  Location:  Echo laboratory.  -------------------------------------------------------------------  ------------------------------------------------------------------- Left ventricle:  The cavity size was normal. Wall thickness was increased in a pattern of moderate LVH. Systolic function was normal. Wall motion was normal; there were no regional wall motion abnormalities.  ------------------------------------------------------------------- Aortic valve:   Mildly calcified annulus. Trileaflet; moderately thickened, severely calcified leaflets. Cusp separation was severely reduced. Valve mobility was severely restricted.  Doppler:  Transvalvular velocity was increased. There was severe stenosis. There was mild regurgitation.    VTI ratio of LVOT to aortic valve: 0.17. Valve area (VTI): 0.54 cm^2. Indexed valve area (VTI): 0.27 cm^2/m^2. Peak velocity ratio of LVOT to aortic valve: 0.19. Valve area (Vmax): 0.6 cm^2. Indexed valve area (Vmax): 0.29 cm^2/m^2. Mean velocity ratio of LVOT to aortic valve: 0.16. Valve area (Vmean): 0.5 cm^2. Indexed valve area (Vmean): 0.25 cm^2/m^2. Mean gradient (S): 41 mm Hg. Peak gradient (S): 69 mm Hg.  ------------------------------------------------------------------- Aorta:  Aortic root: The aortic root was normal in size.  ------------------------------------------------------------------- Mitral valve:   Structurally normal valve.   Mobility was not restricted.  Doppler:  Transvalvular velocity was within the normal range. There was no evidence for stenosis. There was mild regurgitation.    Peak gradient (D): 3  mm Hg.  ------------------------------------------------------------------- Left atrium:  The atrium was moderately dilated.  ------------------------------------------------------------------- Right ventricle:  The cavity size was normal. Wall thickness was normal. Systolic function was normal.  ------------------------------------------------------------------- Pulmonic valve:   Not well visualized.  The valve appears to be grossly normal.    Doppler:  Transvalvular velocity was within the normal range. There was no evidence for stenosis.  ------------------------------------------------------------------- Tricuspid valve:   Structurally normal valve.    Doppler: Transvalvular velocity was within the normal range. There was mild regurgitation.  ------------------------------------------------------------------- Pulmonary artery:   The main pulmonary artery was normal-sized. Systolic pressure was mildly increased.  ------------------------------------------------------------------- Right atrium:  The atrium was moderately dilated.  ------------------------------------------------------------------- Pericardium:  There was no pericardial effusion.  ------------------------------------------------------------------- Systemic veins: Inferior vena cava: The vessel was dilated. The respirophasic diameter changes were blunted (< 50%).  -------------------------------------------------------------------  Measurements   Left ventricle                           Value          Reference  LV ID, ED, PLAX chordal                  45.4  mm       43 - 52  LV ID, ES, PLAX chordal                  30.8  mm       23 - 38  LV fx shortening, PLAX chordal           32    %        >=29  LV PW thickness, ED                      10    mm       ----------  IVS/LV PW ratio, ED                      1.29           <=1.3  Stroke volume, 2D                        57    ml       ----------   Stroke volume/bsa, 2D                    28    ml/m^2   ----------  LV e&', lateral                           9.79  cm/s     ----------  LV E/e&', lateral                         8.55           ----------  LV e&', medial                            5     cm/s     ----------  LV E/e&', medial                          16.74          ----------  LV e&', average                           7.4   cm/s     ----------  LV E/e&', average                         11.32          ----------    Ventricular septum                       Value          Reference  IVS thickness, ED                        12.9  mm       ----------  LVOT                                     Value          Reference  LVOT ID, S                               20    mm       ----------  LVOT area                                3.14  cm^2     ----------  LVOT peak velocity, S                    79.2  cm/s     ----------  LVOT mean velocity, S                    45.8  cm/s     ----------  LVOT VTI, S                              18.2  cm       ----------  LVOT peak gradient, S                    3     mm Hg    ----------    Aortic valve                             Value          Reference  Aortic valve peak velocity, S            416   cm/s     ----------  Aortic valve mean velocity, S            286   cm/s     ----------  Aortic valve VTI, S                      105   cm       ----------  Aortic mean gradient, S                  41    mm Hg    ----------  Aortic peak gradient, S                  69    mm Hg    ----------  VTI ratio, LVOT/AV                       0.17           ----------  Aortic valve area, VTI                   0.54  cm^2     ----------  Aortic valve area/bsa, VTI               0.27  cm^2/m^2 ----------  Velocity ratio, peak, LVOT/AV            0.19           ----------  Aortic valve area, peak  velocity         0.6   cm^2     ----------  Aortic valve area/bsa, peak              0.29  cm^2/m^2 ----------   velocity  Velocity ratio, mean, LVOT/AV            0.16           ----------  Aortic valve area, mean velocity         0.5   cm^2     ----------  Aortic valve area/bsa, mean              0.25  cm^2/m^2 ----------  velocity  Aortic regurg pressure half-time         469   ms       ----------    Aorta                                    Value          Reference  Aortic root ID, ED                       32    mm       ----------    Left atrium                              Value          Reference  LA ID, A-P, ES                           41    mm       ----------  LA ID/bsa, A-P                           2.01  cm/m^2   <=2.2  LA volume, S                             83    ml       ----------  LA volume/bsa, S                         40.8  ml/m^2   ----------  LA volume, ES, 1-p A4C                   75.1  ml       ----------  LA volume/bsa, ES, 1-p A4C               36.9  ml/m^2   ----------  LA volume, ES, 1-p A2C                   77.7  ml       ----------  LA volume/bsa, ES, 1-p A2C               38.2  ml/m^2   ----------    Mitral valve                             Value          Reference  Mitral E-wave  peak velocity              83.7  cm/s     ----------  Mitral A-wave peak velocity              30.1  cm/s     ----------  Mitral deceleration time         (L)     130   ms       150 - 230  Mitral peak gradient, D                  3     mm Hg    ----------  Mitral E/A ratio, peak                   2.8            ----------    Pulmonary arteries                       Value          Reference  PA pressure, S, DP               (H)     41    mm Hg    <=30    Tricuspid valve                          Value          Reference  Tricuspid regurg peak velocity           286   cm/s     ----------  Tricuspid peak RV-RA gradient            33    mm Hg    ----------    Right atrium                             Value          Reference  RA ID, S-I, ES, A4C              (H)     56.2  mm       34 -  49  RA area, ES, A4C                         18.9  cm^2     8.3 - 19.5  RA volume, ES, A/L                       51    ml       ----------  RA volume/bsa, ES, A/L                   25.1  ml/m^2   ----------    Systemic veins                           Value          Reference  Estimated CVP                            8     mm Hg    ----------    Right ventricle  Value          Reference  TAPSE                                    14.3  mm       ----------  RV pressure, S, DP               (H)     41    mm Hg    <=30  RV s&', lateral, S                        7.07  cm/s     ----------  Legend: (L)  and  (H)  mark values outside specified reference range.  ------------------------------------------------------------------- Prepared and Electronically Authenticated by  Georga Hacking, MD [facility] 2018-06-20T14:37:01   Right/Left Heart Cath and Coronary/Graft Angiography  Conclusion     LM lesion, 70 %stenosed.  Prox LAD lesion, 60 %stenosed.  Mid LAD lesion, 95 %stenosed.  Ramus lesion, 60 %stenosed.  Ost 1st Mrg lesion, 90 %stenosed.  LIMA and is normal in caliber and anatomically normal.  SVG and is anatomically normal.  Prox RCA to Mid RCA lesion, 100 %stenosed.  And is anatomically normal.  Mid RCA lesion, 75 %stenosed.  And is anatomically normal.  There is severe aortic valve stenosis. There is mild (2+) aortic regurgitation.  There is trivial (1+) mitral regurgitation.  There is mild (2+) aortic regurgitation.   Moderate right heart pressure elevation with moderate pulmonary hypertension with a PA systolic pressure at 53-55 mm Hg.  Severe aortic valve stenosis with a peak to peak (not PI)  gradient of 50 mmHg, mean  52 mm Hg, and aortic valve area of 0.4 cm.  Severe native CAD with distal left main stenosis, 60% proximal LAD stenosis followed by 95% stenosis; ostial ramus stenosis of at least 60%, subtotally occluded OM1  vessel of the left circumflex, and total proximal RCA occlusion.  Patent LIMA graft supplying the mid LAD.  Patent sequential SVG supplying the ramus and OM1 vessel.  Patent SVG supplying the diagonal vessel.  Patent SVG supplying the mid RCA.  RECOMMENDATION: Dr. Donnie Aho was notified of the results of the cardiac catheterization.  The patient will be undergoing evaluation for consideration of TAVR.   Indications   Nonrheumatic aortic valve stenosis [I35.0 (ICD-10-CM)]  Coronary artery disease involving native coronary artery of native heart with angina pectoris (HCC) [I25.119 (ICD-10-CM)]  Procedural Details/Technique   Technical Details Mr. Pearson Picou is a former patient of Dr. Patty Sermons, and he currently is being followed by Dr. Viann Fish. In 2009, he underwent CABG revascularization surgery and had a LIMA placed was LAD, a sequential vein to his ramus and OM 1 vessel, a vein to his diagonal vessel, and a vein to his RCA. He has developed progressive aortic valve stenosis. He also had developed an episode of atrial fibrillation and had undergone cardioversion. He was admitted with chest pain by Dr. Donnie Aho. An echo Doppler study showed normal systolic function with moderate LVH, severe aortic stenosis with a mean gradient of 41, peak instantaneous gradient of 69, and an aortic valve area of 0.6 cm. There was mild MR, biatrial enlargement, and an estimated PA pressure 41 mm. He is now referred for right and left heart cardiac catheterization.   The patient was brought to the second floor [facility] Cardiac cath  lab in the fasting state. The right groin was prepped and draped in sterile fashion and a 6 French arterial sheath and 7 French venous sheath were inserted without difficulty. A Swan-Ganz catheter was advanced into the venous sheath and pressures were obtained in the right atrium, right ventricle, pulmonary artery, and pulmonary capillary wedge position. Cardiac outputs  were obtained by the thermodilution and assumed Fick methods. Oxygen saturation was obtained in the pulmonary artery and aorta. A pigtail catheter was inserted and simultaneous AO/PA pressures were recorded. A straight wire was used and the pigtail catheter was able to use cross the aortic valve and was advanced into the left ventricle. Simultaneous left ventricular and PCW pressures were recorded. Left ventriculography was performed in the RAO projection. A left ventricle to aorta pullback was performed. Supravalvular aortography was performed. The pigtail catheter was then removed and diagnostic catheterization to delineate the coronary anatomy was performed utilizing 5 French Judkins 4 left and right diagnostic catheters. All catheters were removed and the patient. Hemostasis was obtained by direct manual pressure. The patient tolerated the procedure well and returned to his room in satisfactory condition.    Estimated blood loss <50 mL.  During this procedure the patient was administered the following to achieve and maintain moderate conscious sedation: Versed 1 mg, Fentanyl 25 mcg, while the patient's heart rate, blood pressure, and oxygen saturation were continuously monitored. The period of conscious sedation was 52 minutes, of which I was present face-to-face 100% of this time.    Coronary Findings   Dominance: Co-dominant  Left Main  There is left main calcification and 70% distal stenosis prior to trifurcating into the LAD, intermediate, and circumflex vessels.  LM lesion, 70% stenosed.  Left Anterior Descending  The LAD has significant proximal calcification. There is narrowing of 60%. After diagonal vessel. There is 95% focal stenosis before 2 septal perforating arteries and a "flush and fill "phenomena seen due to competitive filling from the LIMA graft.  Prox LAD lesion, 60% stenosed. The lesion is calcified.  Mid LAD lesion, 95% stenosed.  First Diagonal Branch  Vessel is small in  size.  Ramus Intermedius  The ramus intermediate, has at least a 60% ostial proximal stenosis. Competitive filling is seen from the SVG graft.  Ramus lesion, 60% stenosed.  Left Circumflex  Left circumflex is moderate size vessel. There is high-grade, diffuse proximal stenosis of the OM1 vessel which is bypassed. The distal circumflex ends in 3 small vessels.  First Obtuse Marginal Branch  Ost 1st Mrg lesion, 90% stenosed.  Right Coronary Artery  RCA is occluded proximally.  Prox RCA to Mid RCA lesion, 100% stenosed.  Mid RCA lesion, 75% stenosed.  Acute Marginal Branch  Vessel is small in size.  Graft Angiography  Free LIMA Graft to Mid LAD  LIMA and is normal in caliber and anatomically normal. The LIMA graft is angiographically normal anastomoses into the mid LAD. The distal LAD wraps around into the distal inferior wall.  Sequential saphenous Graft to Ramus, 1st Mrg  SVG and is anatomically normal. The graft exhibits . The sequential saphenous vein graft supplying the ramus intermediate and OM1 vessel is widely patent.  Graft to Mid RCA  and is anatomically normal. The graft exhibits . The SVG supplying the mid RCA is widely patent. There is stenosis of 75%, extending proximal to the graft insertion. Distal to the graft. The RCA is free of significant disease and in the PDA vessel and small continuation branch.  Graft  to 2nd Diag  and is anatomically normal. The graft exhibits . The SVG supplying the diagonal vessel is widely patent.  Right Heart   Right Heart Pressures RA: a 16; v 14; mean 13 RV: 59/18 PA: 53/21 PW: a 28; v 38; mean 24  AO: 176/79 PA: 55/23  LV: 224/26 PW: a 32; v 38; mean 27  Pullback: LV: 207/24 AO: 157/74  Oxygen saturation: AO 97%; PA 66%  Cardiac output by the thermodilution method 3.1 and by the Fick method 4.8 L/m. Cardiac index by the thermodilution method 1.6 and by the Fick method 2.4 L/m/m.  Aortic valve: The peak to peak gradient was 50  mmHg (not peak instantaneous gradient of the echo). The mean gradient calculated to 52 mm Hg.  AVA: 0.4 cm2    Wall Motion              Left Heart   Left Ventricle Global left ventricular function is normal with an ejection fraction at 50-55%. 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    Mitral Valve There is trivial (1+) mitral regurgitation.    Aortic Valve There is severe aortic valve stenosis. There is mild (2+) aortic regurgitation. There is restricted aortic valve motion.    Aorta Aortic Root: The aortic root displays dilation. There is mild (2+) aortic regurgitation.    Coronary Diagrams   Diagnostic Diagram       Implants     No implant documentation for this case.  PACS Images   Show images for Cardiac catheterization   Link to Procedure Log   Procedure Log    Hemo Data    Most Recent Value  Fick Cardiac Output 4.83 L/min  Fick Cardiac Output Index 2.43 (L/min)/BSA  Thermal Cardiac Output 3.08 L/min  Thermal Cardiac Output Index 1.55 (L/min)/BSA  Aortic Mean Gradient 52 mmHg  Aortic Peak Gradient 50 mmHg  Aortic Valve Area 0.43  Aortic Value Area Index 0.22 cm2/BSA  Mitral Mean Gradient 9.4 mmHg  Mitral Peak Gradient 1 mmHg  Mitral Valve Area Index 0.78 cm2/BSA  RA A Wave 16 mmHg  RA V Wave 14 mmHg  RA Mean 13 mmHg  RV Systolic Pressure 59 mmHg  RV Diastolic Pressure 6 mmHg  RV EDP 18 mmHg  PA Systolic Pressure 55 mmHg  PA Diastolic Pressure 23 mmHg  PA Mean 34 mmHg  PW A Wave 32 mmHg  PW V Wave 38 mmHg  PW Mean 27 mmHg  AO Systolic Pressure 176 mmHg  AO Diastolic Pressure 79 mmHg  AO Mean 117 mmHg  LV Systolic Pressure 224 mmHg  LV Diastolic Pressure 10 mmHg  LV EDP 26 mmHg  Arterial Occlusion Pressure Extended Systolic Pressure 157 mmHg  Arterial Occlusion Pressure Extended Diastolic Pressure 74 mmHg  Arterial Occlusion Pressure Extended Mean Pressure 107 mmHg  Left Ventricular Apex Extended Systolic  Pressure 207 mmHg  Left Ventricular Apex Extended Diastolic Pressure 12 mmHg  Left Ventricular Apex Extended EDP Pressure 24 mmHg  TPVR Index 21.32 HRUI  TSVR Index 74.3 HRUI  PVR SVR Ratio 0.09  TPVR/TSVR Ratio 0.29    Cardiac TAVR CT  TECHNIQUE: The patient was scanned on a Siemens 128 slice scanner. A 120 kV retrospective scan was triggered in the ascending thoracic aorta at 120 HU's. Gantry rotation speed was 300 msecs and collimation was .9 mm. No beta blockade or nitro were given. The 3D data set was reconstructed in 5% intervals of the R-R cycle. Systolic  and diastolic phases were analyzed on a dedicated work station using MPR, MIP and VRT modes. The patient received 80 cc of contrast.  FINDINGS: Aortic Valve:  Tri leaflet with severely calcified leaflets  Aorta: Normal origin great vessels No aneurysm 3 patent SVG;s and a patent LIMA are noted  Sino-tubular Junction:  26.5 mm  Ascending Thoracic Aorta:  33 mm  Aortic Arch:  26 mm  Descending Thoracic Aorta:  25 mm  Sinus of Valsalva Measurements:  Non-coronary:  30.5 mm  Right - coronary:  30 mm  Left -   coronary:  30.8 mm  Coronary Artery Height above Annulus:  Left Main:  12.7 mm above annulus with patent LIMA  Right Coronary:  13.8 mm above annulus with patent SVG;s  Virtual Basal Annulus Measurements:  Maximum / Minimum Diameter:  26.8 mm x 20.6 mm  Perimeter:  76.7 mm  Area:  442 mm2  Coronary Arteries: Sufficient height above annulus for deployment with patent bypass grafts  Optimum Fluoroscopic Angle for Delivery: LAO 15 degrees Cranial 2 degrees  IMPRESSION: 1) Severely calcified tri leaflet aortic valve with annulus 442 mm2 suitable for a 26 mm Sapien 3 valve  2) Optimum angiographic angle for deployment LAO 15 degrees Cranial 2 degrees  3) Normal aortic root  4) Coronary arteries sufficient height above annulus for deployment with patent bypass  grafts  5) Moderate bi atrial enlargement. Small filling defect in most distal tip of LAA that clears with cardiac cycle likely pectinate muscle  Charlton Haws  Electronically Signed: By: Charlton Haws M.D. On: 05/21/2017 16:23   CT ANGIOGRAPHY CHEST, ABDOMEN AND PELVIS  TECHNIQUE: Multidetector CT imaging through the chest, abdomen and pelvis was performed using the standard protocol during bolus administration of intravenous contrast. Multiplanar reconstructed images and MIPs were obtained and reviewed to evaluate the vascular anatomy.  CONTRAST:  50 mL of Isovue 370.  COMPARISON:  Chest CT 12/17/2003.  FINDINGS: CTA CHEST FINDINGS  Cardiovascular: Heart size is mildly enlarged with left atrial dilatation. There is no significant pericardial fluid, thickening or pericardial calcification. There is aortic atherosclerosis, as well as atherosclerosis of the great vessels of the mediastinum and the coronary arteries, including calcified atherosclerotic plaque in the left main, left anterior descending, left circumflex and right coronary arteries. Status post median sternotomy for CABG, including LIMA to the LAD. Severe thickening calcification of the aortic valve.  Mediastinum/Lymph Nodes: No pathologically enlarged mediastinal or hilar lymph nodes. Densely calcified right hilar lymph node incidentally noted. Esophagus is unremarkable in appearance. No axillary lymphadenopathy.  Lungs/Pleura: Calcified granuloma in the right upper lobe. No other suspicious appearing pulmonary nodules or masses are noted. No acute consolidative airspace disease. No pleural effusions.  Musculoskeletal/Soft Tissues: Median sternotomy wires. There are no aggressive appearing lytic or blastic lesions noted in the visualized portions of the skeleton.  CTA ABDOMEN AND PELVIS FINDINGS  Hepatobiliary: No suspicious cystic or solid hepatic lesions. No intra or extrahepatic  biliary ductal dilatation. Small calcified gallstones lying dependently in the gallbladder. No findings to suggest an acute cholecystitis at this time.  Pancreas: No pancreatic mass. No pancreatic ductal dilatation. No pancreatic or peripancreatic fluid or inflammatory changes.  Spleen: Tiny calcified granulomas in the spleen. Otherwise, unremarkable.  Adrenals/Urinary Tract: Bilateral kidneys and bilateral adrenal glands are normal in appearance. There is no hydroureteronephrosis or perinephric stranding to suggest urinary tract obstruction at this time. Tiny diverticulae extending off the anterior aspect of the urinary bladder incidentally noted.  Stomach/Bowel:  The appearance of the stomach is normal. There is no pathologic dilatation of small bowel or colon. Extensive colonic diverticulosis is noted, particularly in the descending colon and sigmoid colon, without surrounding inflammatory changes to suggest an acute diverticulitis at this time. Normal appendix.  Vascular/Lymphatic: Aortic atherosclerosis, with vascular findings and measurements pertinent to potential TAVR procedure, as detailed below. No aneurysm or dissection noted in the abdominal or pelvic vasculature. Celiac axis, superior mesenteric artery and inferior mesenteric artery are all widely patent without hemodynamically significant stenosis. Two right-sided renal arteries and 1 left renal artery are all widely patent without hemodynamically significant stenosis. There are numerous prominent but nonenlarged retroperitoneal lymph nodes incidentally noted (nonspecific). No other pathologically enlarged abdominal or pelvic lymph nodes are identified.  Reproductive: Median lobe hypertrophy of the prostate gland. Seminal vesicles are unremarkable in appearance.  Other: No significant volume of ascites.  No pneumoperitoneum.  Musculoskeletal: There are no aggressive appearing lytic or blastic lesions noted  in the visualized portions of the skeleton.  VASCULAR MEASUREMENTS PERTINENT TO TAVR:  AORTA:  Minimal Aortic Diameter -  16 x 16 mm  Severity of Aortic Calcification -  mild  RIGHT PELVIS:  Right Common Iliac Artery -  Minimal Diameter - 9.9 x 10.3 mm  Tortuosity - mild  Calcification - mild  Right External Iliac Artery -  Minimal Diameter - 8.8 x 8.4 mm  Tortuosity - severe  Calcification - none  Right Common Femoral Artery -  Minimal Diameter - 8.6 x 9.4 mm  Tortuosity - mild  Calcification - mild  LEFT PELVIS:  Left Common Iliac Artery -  Minimal Diameter - 10.9 x 10.3 mm  Tortuosity - mild  Calcification - mild  Left External Iliac Artery -  Minimal Diameter - 8.4 x 7.5 mm  Tortuosity - severe  Calcification - none  Left Common Femoral Artery -  Minimal Diameter - 8.8 x 8.5 mm  Tortuosity - mild  Calcification - none  Review of the MIP images confirms the above findings.  IMPRESSION: 1. Vascular findings and measurements pertinent to potential TAVR procedure, as detailed above. This patient has suitable pelvic arterial access bilaterally. 2. Severe thickening calcification of the aortic valve, compatible with the reported clinical history of severe aortic stenosis. 3. Cardiomegaly with left atrial dilatation. 4. Aortic atherosclerosis, in addition to left main and 3 vessel coronary artery disease. Status post median sternotomy for CABG including LIMA to the LAD. 5. Cholelithiasis without evidence of acute cholecystitis at this time. 6. Colonic diverticulosis without evidence of acute diverticulitis at this time. 7. Additional incidental findings, as above.   Electronically Signed   By: Trudie Reedaniel  Entrikin M.D.   On: 05/21/2017 16:53   STS Risk Calculator  Procedure    Redo sternotomy for AVR  Risk of Mortality   4.0% Morbidity or Mortality  21.3% Prolonged LOS   7.9% Short  LOS    27.9% Permanent Stroke   2.1% Prolonged Vent Support  13.4% DSW Infection    0.4% Renal Failure    7.0% Reoperation    7.9%   Impression:  Patient has stage D severe symptomatic aortic stenosis. He describes mild symptoms of progressive fatigue, decreased exercise tolerance, and exertional shortness of breath consistent with chronic diastolic congestive heart failure, [place] Heart Association functional class II.  He has a long-standing history of coronary artery disease status post coronary artery bypass grafting in 2009. I have personally reviewed the patient's recent transthoracic echocardiogram, diagnostic cardiac catheterization, and  CT angiograms. Echocardiogram confirms the presence of severe aortic stenosis with aortic valve appears to be trileaflet with severe thickening, sclerosis, and restricted leaflet mobility involving all 3 leaflets of the aortic valve. Peak velocity across the aortic valve measured greater than 4.1 m/s corresponding to mean transvalvular gradient estimated 41 mmHg. Left ventricular systolic function remains preserved.  Diagnostic cardiac catheterization reveals the presence of severe native coronary artery disease but continued patency of all 5 bypass grafts performed at the time of the patient's bypass surgery in 2009 with no significant vein graft disease or progressive distal native coronary artery disease. Risks associated with conventional surgery via redo median sternotomy would be at least moderately elevated, and I feel that this might be somewhat higher as the patient appears somewhat frail, admits to mild memory problems, and complains of increased instability of gait.  Cardiac-gated CTA of the heart reveals anatomical characteristics consistent with aortic stenosis suitable for treatment by transcatheter aortic valve replacement without any significant complicating features and CTA of the aorta and iliac vessels demonstrate what appears to be adequate  pelvic vascular access to facilitate a transfemoral approach.    Plan:  The patient was counseled at length regarding treatment alternatives for management of severe symptomatic aortic stenosis. Alternative approaches such as conventional aortic valve replacement, transcatheter aortic valve replacement, and palliative medical therapy were compared and contrasted at length.  The risks associated with conventional surgical aortic valve replacement were been discussed in detail, as were expectations for post-operative convalescence, and why I would be reluctant to consider this patient a candidate for conventional surgery.  Issues specific to transcatheter aortic valve replacement were discussed including questions about long term valve durability, the potential for paravalvular leak, possible increased risk of need for permanent pacemaker placement, and other technical complications related to the procedure itself.  Long-term prognosis with medical therapy was discussed. This discussion was placed in the context of the patient's own specific clinical presentation and past medical history.  All of their questions been addressed.  The patient hopes to proceed with transcatheter aortic valve replacement in the near future.  Following the decision to proceed with transcatheter aortic valve replacement, a discussion has been held regarding what types of management strategies would be attempted intraoperatively in the event of life-threatening complications, including whether or not the patient would be considered a candidate for the use of cardiopulmonary bypass and/or conversion to open sternotomy for attempted surgical intervention.  The patient has been advised of a variety of complications that might develop including but not limited to risks of death, stroke, paravalvular leak, aortic dissection or other major vascular complications, aortic annulus rupture, device embolization, cardiac rupture or perforation,  mitral regurgitation, acute myocardial infarction, arrhythmia, heart block or bradycardia requiring permanent pacemaker placement, congestive heart failure, respiratory failure, renal failure, pneumonia, infection, other late complications related to structural valve deterioration or migration, or other complications that might ultimately cause a temporary or permanent loss of functional independence or other long term morbidity.  The patient provides full informed consent for the procedure as described and all questions were answered.  We plan to proceed with transcatheter aortic valve replacement via transfemoral approach on Tuesday, 06/18/2017. The patient has been instructed to stop taking Xarelto after he takes his dose on Friday, 06/14/2017.   I spent in excess of 90 minutes during the conduct of this office consultation and >50% of this time involved direct face-to-face encounter with the patient for counseling and/or coordination of their care.  Salvatore Decent. Cornelius Moras, MD 06/07/2017 1:07 PM

## 2017-06-19 ENCOUNTER — Inpatient Hospital Stay (HOSPITAL_COMMUNITY): Payer: Medicare Other

## 2017-06-19 ENCOUNTER — Encounter (HOSPITAL_COMMUNITY): Payer: Self-pay | Admitting: Cardiovascular Disease

## 2017-06-19 ENCOUNTER — Other Ambulatory Visit: Payer: Self-pay

## 2017-06-19 DIAGNOSIS — K5730 Diverticulosis of large intestine without perforation or abscess without bleeding: Secondary | ICD-10-CM | POA: Insufficient documentation

## 2017-06-19 DIAGNOSIS — Z952 Presence of prosthetic heart valve: Secondary | ICD-10-CM

## 2017-06-19 DIAGNOSIS — M1990 Unspecified osteoarthritis, unspecified site: Secondary | ICD-10-CM | POA: Insufficient documentation

## 2017-06-19 DIAGNOSIS — I350 Nonrheumatic aortic (valve) stenosis: Secondary | ICD-10-CM

## 2017-06-19 DIAGNOSIS — Z954 Presence of other heart-valve replacement: Secondary | ICD-10-CM

## 2017-06-19 DIAGNOSIS — Z7982 Long term (current) use of aspirin: Secondary | ICD-10-CM | POA: Insufficient documentation

## 2017-06-19 DIAGNOSIS — Z48812 Encounter for surgical aftercare following surgery on the circulatory system: Secondary | ICD-10-CM | POA: Insufficient documentation

## 2017-06-19 LAB — POCT I-STAT 3, ART BLOOD GAS (G3+)
BICARBONATE: 25.5 mmol/L (ref 20.0–28.0)
O2 SAT: 100 %
PCO2 ART: 37.3 mmHg (ref 32.0–48.0)
PO2 ART: 182 mmHg — AB (ref 83.0–108.0)
TCO2: 27 mmol/L (ref 0–100)
pH, Arterial: 7.431 (ref 7.350–7.450)

## 2017-06-19 LAB — POCT I-STAT 4, (NA,K, GLUC, HGB,HCT)
Glucose, Bld: 133 mg/dL — ABNORMAL HIGH (ref 65–99)
HEMATOCRIT: 34 % — AB (ref 39.0–52.0)
HEMOGLOBIN: 11.6 g/dL — AB (ref 13.0–17.0)
POTASSIUM: 4 mmol/L (ref 3.5–5.1)
Sodium: 141 mmol/L (ref 135–145)

## 2017-06-19 LAB — CBC
HCT: 38.5 % — ABNORMAL LOW (ref 39.0–52.0)
Hemoglobin: 12.8 g/dL — ABNORMAL LOW (ref 13.0–17.0)
MCH: 29.7 pg (ref 26.0–34.0)
MCHC: 33.2 g/dL (ref 30.0–36.0)
MCV: 89.3 fL (ref 78.0–100.0)
PLATELETS: 120 10*3/uL — AB (ref 150–400)
RBC: 4.31 MIL/uL (ref 4.22–5.81)
RDW: 13.8 % (ref 11.5–15.5)
WBC: 6.7 10*3/uL (ref 4.0–10.5)

## 2017-06-19 LAB — BASIC METABOLIC PANEL
ANION GAP: 10 (ref 5–15)
BUN: 15 mg/dL (ref 6–20)
CALCIUM: 8.6 mg/dL — AB (ref 8.9–10.3)
CO2: 24 mmol/L (ref 22–32)
Chloride: 104 mmol/L (ref 101–111)
Creatinine, Ser: 1.06 mg/dL (ref 0.61–1.24)
Glucose, Bld: 151 mg/dL — ABNORMAL HIGH (ref 65–99)
Potassium: 3.9 mmol/L (ref 3.5–5.1)
SODIUM: 138 mmol/L (ref 135–145)

## 2017-06-19 LAB — ECHOCARDIOGRAM LIMITED
Height: 70 in
WEIGHTICAEL: 2853.63 [oz_av]

## 2017-06-19 LAB — MAGNESIUM: Magnesium: 1.7 mg/dL (ref 1.7–2.4)

## 2017-06-19 MED ORDER — ASPIRIN 81 MG PO TBEC: 81.0000 mg | DELAYED_RELEASE_TABLET | Freq: Every day | ORAL | 1 refills | Status: DC

## 2017-06-19 MED ORDER — METOPROLOL TARTRATE 25 MG PO TABS: 12.5000 mg | ORAL_TABLET | Freq: Two times a day (BID) | ORAL | 1 refills | Status: DC

## 2017-06-19 MED ORDER — RIVAROXABAN 10 MG PO TABS
10.0000 mg | ORAL_TABLET | Freq: Every day | ORAL | Status: DC
  Administered 2017-06-19: 10 mg via ORAL
  Filled 2017-06-19: qty 1

## 2017-06-19 MED ORDER — METOPROLOL TARTRATE 12.5 MG HALF TABLET: 12.5000 mg | ORAL_TABLET | Freq: Two times a day (BID) | ORAL | Status: DC

## 2017-06-19 MED ORDER — DEXTROSE 5 % IV SOLN
1.5000 g | Freq: Two times a day (BID) | INTRAVENOUS | Status: DC
  Administered 2017-06-19: 1.5 g via INTRAVENOUS
  Filled 2017-06-19: qty 1.5

## 2017-06-19 MED ORDER — ROSUVASTATIN CALCIUM 10 MG PO TABS
5.0000 mg | ORAL_TABLET | ORAL | Status: DC
  Administered 2017-06-19: 5 mg via ORAL
  Filled 2017-06-19: qty 1

## 2017-06-19 MED ORDER — AMIODARONE HCL 200 MG PO TABS
200.0000 mg | ORAL_TABLET | Freq: Every day | ORAL | Status: DC
  Administered 2017-06-19: 200 mg via ORAL
  Filled 2017-06-19: qty 1

## 2017-06-19 MED ORDER — DIAZEPAM 5 MG PO TABS: 5.0000 mg | ORAL_TABLET | Freq: Every evening | ORAL | Status: DC | PRN

## 2017-06-19 MED ORDER — TRAMADOL HCL 50 MG PO TABS: 50.0000 mg | ORAL_TABLET | Freq: Four times a day (QID) | ORAL | 0 refills | Status: DC | PRN

## 2017-06-19 MED ORDER — PERFLUTREN LIPID MICROSPHERE
INTRAVENOUS | Status: AC
  Administered 2017-06-19: 4 mL via INTRAVENOUS
  Filled 2017-06-19: qty 10

## 2017-06-19 MED ORDER — ASPIRIN EC 81 MG PO TBEC
81.0000 mg | DELAYED_RELEASE_TABLET | Freq: Every day | ORAL | Status: DC
  Administered 2017-06-19: 81 mg via ORAL
  Filled 2017-06-19: qty 1

## 2017-06-19 MED ORDER — RIVAROXABAN 10 MG PO TABS: 10.0000 mg | ORAL_TABLET | Freq: Every day | ORAL | 1 refills | Status: DC

## 2017-06-19 MED ORDER — LOSARTAN POTASSIUM 50 MG PO TABS
50.0000 mg | ORAL_TABLET | Freq: Every day | ORAL | Status: DC
  Administered 2017-06-19: 50 mg via ORAL
  Filled 2017-06-19: qty 1

## 2017-06-19 MED ORDER — RIVAROXABAN 10 MG PO TABS
10.0000 mg | ORAL_TABLET | Freq: Every day | ORAL | Status: DC
  Filled 2017-06-19 (×2): qty 1

## 2017-06-19 MED ORDER — METOPROLOL TARTRATE 12.5 MG HALF TABLET
12.5000 mg | ORAL_TABLET | Freq: Two times a day (BID) | ORAL | Status: DC
  Filled 2017-06-19: qty 1

## 2017-06-19 MED ORDER — PERFLUTREN LIPID MICROSPHERE
1.0000 mL | INTRAVENOUS | Status: AC | PRN
  Administered 2017-06-19: 4 mL via INTRAVENOUS
  Filled 2017-06-19: qty 10

## 2017-06-19 NOTE — Progress Notes (Addendum)
      [address]       [place] [id number]             [phone]        CARDIOTHORACIC SURGERY PROGRESS NOTE   R1 Day Post-Op Procedure(s) (LRB): TRANSCATHETER AORTIC VALVE REPLACEMENT, TRANSFEMORAL using an Edwards 26 mm Sapien 3 Aortic Valve (N/A) TRANSESOPHAGEAL ECHOCARDIOGRAM (TEE) (N/A)  Subjective: Looks good.  Wants to go home  Objective: Vital signs: BP Readings from Last 1 Encounters:  06/19/17 (!) 151/83   Pulse Readings from Last 1 Encounters:  06/19/17 67   Resp Readings from Last 1 Encounters:  06/19/17 15   Temp Readings from Last 1 Encounters:  06/19/17 (!) 97.5 F (36.4 C) (Oral)    Hemodynamics:    Physical Exam:  Rhythm:   sinus  Breath sounds: clear  Heart sounds:  RRR w/out murmur  Incisions:  Both groins look good  Abdomen:  Soft, non-distended, non-tender  Extremities:  Warm, well-perfused    Intake/Output from previous day: 07/31 0701 - 08/01 0700 In: 2047.4 [P.O.:240; I.V.:1607.4; IV Piggyback:200] Out: 1410 [Urine:1360; Blood:50] Intake/Output this shift: Total I/O In: -  Out: 200 [Urine:200]  Lab Results:  CBC: Recent Labs  06/18/17 1245 06/19/17 0352  WBC 4.3 6.7  HGB 11.7* 12.8*  HCT 35.8* 38.5*  PLT 98* 120*    BMET:  Recent Labs  06/19/17 0352  NA 138  K 3.9  CL 104  CO2 24  GLUCOSE 151*  BUN 15  CREATININE 1.06  CALCIUM 8.6*     PT/INR:   Recent Labs  06/18/17 1245  LABPROT 17.9*  INR 1.46    CBG (last 3)  No results for input(s): GLUCAP in the last 72 hours.  ABG    Component Value Date/Time   PHART 7.476 (H) 06/12/2017 1407   PCO2ART 29.2 (L) 06/12/2017 1407   PO2ART 96.5 06/12/2017 1407   HCO3 21.3 06/12/2017 1407   TCO2 26 05/09/2017 1014   ACIDBASEDEF 1.8 06/12/2017 1407   O2SAT 98.0 06/12/2017 1407    CXR: PORTABLE CHEST 1 VIEW  COMPARISON:  Portable chest x-ray of June 18, 2017  FINDINGS: The lungs are well-expanded. The pulmonary interstitial  markings are less conspicuous today. There is no pleural effusion. There is a stable approximately 8 mm diameter calcified nodule projecting in the inferior aspect of the right upper lobe. The heart is top-normal in size. The pulmonary vascularity is not engorged. There is calcification in the wall of the aortic arch. The sternal wires are intact. The aortic valve cage is in stable position. The right internal jugular venous catheter tip projects over the midportion of the SVC.  IMPRESSION: Stable appearance of the prosthetic aortic valve cage. Interval resolution of mild interstitial edema. No pneumonia. Previous CABG.  Thoracic aortic atherosclerosis.   Electronically Signed   By: David  SwazilandJordan M.D.   On: 06/19/2017 07:45   EKG: pending   Assessment/Plan: S/P Procedure(s) (LRB): TRANSCATHETER AORTIC VALVE REPLACEMENT, TRANSFEMORAL using an Edwards 26 mm Sapien 3 Aortic Valve (N/A) TRANSESOPHAGEAL ECHOCARDIOGRAM (TEE) (N/A)  Doing well s/p TAVR Restart home meds with reduced beta blocker dose and reduced dose Xarelto Add low dose ASA for valve Mobilize Routine f/u ECHO Routine f/u EKG Tentative d/c home later today   Purcell Nailslarence H Owen, MD 06/19/2017 8:49 AM

## 2017-06-19 NOTE — Progress Notes (Signed)
Discharge instructions reviewed with patient and patients daughter at bedside. No questions. Patients daughter given printed prescriptions.

## 2017-06-19 NOTE — Progress Notes (Signed)
Progress Note  Patient Name: Thomas Diaz Date of Encounter: 06/19/2017  Primary Cardiologist: Donnie Ahoilley  Subjective   Feels well. Eager to go home. Has walked this am without problems. No CP or dyspnea.   Inpatient Medications    Scheduled Meds: . amiodarone  200 mg Oral Daily  . aspirin EC  81 mg Oral Daily  . losartan  50 mg Oral Daily  . metoprolol tartrate  12.5 mg Oral BID  . rivaroxaban  10 mg Oral Q supper  . rosuvastatin  5 mg Oral Q M,W,F-1800   Continuous Infusions: . albumin human    . cefUROXime (ZINACEF)  IV Stopped (06/19/17 0424)  . lactated ringers     PRN Meds: albumin human, diazepam, lactated ringers, metoprolol tartrate, ondansetron (ZOFRAN) IV, perflutren lipid microspheres (DEFINITY) IV suspension, traMADol   Vital Signs    Vitals:   06/19/17 0500 06/19/17 0600 06/19/17 0700 06/19/17 0740  BP: (!) 181/101 (!) 149/70 (!) 151/83   Pulse: 87 67 67   Resp: (!) 23 19 15    Temp:    (!) 97.5 F (36.4 C)  TempSrc:    Oral  SpO2: 96% 93% 98%   Weight:      Height:        Intake/Output Summary (Last 24 hours) at 06/19/17 1200 Last data filed at 06/19/17 0740  Gross per 24 hour  Intake          2047.41 ml  Output             1610 ml  Net           437.41 ml   Filed Weights   06/18/17 0827 06/19/17 0446  Weight: 179 lb 6.4 oz (81.4 kg) 178 lb 5.6 oz (80.9 kg)    Telemetry    NSR few PVC's - Personally Reviewed  ECG    NSR ST-T abnormality consider anterior ischemia, prolonged QT - no significant change from baseline - Personally Reviewed  Physical Exam  Alert, oriented male in NAD GEN: No acute distress.   Neck: No JVD Cardiac: RRR, 2/6 SEM at the RUSB no diastolic murmur  Respiratory: Clear to auscultation bilaterally. GI: Soft, nontender, non-distended  MS: No edema; No deformity. Neuro:  Nonfocal  Psych: Normal affect   Labs    Chemistry Recent Labs Lab 06/12/17 1418 06/18/17 1239 06/19/17 0352  NA 140 141 138  K 4.2  4.0 3.9  CL 107  --  104  CO2 24  --  24  GLUCOSE 90 133* 151*  BUN 20  --  15  CREATININE 1.20  --  1.06  CALCIUM 8.8*  --  8.6*  PROT 6.7  --   --   ALBUMIN 3.8  --   --   AST 29  --   --   ALT 28  --   --   ALKPHOS 88  --   --   BILITOT 2.3*  --   --   GFRNONAA 54*  --  >60  GFRAA >60  --  >60  ANIONGAP 9  --  10     Hematology Recent Labs Lab 06/12/17 1418 06/18/17 1239 06/18/17 1245 06/19/17 0352  WBC 5.1  --  4.3 6.7  RBC 4.55  --  3.97* 4.31  HGB 13.4 11.6* 11.7* 12.8*  HCT 42.2 34.0* 35.8* 38.5*  MCV 92.7  --  90.2 89.3  MCH 29.5  --  29.5 29.7  MCHC 31.8  --  32.7  33.2  RDW 14.5  --  14.2 13.8  PLT 126*  --  98* 120*    Cardiac EnzymesNo results for input(s): TROPONINI in the last 168 hours. No results for input(s): TROPIPOC in the last 168 hours.   BNPNo results for input(s): BNP, PROBNP in the last 168 hours.   DDimer No results for input(s): DDIMER in the last 168 hours.   Radiology    Dg Chest Port 1 View  Result Date: 06/19/2017 CLINICAL DATA:  Status transcatheter aortic valve replacement yesterday. EXAM: PORTABLE CHEST 1 VIEW COMPARISON:  Portable chest x-ray of June 18, 2017 FINDINGS: The lungs are well-expanded. The pulmonary interstitial markings are less conspicuous today. There is no pleural effusion. There is a stable approximately 8 mm diameter calcified nodule projecting in the inferior aspect of the right upper lobe. The heart is top-normal in size. The pulmonary vascularity is not engorged. There is calcification in the wall of the aortic arch. The sternal wires are intact. The aortic valve cage is in stable position. The right internal jugular venous catheter tip projects over the midportion of the SVC. IMPRESSION: Stable appearance of the prosthetic aortic valve cage. Interval resolution of mild interstitial edema. No pneumonia. Previous CABG. Thoracic aortic atherosclerosis. Electronically Signed   By: David  SwazilandJordan M.D.   On: 06/19/2017 07:45    Dg Chest Port 1 View  Result Date: 06/18/2017 CLINICAL DATA:  S/p TAVR EXAM: PORTABLE CHEST 1 VIEW COMPARISON:  06/12/2017 FINDINGS: Right IJ central line tip overlies the level of the superior vena cava. Status post median sternotomy and CABG. Status post TAVR. Heart is mildly enlarged. There is mild pulmonary vascular congestion and interstitial edema. Right upper lobe granuloma. No overt alveolar edema or pleural effusions. IMPRESSION: 1. Status post TAVR. 2. Mild edema. Electronically Signed   By: Norva PavlovElizabeth  Brown M.D.   On: 06/18/2017 12:47    Cardiac Studies   2-D echocardiogram personally reviewed. Formal interpretation pending. LV function appears grossly normal. The peak and mean gradients across the aortic valve are 22 11 mmHg, respectively. There is trivial paravalvular regurgitation.  Patient Profile     83 y.o. male who presented for TAVR for severe symptomatically aortic stenosis 06/18/2017.  Assessment & Plan    1. Severe symptomatic aortic stenosis postoperative day 1 status post TAVR 2. Paroxysmal atrial fibrillation 3. Chronic diastolic heart failure, NYHA functional class II  The patient is clinically stable. His echo shows normal function of his transcatheter heart valve. I think he is stable for discharge today. He will be discharged home on Xarelto and baby aspirin in combination. He will continue his other medications with her reduced dose of his beta blocker because of bradycardia. Will arrange a follow-up visit in the office with a and P/PA in 1 week. Postoperative restrictions discussed with the patient and his family.  Enzo BiSigned, Katrin Grabel, MD  06/19/2017, 12:00 PM

## 2017-06-19 NOTE — Progress Notes (Signed)
Pt received to 4E21, telemtry monitor applied. Patient resting comfortably in chair with daughter in room. VSS. Callbell within reach.

## 2017-06-19 NOTE — Progress Notes (Signed)
  Echocardiogram 2D Echocardiogram with definity has been performed.  Leta JunglingCooper, Rami Budhu M 06/19/2017, 11:59 AM

## 2017-06-19 NOTE — Care Management Note (Addendum)
Case Management Note Donn PieriniKristi Brice Potteiger RN, BSN Unit 4E-Case Manager-- 2H coverage [phone]  Patient Details  Name: Thomas CelesteLester C Dusek MRN: [id number] Date of Birth: 27-Mar-1932  Subjective/Objective:  Pt admitted s/p TAVR on 06/18/17                  Action/Plan: PTA pt lived at home- anticipate return home- CM to follow  Expected Discharge Date:                  Expected Discharge Plan:  Home/Self Care  In-House Referral:     Discharge planning Services  CM Consult  Post Acute Care Choice:    Choice offered to:     DME Arranged:    DME Agency:     HH Arranged:    HH Agency:     Status of Service:  completed  If discussed at [employer] Length of Stay Meetings, dates discussed:    Discharge Disposition: home/self care   Additional Comments:  06/19/17- 1430- Smitty Ackerley Rn, CM- noted d/c order for today- pt's daughter to assist at discharge. No CM needs noted.   Darrold SpanWebster, Mackinzie Vuncannon Hall, RN 06/19/2017, 10:12 AM

## 2017-06-19 NOTE — Discharge Instructions (Addendum)
Transcatheter Aortic Valve Replacement, Care After  This sheet gives you information about how to care for yourself after your procedure. Your health care provider may also give you more specific instructions. If you have problems or questions, contact your health care provider. What can I expect after the procedure? After the procedure, it is common to have:  Pain around your incision areas.  Bruising and a small lump near your catheter insertion area as it heals.  Follow these instructions at home: Eating and drinking  Follow instructions from your health care provider about eating or drinking restrictions. ? Limit alcohol intake to no more than 1 drink per day for nonpregnant women and 2 drinks per day for men. One drink equals 12 oz of beer, 5 oz of wine, or 1 oz of hard liquor. ? Limit how much caffeine you drink. Caffeine can affect your heart's rate and rhythm.  Eat a heart-healthy diet. This includes low-fat (lean) proteins, whole grains, and plenty of fresh fruits and vegetables. Avoid foods that are: ? High in salt (sodium), saturated fat, or sugar. Check ingredients and nutrition facts on packaged foods and beverages. ? Canned or highly processed. ? Fried.  Drink enough fluid to keep your urine clear or pale yellow. Activity  Take naps and rest as needed throughout the day.  Return to your normal activities as told by your health care provider. Ask your health care provider what activities are safe for you.  Exercise regularly, as told by your health care provider.  Be sure to get up and move around one or more times every 1-2 hours. This helps to prevent blood clots.  Avoid climbing stairs for about 1 week.  Do not lift anything that is heavier than 10 lb (4.5 kg) until your health care provider approves. Incision care  Follow instructions from your health care provider about how to take care of your incisions. Make sure you: ? Wash your hands with soap and water  before you change your bandages (dressings). If soap and water are not available, use hand sanitizer. ? Change your dressings as told by your health care provider. ? Leave stitches (sutures) or adhesive strips in place. These skin closures may need to stay in place for 2 weeks or longer. If adhesive strip edges start to loosen and curl up, you may trim the loose edges. Do not remove adhesive strips completely unless your health care provider tells you to do that.  Check your incision areas every day for signs of infection. Check for: ? Redness, swelling, or pain. ? Fluid or blood. ? Warmth. ? Pus or a bad smell. Medicines  Take over-the-counter and prescription medicines only as told by your health care provider.  If you were prescribed an antibiotic medicine, take it as told by your health care provider. Do not stop taking the antibiotic even if you start to feel better. Travel  Avoid airplane travel for as long as told by your health care provider.  When you travel, bring a list of your medicines and a record of your medical history with you. Driving  Ask your health care provider when it is safe for you to drive. Do not drive until your health care provider approves.  Do not drive for 24 hours if you were given a medicine to help you relax (sedative) during your procedure.  Do not drive or use heavy machinery while taking prescription pain medicine, unless your health care provider approves. Lifestyle  Do not use any  products that contain nicotine or tobacco, such as cigarettes and e-cigarettes. If you need help quitting, ask your health care provider.  Resume sexual activity as told by your health care provider. Do not use medicines for erectile dysfunction unless your health care provider approves, if this applies.  Work with your health care provider to keep your blood pressure under control and to manage any other heart conditions that you have.  Maintain a healthy  weight. General instructions  Do not take baths, swim, or use a hot tub until your health care provider approves.  Do not strain to have a bowel movement.  To prevent or treat constipation while you are taking prescription pain medicine, your health care provider may recommend that you: ? Take over-the-counter or prescription medicines. ? Eat foods that are high in fiber, such as fresh fruits and vegetables, whole grains, and beans. ? Limit foods that are high in fat and processed sugars, such as fried and sweet foods.  Wear compression stockings as told by your health care provider. These stockings help to prevent blood clots and reduce swelling in your legs.  Tell all of your health care providers that you have an artificial aortic valve. If you have or have had heart disease or endocarditis, tell all health care providers about these conditions as well.  Keep all follow-up visits as told by your health care provider. This is important. Contact a health care provider if:  You develop a skin rash.  You experience sudden, unexplained weight changes.  You have redness, swelling, or pain around an incision area.  Your incision feels warm to the touch.  You have pus or a bad smell coming from an incision area.  You have a fever. Get help right away if:  Your incision area swells very quickly.  Your incision is bleeding, and the bleeding does not stop after holding pressure on the area.  The area near or just beyond your incision tingles or becomes pale, cool, or numb.  You develop chest pain.  You develop shortness of breath or have difficulty breathing. These symptoms may represent a serious problem that is an emergency. Do not wait to see if the symptoms will go away. Get medical help right away. Call your local emergency services (911 in the [place] Do not drive yourself to the hospital. Summary  After the procedure, it is common to have discomfort and bruising around the  catheter insertion site.  Avoid climbing stairs for about 1 week after your procedure.  Do not lift anything that is heavier than 10 lb (4.5 kg) until your health care provider approves.  Tell all of your health care providers that you have an artificial aortic valve. This information is not intended to replace advice given to you by your health care provider. Make sure you discuss any questions you have with your health care provider. Document Released: 09/24/2016 Document Revised: 09/24/2016 Document Reviewed: 09/24/2016 Elsevier Interactive Patient Education  2018 [employer] Information on my medicine - XARELTO (Rivaroxaban)  This medication education was reviewed with me or my healthcare representative as part of my discharge preparation.  The pharmacist that spoke with me during my hospital stay was:  Mosetta AnisMichael T Bitonti, [facility]  Why was Xarelto prescribed for you? Xarelto was prescribed for you to reduce the risk of a blood clot forming that can cause a stroke if you have a medical condition called atrial fibrillation (a type of irregular heartbeat).  What do you need to know  about xarelto ? Take your Xarelto ONCE DAILY at the same time every day with your evening meal. If you have difficulty swallowing the tablet whole, you may crush it and mix in applesauce just prior to taking your dose.  Take Xarelto exactly as prescribed by your doctor and DO NOT stop taking Xarelto without talking to the doctor who prescribed the medication.  Stopping without other stroke prevention medication to take the place of Xarelto may increase your risk of developing a clot that causes a stroke.  Refill your prescription before you run out.  After discharge, you should have regular check-up appointments with your healthcare provider that is prescribing your Xarelto.  In the future your dose may need to be changed if your kidney function or weight changes by a significant amount.  What do you do if  you miss a dose? If you are taking Xarelto ONCE DAILY and you miss a dose, take it as soon as you remember on the same day then continue your regularly scheduled once daily regimen the next day. Do not take two doses of Xarelto at the same time or on the same day.   Important Safety Information A possible side effect of Xarelto is bleeding. You should call your healthcare provider right away if you experience any of the following: ? Bleeding from an injury or your nose that does not stop. ? Unusual colored urine (red or dark brown) or unusual colored stools (red or black). ? Unusual bruising for unknown reasons. ? A serious fall or if you hit your head (even if there is no bleeding).  Some medicines may interact with Xarelto and might increase your risk of bleeding while on Xarelto. To help avoid this, consult your healthcare provider or pharmacist prior to using any new prescription or non-prescription medications, including herbals, vitamins, non-steroidal anti-inflammatory drugs (NSAIDs) and supplements.  This website has more information on Xarelto: VisitDestination.com.[email or web address].xarelto.com.

## 2017-06-19 NOTE — Progress Notes (Signed)
Pt transferred to 4E21 per SWOT RN, vss, patient removed heart monitor and will be transferred without EKG monitor per request, all patient belongings at side, daughter at side. RN made aware of situation, will continue to monitor.   Thomas Diaz,Horald Birky S 1:18 PM

## 2017-06-19 NOTE — Progress Notes (Signed)
1330-1420 Pt had already walked and eating lunch. Discussed ex ed and gave heart healthy diet. Encouraged pt to watch sodium. Pt has attended CRP 2 before and not interested in attending again.  Encouraged IS. Daughter in room also. Luetta Nuttingharlene Kendric Sindelar RN BSN 06/19/2017 2:22 PM

## 2017-06-19 NOTE — Discharge Summary (Signed)
[address]       [place] [id number]             [phone]      Physician Discharge Summary  Patient ID: Thomas Diaz MRN: [id number] DOB/AGE: 04/13/1932 83 y.o.  Admit date: 06/18/2017 Discharge date: 06/19/2017  Admission Diagnoses: Patient Active Problem List   Diagnosis Date Noted  . Chest pain   . CAD (coronary artery disease), native coronary artery 03/07/2017  . Long term current use of anticoagulant therapy 03/07/2017  . Gilbert syndrome 12/16/2015  . Nonrheumatic aortic valve stenosis 07/06/2013  . Persistent atrial fibrillation (HCC) 06/16/2013  . Gout 05/30/2011  . BPH (benign prostatic hyperplasia) 05/30/2011  . Benign hypertensive heart disease without heart failure 05/30/2011   Discharge Diagnoses:  Principal Problem:   S/P TAVR (transcatheter aortic valve replacement) Active Problems:   S/P CABG x 5   Benign hypertensive heart disease without heart failure   Persistent atrial fibrillation (HCC)   Nonrheumatic aortic valve stenosis   CAD (coronary artery disease), native coronary artery   Discharged Condition: good  HPI:  Patient is an 83 year old male with history of aortic stenosis, coronary artery disease status post coronary artery bypass grafting in 2009, hypertension, hyperlipidemia, and persistent atrial fibrillation currently maintaining sinus rhythm since DC cardioversion on amiodarone and Xarelto for long-term anticoagulation who has been referred for a second surgical opinion to discuss treatment options for management of severe symptomatic aortic stenosis. The patient has known of presence of a heart murmur since childhood. He was followed for many years by Dr. Patty Diaz and remain physically active and functionally independent all of his adult life. In 2009 he presented with symptoms of chest discomfort and was found to have three-vessel coronary artery disease. He underwent coronary artery bypass grafting 5 by Dr.  Tyrone Diaz at that time. She did well clinically and has continued to do well until recently. For the last several years he has been followed by Dr. Donnie Diaz, and transthoracic echocardiograms have demonstrated the development of severe aortic stenosis. The patient remains fairly active physically although he does experience some mild exertional shortness of breath and decreased energy. He states that he has slowed down a bit over the past year or 2. His wife suffers from Alzheimer's and has been in declining health for some time now. The patient states that he has become more fatigued and a little bit more unsteady on his feet. Recent follow-up transthoracic echocardiogram revealed the presence of severe aortic stenosis with peak velocity across the aortic valve measured greater than 4.1 m/s corresponding to mean transvalvular gradient estimated 41 mmHg. Left ventricular systolic function remains normal. The patient underwent diagnostic cardiac catheterization and was found to have continued patency of all 5 bypass grafts placed at the time of his surgery in 2009. He has severe native coronary artery disease. The patient was referred for surgical consultation and has been evaluated previously by Dr. Tyrone Diaz felt the patient would be at relatively high risk for conventional surgical aortic valve replacement via redo sternotomy. Patient underwent CT angiography and has been referred for a second surgical opinion.  The patient is married and lives in an apartment [facility] in [place]. His wife lives in the memory unit there where she is cared for because of progressive Alzheimer's dementia.  The patient has remained reasonably active physically all of his adult life. He is to walk a fair amount on a regular basis but  he admits that he has gradually been cutting back over the past year or 2. He states that he gets fatigue more than he used to. He states that he does not have much trouble with  shortness of breath unless he exerts himself. He denies any resting shortness of breath, PND, orthopnea, or lower extremity edema. He recently developed atrial fibrillation which set him back considerably and was associated with chest discomfort and shortness of breath. He underwent DC cardioversion on 2 occasions and reportedly has been maintaining sinus rhythm since his second cardioversion on oral amiodarone.  Hospital Course:  On 06/18/2017 Thomas Diaz underwent a transcatheter aortic valve replacement with Dr. Cornelius Diaz and Dr. Excell Diaz. The patient tolerated the procedure well and was transferred to the ICU for continued care. He was extubated in a timely manner. Postop day 1 the patient continues to progress. We've restarted his home medications. We initiated a reduced dose Xarelto and added aspirin for valve thrombus prophylaxis. His postoperative echocardiogram shows a normal functioning transcatheter heart valve. EKG was stable. He will continue a reduced dose of this. Blocker due to bradycardia. He is stable for discharge today with a follow-up appointment with cardiology in 1 week.  Consults: Cardiology  Significant Diagnostic Studies:   Study Conclusions  - Left ventricle: The cavity size was normal. Wall thickness was   increased in a pattern of mild LVH. Systolic function was normal.   The estimated ejection fraction was in the range of 55% to 60%. - Aortic valve: Valve area (VTI): 1.26 cm^2. Valve area (Vmax):   1.35 cm^2. Valve area (Vmean): 1.44 cm^2. - Mitral valve: There was mild regurgitation. - Left atrium: The atrium was moderately dilated. - Impressions: Pre TAVR: bulky severely calcified tri leaflet AV   with peak velocity 4.1 m/sec mean gradient 40 mmHg peak 68 mmHg   AVA .58 cm2   Post TAVR- well seated 26 mm Sapien 3 valve with trivial peri   valvular regurgiations. Peak velocity 1.2 m/sec mean gradient 2   mmHg peak 5 mmHg   AVA 1.35 cm2.  Impressions:  - Pre TAVR:  bulky severely calcified tri leaflet AV with peak   velocity 4.1 m/sec mean gradient 40 mmHg peak 68 mmHg AVA .58 cm2   Post TAVR- well seated 26 mm Sapien 3 valve with trivial peri   valvular regurgiations. Peak velocity 1.2 m/sec mean gradient 2   mmHg peak 5 mmHg   AVA 1.35 cm2.  Treatments: TAVR OPERATIVE NOTE   Date of Procedure:                06/18/2017  Preoperative Diagnosis:      Severe Aortic Stenosis   Postoperative Diagnosis:    Same   Procedure:        Transcatheter Aortic Valve Replacement - Percutaneous Right Transfemoral Approach             Edwards Sapien 3 THV (size 26 mm, model # 9600TFX, serial # [id number])              Co-Surgeons:                        Salvatore Decentlarence H. Thomas Moraswen, MD and Tonny BollmanMichael Cooper, MD  Anesthesiologist:                  Germaine PomfretE. Carswell Jackson, MD  Echocardiographer:              Charlton HawsPeter Nishan, MD  Pre-operative Echo  Findings: ? Severe aortic stenosis ? Normal left ventricular systolic function  Post-operative Echo Findings: ? Trivial paravalvular leak ? Normal left ventricular systolic function   Discharge Exam: Blood pressure (!) 158/80, pulse 72, temperature (!) 97.5 F (36.4 C), temperature source Oral, resp. rate 18, height 5\' 10"  (1.778 m), weight 80.9 kg (178 lb 5.6 oz), SpO2 100 %.   Alert, oriented male in NAD GEN:No acute distress.   Neck:No JVD Cardiac:RRR, 2/6 SEM at the RUSB no diastolic murmur  Respiratory:Clear to auscultation bilaterally. ZO:XWRU, nontender, non-distended  MS:No edema; No deformity. Neuro:Nonfocal  Psych: Normal affect   Disposition: 01-Home or Self Care  Discharge Instructions    Discharge patient    Complete by:  As directed    Discharge disposition:  01-Home or Self Care   Discharge patient date:  06/19/2017     Allergies as of 06/19/2017   No Known Allergies     Medication List    TAKE these medications   amiodarone 200 MG tablet Commonly known as:  PACERONE Take 200  mg by mouth daily.   aspirin 81 MG EC tablet Take 1 tablet (81 mg total) by mouth daily.   diazepam 5 MG tablet Commonly known as:  VALIUM Take 5 mg by mouth at bedtime as needed (sleep).   losartan 50 MG tablet Commonly known as:  COZAAR Take 1 tablet (50 mg total) by mouth daily.   metoprolol tartrate 25 MG tablet Commonly known as:  LOPRESSOR Take 0.5 tablets (12.5 mg total) by mouth 2 (two) times daily. What changed:  medication strength  how much to take   rivaroxaban 10 MG Tabs tablet Commonly known as:  XARELTO Take 1 tablet (10 mg total) by mouth daily with supper. What changed:  medication strength  how much to take   rosuvastatin 5 MG tablet Commonly known as:  CRESTOR Take 1 tablet (5 mg total) by mouth 4 (four) times a week. FOUR times a week What changed:  when to take this  additional instructions   traMADol 50 MG tablet Commonly known as:  ULTRAM Take 1 tablet (50 mg total) by mouth every 6 (six) hours as needed for moderate pain.      Follow-up Information    Tereso Newcomer T, PA-C. Go on 06/28/2017.   Specialties:  Cardiology, Physician Assistant Why:  @ 8:45 am, please arrive at least 10 minutes early Contact information: [address] [place] [address] [phone]          1. Please obtain vital signs at least one time daily 2.Please weigh the patient daily. If he or she continues to gain weight or develops lower extremity edema, contact the office at [phone]. 3. Ambulate patient at least three times daily  4. Check groin incisions daily.   Signed: Sharlene Dory 06/19/2017, 1:52 PM

## 2017-06-20 MED FILL — Insulin Regular (Human) Inj 100 Unit/ML: INTRAMUSCULAR | Qty: 1 | Status: AC

## 2017-06-20 MED FILL — Sodium Chloride IV Soln 0.9%: INTRAVENOUS | Qty: 250 | Status: AC

## 2017-06-20 MED FILL — Phenylephrine HCl Inj 10 MG/ML: INTRAMUSCULAR | Qty: 2 | Status: AC

## 2017-06-20 MED FILL — Potassium Chloride Inj 2 mEq/ML: INTRAVENOUS | Qty: 40 | Status: AC

## 2017-06-20 MED FILL — Heparin Sodium (Porcine) Inj 1000 Unit/ML: INTRAMUSCULAR | Qty: 30 | Status: AC

## 2017-06-20 MED FILL — Sodium Chloride IV Soln 0.9%: INTRAVENOUS | Qty: 100 | Status: AC

## 2017-06-20 MED FILL — Magnesium Sulfate Inj 50%: INTRAMUSCULAR | Qty: 10 | Status: AC

## 2017-06-20 NOTE — Telephone Encounter (Signed)
lmtcb for TCM follow up

## 2017-06-21 ENCOUNTER — Telehealth: Payer: Self-pay

## 2017-06-21 NOTE — Telephone Encounter (Signed)
Outreach # 2 made to Pt.  No DPR on file.  Left this nurse name and # requesting call back.

## 2017-06-27 NOTE — Progress Notes (Signed)
Cardiology Office Note:    Date:  06/28/2017   ID:  Thomas Diaz, DOB 1932/08/23, MRN [id number]  PCP:  Othella Boyerilley, William S, MD  Cardiologist:  Dr. Georga HackingW. Spencer Tilley    Referring MD: Othella Boyerilley, William S, MD   Chief Complaint  Patient presents with  . Hospitalization Follow-up    s/p TAVR    History of Present Illness:    Thomas Diaz is a 83 y.o. male with a hx of Aortic stenosis, CAD status post CABG in 2009, HTN, HL, persistent AF (maintaining sinus rhythm on amiodarone).  He was previously followed by Dr. Patty SermonsBrackbill. He is currently followed by Dr. Donnie Ahoilley. He was recently noted to have worsening aortic stenosis and was referred for consideration of aortic valve surgery. Cardiac catheterization in 6/18 demonstrated patent bypass grafts. He was evaluated by the multidisciplinary valve team. Recommendation was to proceed with TAVR. He was admitted 7/31-8/1 and underwent transcatheter aortic valve replacement via right transfemoral approach by Dr. Cornelius Moraswen and Dr. Excell Seltzerooper. Postoperative echocardiogram demonstrated well-functioning prosthesis with no significant regurgitation. Beta blocker dose was reduced due to bradycardia.   Thomas Diaz returns for post hospitalization follow-up. He is here alone. He lives [facility].  Overall, he has been doing well.  He denies chest pain, shortness of breath, syncope, orthopnea, PND or significant pedal edema.   Prior CV studies:   The following studies were reviewed today:  Echo 06/19/17 Mild LVH, EF 60-65, normal wall motion, status post TAVR with no significant regurgitation; mean gradient 11 mmHg, trivial MR, mild LAE, PASP 31  LHC 05/09/17 LM distal 70 LAD proximal 60, mid 95 RI 60 OM1 90 RCA proximal 100, mid 75 LIMA-LAD patent SVG-RI/OM1 patent SVG-mid RCA patent with 75 proximal to graft insertion SVG-D2  Echo 6/18 Moderate LVH, normal systolic function, severe aortic stenosis (mean 41, peak 69), moderate BAE,  PASP 41  Past Medical History:  Diagnosis Date  . Aortic stenosis 07/06/2013   Echocardiogram on 06/19/13 and showed mild to moderate aortic stenosis.  His ejection fraction is 55-60%. >> s/p TAVR 05/2017  . BPH (benign prostatic hyperplasia) 05/30/2011  . CAD (coronary artery disease), native coronary artery 03/07/2017   Cath 2009 with 60% LM and 95% RCA  CABG 6//19/09 with LIMA to LAD, SVG to RCA, SVG to int-OM and SVG to Dx Dr. Tyrone SageGerhardt  . ED (erectile dysfunction)   . 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 Gout   . Heart murmur    had all his life  . Hyperlipidemia   . Hypertension   . OA (osteoarthritis)   . PAC (premature atrial contraction)   . Persistent atrial fibrillation (HCC) 06/16/2013   CHA2DS2VASC score 4 Cardioversion 03/07/17 with reversion in one week Cardioversion 04/10/17 after loading with amiodarone with sinus   . 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 CARDIOVASCULAR STRESS TEST  11/03/04   EF 66%  . CARDIOVERSION N/A 03/07/2017   Procedure: CARDIOVERSION;  Surgeon: Othella BoyerWilliam S Tilley, MD;  [facility];  Service: Cardiovascular;  Laterality: N/A;  . CARDIOVERSION N/A 04/10/2017   Procedure: CARDIOVERSION;  Surgeon: [name], MD;  Location[facility] END[facility]COPY;  Service: Cardiovascular;  Laterality: N/A;  . COLONOSCOPY  2000  . CORONARY ARTERY BYPASS GRAFT  04/2008  . RIGHT/LEFT HEART CATH AND CORONARY/GRAFT ANGIOGRAPHY N/A 05/09/2017   Procedure: Right/Left Heart Cath and Coronary/Graft Angiography;  Surgeon: Lennette Bihari, MD;  Location: [facility] INVASIVE CV LAB;  Service: Cardiovascular;  Laterality: N/A;  . TEE WITHOUT CARDIOVERSION N/A  06/18/2017   Procedure: TRANSESOPHAGEAL ECHOCARDIOGRAM (TEE);  Surgeon: Tonny Bollman, MD;  Location: [facility] OR;  Service: Open Heart Surgery;  Laterality: N/A;  . TONSILLECTOMY AND ADENOIDECTOMY    . TRANSCATHETER AORTIC VALVE REPLACEMENT, TRANSFEMORAL N/A 06/18/2017   Procedure: TRANSCATHETER AORTIC VALVE REPLACEMENT, TRANSFEMORAL using an Edwards 26 mm Sapien 3 Aortic Valve;  Surgeon: Tonny Bollman, MD;  Location[facility] OR;  Service: Open Heart Surgery;  Laterality: N/A;    Current Medications: Current Meds  Medication Sig  . amiodarone (PACERONE) 200 MG tablet Take 200 mg by mouth daily.  Marland Kitchen aspirin 81 MG EC tablet Take 1 tablet (81 mg total) by mouth daily.  . diazepam (VALIUM) 5 MG tablet Take 5 mg by mouth at bedtime as needed (sleep).  . metoprolol tartrate (LOPRESSOR) 25 MG tablet Take 0.5 tablets (12.5 mg total) by mouth 2 (two) times daily.  . rosuvastatin (CRESTOR) 5 MG tablet Take 5 mg by mouth 3 (three) times a week.  . [DISCONTINUED] losartan (COZAAR) 50 MG tablet Take 1 tablet (50 mg total) by mouth daily.  . [DISCONTINUED] rivaroxaban (XARELTO) 10 MG TABS tablet Take 1 tablet (10 mg total) by mouth daily with supper.     Allergies:   Patient has no active allergies.   Social History   Social History  . Marital status: Married    Spouse name: N/A  . Number of children: N/A  . Years of education: N/A   Social History Main Topics  . Smoking status: Never Smoker  . Smokeless tobacco: Never Used  . Alcohol use No  . Drug use: No  . Sexual activity: Not Asked   Other Topics Concern  . None   Social History Narrative  . None     Family Hx: The patient's family history includes Heart attack in his brother and father.  ROS:   Please see the history of present illness.    ROS All other systems reviewed and are negative.   EKGs/Labs/Other Test Reviewed:    EKG:  EKG is  ordered today.  The ekg ordered today demonstrates NSR, HR 60, normal axis, T-wave inversions  V1-V4, poor R-wave progression, QTc 474 ms, PR 246 ms  Recent Labs: 06/12/2017: ALT 28 06/19/2017: BUN 15; Creatinine, Ser 1.06; Hemoglobin 12.8; Magnesium 1.7; Platelets 120; Potassium 3.9; Sodium 138   Creatinine Clearance (Cockcroft-Gault Equation) from [email or web address]  on 06/28/2017 ** All calculations should be rechecked by clinician prior to use **  RESULT SUMMARY: 59 mL/min Creatinine clearance, original Cockcroft-Gault   INPUTS: Sex -> 0 = Male Age -> 85 years Weight -> 175 lbs Creatinine -> 1.06 mg/dL  Recent Lipid Panel Lab Results  Component Value Date/Time   CHOL 141 04/13/2015 09:47 AM   TRIG 73.0 04/13/2015 09:47 AM   HDL 55.00 04/13/2015 09:47 AM   CHOLHDL 3 04/13/2015 09:47 AM   LDLCALC 71 04/13/2015 09:47 AM    Physical Exam:    VS:  BP (!) 172/70   Pulse 60   Ht 5\' 10"  (1.778 m)   Wt 175 lb (79.4 kg)  BMI 25.11 kg/m     Wt Readings from Last 3 Encounters:  06/28/17 175 lb (79.4 kg)  06/19/17 178 lb 5.6 oz (80.9 kg)  06/12/17 179 lb 6.4 oz (81.4 kg)     Physical Exam  Constitutional: He is oriented to person, place, and time. He appears well-developed and well-nourished. No distress.  HENT:  Head: Normocephalic and atraumatic.  Eyes: No scleral icterus.  Neck: Normal range of motion. No JVD present.  Cardiovascular: Normal rate, regular rhythm, S1 normal and S2 normal.   No murmur heard. Pulmonary/Chest: Effort normal and breath sounds normal. He has no wheezes. He has no rhonchi. He has no rales.  Abdominal: Soft. There is no hepatomegaly.  Musculoskeletal: He exhibits no edema or deformity (R and L groin w/out hematoma or bruit).  Neurological: He is alert and oriented to person, place, and time.  Skin: Skin is warm and dry.  Psychiatric: He has a normal mood and affect.    ASSESSMENT:    1. S/P TAVR (transcatheter aortic valve replacement)   2. Coronary artery disease involving native coronary artery of native heart without angina pectoris     3. Persistent atrial fibrillation (HCC)   4. Benign hypertensive heart disease without heart failure    PLAN:    In order of problems listed above:  1. S/P TAVR (transcatheter aortic valve replacement) He is recovering nicely.  His wounds have healed and he is in normal sinus rhythm.  He will remain on ASA 81 mg and Xarelto.  He should be on full dose Xarelto and this will be increased back to 20 mg QD.  He will follow up with Dr. Tonny Bollman later this month with his post op echocardiogram.  Continue SBE prophylaxis.    2. Coronary artery disease involving native coronary artery of native heart without angina pectoris Hx of CABG.  LHC recently with patent grafts.  He denies angina.  Continue ASA, statin.  3. Persistent atrial fibrillation (HCC) Maintaining NSR.  He is on Amiodarone which is managed by Dr. Donnie Aho.  CrCl is > 50.  He should be on Xarelto 20 mg QD.  -  Increase Xarelto to 20 mg QD  -  Continue beta-blocker, Amiodarone.   4. Benign hypertensive heart disease without heart failure   BP above target.  Continue current dose of beta-blocker.  This was reduced due to bradycardia.  Increase Losartan to 50 mg Twice daily.  BMET in 2 weeks.  Monitor BP at home and bring list of readings to next appt.  Dispo:  Return in about 2 weeks (around 07/15/2017) for Scheduled Follow Up, w/ Dr. Excell Seltzer.   Medication Adjustments/Labs and Tests Ordered: Current medicines are reviewed at length with the patient today.  Concerns regarding medicines are outlined above.  Tests Ordered: Orders Placed This Encounter  Procedures  . Basic Metabolic Panel (BMET)  . EKG 12-Lead   Medication Changes: Meds ordered this encounter  Medications  . DISCONTD: rivaroxaban (XARELTO) 20 MG TABS tablet    Sig: Take 1 tablet (20 mg total) by mouth daily with supper.    Dispense:  90 tablet    Refill:  3    DOSE INCREASE  . DISCONTD: losartan (COZAAR) 50 MG tablet    Sig: Take 1 tablet (50 mg total) by  mouth daily.    Dispense:  90 tablet    Refill:  3    DOSE INCREASE  . losartan (COZAAR) 50 MG tablet    Sig: Take  1 tablet (50 mg total) by mouth daily.    Dispense:  90 tablet    Refill:  3    DOSE INCREASE  . rivaroxaban (XARELTO) 20 MG TABS tablet    Sig: Take 1 tablet (20 mg total) by mouth daily with supper.    Dispense:  90 tablet    Refill:  3    DOSE INCREASE    Signed, Tereso Newcomer, PA-C  06/28/2017 1:43 PM    [facility] [address] [place], [place], [place]  [address] Phone: [phone]; Fax: [phone]

## 2017-06-28 ENCOUNTER — Ambulatory Visit (INDEPENDENT_AMBULATORY_CARE_PROVIDER_SITE_OTHER): Payer: Medicare Other | Admitting: Physician Assistant

## 2017-06-28 ENCOUNTER — Telehealth: Payer: Self-pay | Admitting: Physician Assistant

## 2017-06-28 ENCOUNTER — Encounter: Payer: Self-pay | Admitting: Physician Assistant

## 2017-06-28 VITALS — BP 172/70 | HR 60 | Ht 70.0 in | Wt 175.0 lb

## 2017-06-28 DIAGNOSIS — I2510 Atherosclerotic heart disease of native coronary artery without angina pectoris: Secondary | ICD-10-CM

## 2017-06-28 DIAGNOSIS — I119 Hypertensive heart disease without heart failure: Secondary | ICD-10-CM

## 2017-06-28 DIAGNOSIS — I4819 Other persistent atrial fibrillation: Secondary | ICD-10-CM

## 2017-06-28 DIAGNOSIS — I481 Persistent atrial fibrillation: Secondary | ICD-10-CM | POA: Diagnosis not present

## 2017-06-28 DIAGNOSIS — Z952 Presence of prosthetic heart valve: Secondary | ICD-10-CM | POA: Diagnosis not present

## 2017-06-28 MED ORDER — RIVAROXABAN 20 MG PO TABS: 20.0000 mg | ORAL_TABLET | Freq: Every day | ORAL | 3 refills | Status: DC

## 2017-06-28 MED ORDER — LOSARTAN POTASSIUM 50 MG PO TABS: 50.0000 mg | ORAL_TABLET | Freq: Every day | ORAL | 3 refills | Status: DC

## 2017-06-28 MED ORDER — LOSARTAN POTASSIUM 50 MG PO TABS: 50.0000 mg | ORAL_TABLET | Freq: Every day | ORAL | 3 refills | Status: AC

## 2017-06-28 MED ORDER — LOSARTAN POTASSIUM 50 MG PO TABS: 50.0000 mg | ORAL_TABLET | Freq: Two times a day (BID) | ORAL | 3 refills | Status: DC

## 2017-06-28 NOTE — Addendum Note (Signed)
Addended by: Tarri FullerFIATO, Salsabeel Gorelick M on: 06/28/2017 01:51 PM   Modules accepted: Orders

## 2017-06-28 NOTE — Telephone Encounter (Signed)
I called pt in regards to his Losartan dose should be Losartan 50 mg BID. I apologized that I had typed in the wrong amount on his AVS. Pt does state though that he has taken his BP 3 times since lunch time today and would like PA not know of his concerns on increasing losartan. Pt gave me readings of 138/71, 129/62, 120/57. Pt states these were all about 5 minutes apart from each other. Pt would like to try taking Losartan 50 mg daily and will take extra if he feels like his BP is too high. I advised pt that I will d/w PA for any further recommendations. I did ask pt to monitor BP and call me on Monday with readings. Pt agreeable to plan of care.

## 2017-06-28 NOTE — Patient Instructions (Addendum)
Medication Instructions:  1. INCREASE XARELTO TO 20 MG DAILY; RX HAS BEEN SENT IN  2. INCREASE LOSARTAN TO 50 MG TWICE DAILY; RX HAS BEEN SENT IN  Labwork: 1. BMET TO BE DONE ON 07/15/17 WHEN YOU SEE DR. Excell SeltzerOOPER  Testing/Procedures: NONE ORDERED TODAY  Follow-Up: KEEP YOUR UPCOMING APPT WITH DR. Excell SeltzerOOPER ON 07/15/17 @ 11 AM Any Other Special Instructions Will Be Listed Below (If Applicable).   BRING A LIST OF YOUR BLOOD PRESSURE READINGS TO YOUR FOLLOW UP APPT   If you need a refill on your cardiac medications before your next appointment, please call your pharmacy. PT ASKED TO CHANGE HIS PHARMACY TO [facility] AND TO NOT SEND IN RX YET FOR INCREASED DOSE OF XARELTO OR INCREASED DOSE OF LOSARTAN; SINCE HE STILL HAS SOME AT HOME AND HE CAN DOUBLE UP.

## 2017-06-28 NOTE — Telephone Encounter (Signed)
Patient is calling with concerns over the directions of his newest medication Losartan 50 mg.  He was told to take it twice daily but it doesn't say on the bottle.  Please call.

## 2017-06-28 NOTE — Telephone Encounter (Signed)
Continue Losartan 50 mg QD Monitor BP Call if consistently > 140/90. Tereso NewcomerScott Weaver, PA-C    06/28/2017 2:19 PM

## 2017-06-28 NOTE — Telephone Encounter (Signed)
Pt has been advised Tereso NewcomerScott Weaver, PA ok with pt wanting to take Losartan 50 mg once a day as opposed to the increase made today to 50 mg BID. Pt aware to continue to monitor BP and call the office [phone] if consistently >140/90. Pt will call me on Monday 8/13 with BP readings. Pt thanked me for the update.

## 2017-07-01 ENCOUNTER — Telehealth: Payer: Self-pay | Admitting: Physician Assistant

## 2017-07-01 NOTE — Telephone Encounter (Signed)
New message    Pt was told to call Thomas Diaz on Monday regarding his bp, he refused to leave information with me , just have Thomas Diaz call when she is in

## 2017-07-01 NOTE — Telephone Encounter (Signed)
BP looks good. Continue current medications at current dosages and follow up as planned. Tereso NewcomerScott Marijke Guadiana, PA-C    07/01/2017 12:07 PM

## 2017-07-01 NOTE — Telephone Encounter (Signed)
See phone note from 8/10. Pt called in today with BP readings as per our conversation 8/10. BP Readings: Friday night 8:30 pm 128/59 hr 65 Saturday AM 132/57 hr 57 Saturday PM 148/73 hr 64 Friday AM 136/74 hr 62 Friday PM 131/62 hr 66 Monday AM 129/68 hr 61  Pt states he feels fine on the Losartan 50 mg daily and really does not want to change his BP medications. Pt states he should be following up with Dr. Donnie Ahoilley soon. Pt does have a appt with Dr.

## 2017-07-01 NOTE — Telephone Encounter (Signed)
Pt has appt with Dr. Excell Seltzerooper 07/15/17.

## 2017-07-09 DIAGNOSIS — M7989 Other specified soft tissue disorders: Secondary | ICD-10-CM | POA: Insufficient documentation

## 2017-07-15 ENCOUNTER — Ambulatory Visit (HOSPITAL_COMMUNITY): Payer: Medicare Other | Attending: Cardiology

## 2017-07-15 ENCOUNTER — Ambulatory Visit (INDEPENDENT_AMBULATORY_CARE_PROVIDER_SITE_OTHER): Payer: Medicare Other | Admitting: Cardiovascular Disease

## 2017-07-15 ENCOUNTER — Encounter: Payer: Self-pay | Admitting: Cardiovascular Disease

## 2017-07-15 VITALS — BP 128/66 | HR 63 | Ht 70.0 in | Wt 176.2 lb

## 2017-07-15 DIAGNOSIS — I2510 Atherosclerotic heart disease of native coronary artery without angina pectoris: Secondary | ICD-10-CM

## 2017-07-15 DIAGNOSIS — Z951 Presence of aortocoronary bypass graft: Secondary | ICD-10-CM | POA: Diagnosis not present

## 2017-07-15 DIAGNOSIS — Z952 Presence of prosthetic heart valve: Secondary | ICD-10-CM | POA: Diagnosis not present

## 2017-07-15 DIAGNOSIS — I081 Rheumatic disorders of both mitral and tricuspid valves: Secondary | ICD-10-CM | POA: Insufficient documentation

## 2017-07-15 DIAGNOSIS — I10 Essential (primary) hypertension: Secondary | ICD-10-CM | POA: Insufficient documentation

## 2017-07-15 DIAGNOSIS — E785 Hyperlipidemia, unspecified: Secondary | ICD-10-CM | POA: Insufficient documentation

## 2017-07-15 DIAGNOSIS — I350 Nonrheumatic aortic (valve) stenosis: Secondary | ICD-10-CM | POA: Diagnosis not present

## 2017-07-15 DIAGNOSIS — I4891 Unspecified atrial fibrillation: Secondary | ICD-10-CM | POA: Diagnosis not present

## 2017-07-15 MED ORDER — PERFLUTREN LIPID MICROSPHERE
1.0000 mL | INTRAVENOUS | Status: AC | PRN
  Administered 2017-07-15: 2 mL via INTRAVENOUS

## 2017-07-15 NOTE — Patient Instructions (Signed)
Medication Instructions:  Your physician recommends that you continue on your current medications as directed. Please refer to the Current Medication list given to you today.  You may stop taking Aspirin in January.  Labwork: None ordered  Testing/Procedures: None ordered  Follow-Up: Your physician wants you to follow-up in: 6-8 weeks with Dr. Donnie Aho   Any Other Special Instructions Will Be Listed Below (If Applicable).     If you need a refill on your cardiac medications before your next appointment, please call your pharmacy.

## 2017-07-15 NOTE — Progress Notes (Signed)
Cardiology Office Note Date:  07/15/2017   ID:  Thomas Diaz, DOB [birth year], MRN [id number]  PCP:  [name], MD  Cardiologist:  Dr Donnie Aho  Chief Complaint  Patient presents with  . Follow-up    30-day TAVR FU     History of Present Illness: Thomas Diaz is a 83 y.o. male who presents for TAVR follow-up after undergoing TAVR via a transfemoral approach 06/18/2017.  The patient has had previous coronary bypass surgery in 2009. Other pertinent medical problems include hypertension, hyperlipidemia, and persistent atrial fibrillation now maintaining sinus rhythm on amiodarone.  The patient is here alone today. He notes improvement in his clinical symptoms since undergoing TAVR. He walks about a quarter of a milder see his wife in the memory care unit on a daily basis and he has appreciated improved exercise capacity. He denies shortness of breath. He denies chest pain or palpitations. No lightheadedness or syncope.  Past Medical History:  Diagnosis Date  . Aortic stenosis 07/06/2013   Echocardiogram on 06/19/13 and showed mild to moderate aortic stenosis.  His ejection fraction is 55-60%. >> s/p TAVR 05/2017  . BPH (benign prostatic hyperplasia) 05/30/2011  . CAD (coronary artery disease), native coronary artery 03/07/2017   Cath 2009 with 60% LM and 95% RCA  CABG 6//19/09 with LIMA to LAD, SVG to RCA, SVG to int-OM and SVG to Dx Dr. Tyrone Sage  . ED (erectile dysfunction)   . 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 Gout   . Heart murmur    had all his life  . Hyperlipidemia   . Hypertension   . OA (osteoarthritis)   . PAC (premature atrial contraction)   . Persistent atrial fibrillation (HCC) 06/16/2013   CHA2DS2VASC score 4 Cardioversion 03/07/17 with reversion in one week Cardioversion 04/10/17 after loading with amiodarone with sinus   . S/P CABG x 5 05/12/2008   CABG x 5 - LIMA to LAD, SVG to Diag, Sequential SVG to ramus-OM1, SVG to RCA - Dr  Tyrone Sage  . 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 CARDIOVASCULAR STRESS TEST  11/03/04   EF 66%  . CARDIOVERSION N/A 03/07/2017   Procedure: CARDIOVERSION;  Surgeon: [name], MD;  Location: [facility] ENDOSCOPY;  Service: Cardiovascular;  Laterality: N/A;  . CARDIOVERSION N/A 04/10/2017   Procedure: CARDIOVERSION;  Surgeon: [name], MD;  Location: [facility] ENDOSCOPY;  Service: Cardiovascular;  Laterality: N/A;  . COLONOSCOPY  2000  . CORONARY ARTERY BYPASS GRAFT  04/2008  . RIGHT/LEFT HEART CATH AND CORONARY/GRAFT ANGIOGRAPHY N/A 05/09/2017   Procedure: Right/Left Heart Cath and Coronary/Graft Angiography;  Surgeon: Lennette Bihari, MD;  Location: [facility] INVASIVE CV LAB;  Service: Cardiovascular;  Laterality: N/A;  . TEE WITHOUT CARDIOVERSION N/A 06/18/2017   Procedure: TRANSESOPHAGEAL ECHOCARDIOGRAM (TEE);  Surgeon: Tonny Bollman, MD;  Location: [facility] OR;  Service: Open Heart Surgery;  Laterality: N/A;  . TONSILLECTOMY AND ADENOIDECTOMY    . TRANSCATHETER AORTIC VALVE REPLACEMENT, TRANSFEMORAL N/A 06/18/2017   Procedure: TRANSCATHETER AORTIC VALVE REPLACEMENT, TRANSFEMORAL using an Edwards 26 mm Sapien 3 Aortic Valve;  Surgeon: Tonny Bollman, MD;  Location: [facility] OR;  Service: Open Heart Surgery;  Laterality: N/A;    Current Outpatient Prescriptions  Medication Sig Dispense Refill  . amiodarone (PACERONE) 200 MG tablet Take  200 mg by mouth daily.    Marland Kitchen aspirin 81 MG EC tablet Take 1 tablet (81 mg total) by mouth daily. 30 tablet 1  . diazepam (VALIUM) 5 MG tablet Take 5 mg by mouth at bedtime as needed (sleep).    . losartan (COZAAR) 50 MG tablet Take 1 tablet (50 mg total) by mouth daily. 90 tablet 3  . metoprolol tartrate (LOPRESSOR) 25 MG tablet Take 0.5 tablets (12.5 mg total) by mouth 2 (two) times daily. 60  tablet 1  . rosuvastatin (CRESTOR) 5 MG tablet Take 5 mg by mouth 3 (three) times a week.    Carlena Hurl 10 MG TABS tablet Take 10 mg by mouth 2 (two) times daily.     No current facility-administered medications for this visit.     Allergies:   Patient has no active allergies.   Social History:  The patient  reports that he has never smoked. He has never used smokeless tobacco. He reports that he does not drink alcohol or use drugs.   Family History:  The patient's  family history includes Heart attack in his brother and father.   ROS:  Please see the history of present illness.  All other systems are reviewed and negative.   PHYSICAL EXAM: VS:  BP 128/66   Pulse 63   Ht 5\' 10"  (1.778 m)   Wt 176 lb 3.2 oz (79.9 kg)   SpO2 97%   BMI 25.28 kg/m  , BMI Body mass index is 25.28 kg/m. GEN: Well nourished, well developed, pleasant elderly male in no acute distress  HEENT: normal  Neck: no JVD, no masses. No carotid bruits Cardiac: RRR without murmur or gallop                Respiratory:  clear to auscultation bilaterally, normal work of breathing GI: soft, nontender, nondistended, + BS MS: no deformity or atrophy  Ext: no pretibial edema, pedal pulses 2+= bilaterally Skin: warm and dry, no rash Neuro:  Strength and sensation are intact Psych: euthymic mood, full affect  EKG:  EKG is not ordered today.  Recent Labs: 06/12/2017: ALT 28 06/19/2017: BUN 15; Creatinine, Ser 1.06; Hemoglobin 12.8; Magnesium 1.7; Platelets 120; Potassium 3.9; Sodium 138   Lipid Panel     Component Value Date/Time   CHOL 141 04/13/2015 0947   TRIG 73.0 04/13/2015 0947   HDL 55.00 04/13/2015 0947   CHOLHDL 3 04/13/2015 0947   VLDL 14.6 04/13/2015 0947   LDLCALC 71 04/13/2015 0947      Wt Readings from Last 3 Encounters:  07/15/17 176 lb 3.2 oz (79.9 kg)  06/28/17 175 lb (79.4 kg)  06/19/17 178 lb 5.6 oz (80.9 kg)     Cardiac Studies Reviewed: 2D Echo: Study Conclusions  - Left  ventricle: The cavity size was normal. There was mild   concentric hypertrophy. Systolic function was normal. Wall motion   was normal; there were no regional wall motion abnormalities.   Left ventricular diastolic function parameters were normal. - Aortic valve: S/P TAVR with AV bioprosthesis functioning   normally. There is very trivial perivavluar regurgitation see   only in the apical 3 chamber view and mean AVG is normal at   . Mean gradient (S): 7 mm Hg. Peak gradient (S): 14 mm Hg. - Mitral valve: Mild focal calcification of the anterior leaflet.   There was mild regurgitation. - Left atrium: The atrium was mildly dilated. - Atrial septum: There was increased thickness of the septum,  consistent with lipomatous hypertrophy. - Pulmonic valve: There was trivial regurgitation.   ASSESSMENT AND PLAN: Aortic valve disorder status post TAVR: The patient is stable with New York Heart Association functional class I symptoms of chronic diastolic heart failure. His echo images are reviewed and demonstrate normal function of his aortic valve prosthesis with a low mean gradient of 7 mmHg. I recommended that he continue on low-dose aspirin for a period of 6 months in the context of chronic anticoagulation. He will follow-up with Dr. Donnie Aho and we will plan to see him back in one year with an echocardiogram.  Current medicines are reviewed with the patient today.  The patient does not have concerns regarding medicines.  Labs/ tests ordered today include:  No orders of the defined types were placed in this encounter.   Disposition:   FU one year Valve Clinic with an echo. Otherwise FU with Dr Donnie Aho as planned.   Enzo Bi, MD  07/15/2017 1:23 PM    [facility] [address] [place], [place], [place]  [address] Phone: [phone]; Fax: [phone]

## 2017-07-17 ENCOUNTER — Encounter: Payer: Self-pay | Admitting: Thoracic Surgery (Cardiothoracic Vascular Surgery)

## 2017-08-02 DIAGNOSIS — N529 Male erectile dysfunction, unspecified: Secondary | ICD-10-CM | POA: Insufficient documentation

## 2017-11-05 IMAGING — DX DG CHEST 2V
2 series · 2 of 2 positions shown · non-contrast
Comparison: June 03, 2008

CLINICAL DATA: Chest pain for several days

EXAM:
CHEST  2 VIEW

[w chest pa]
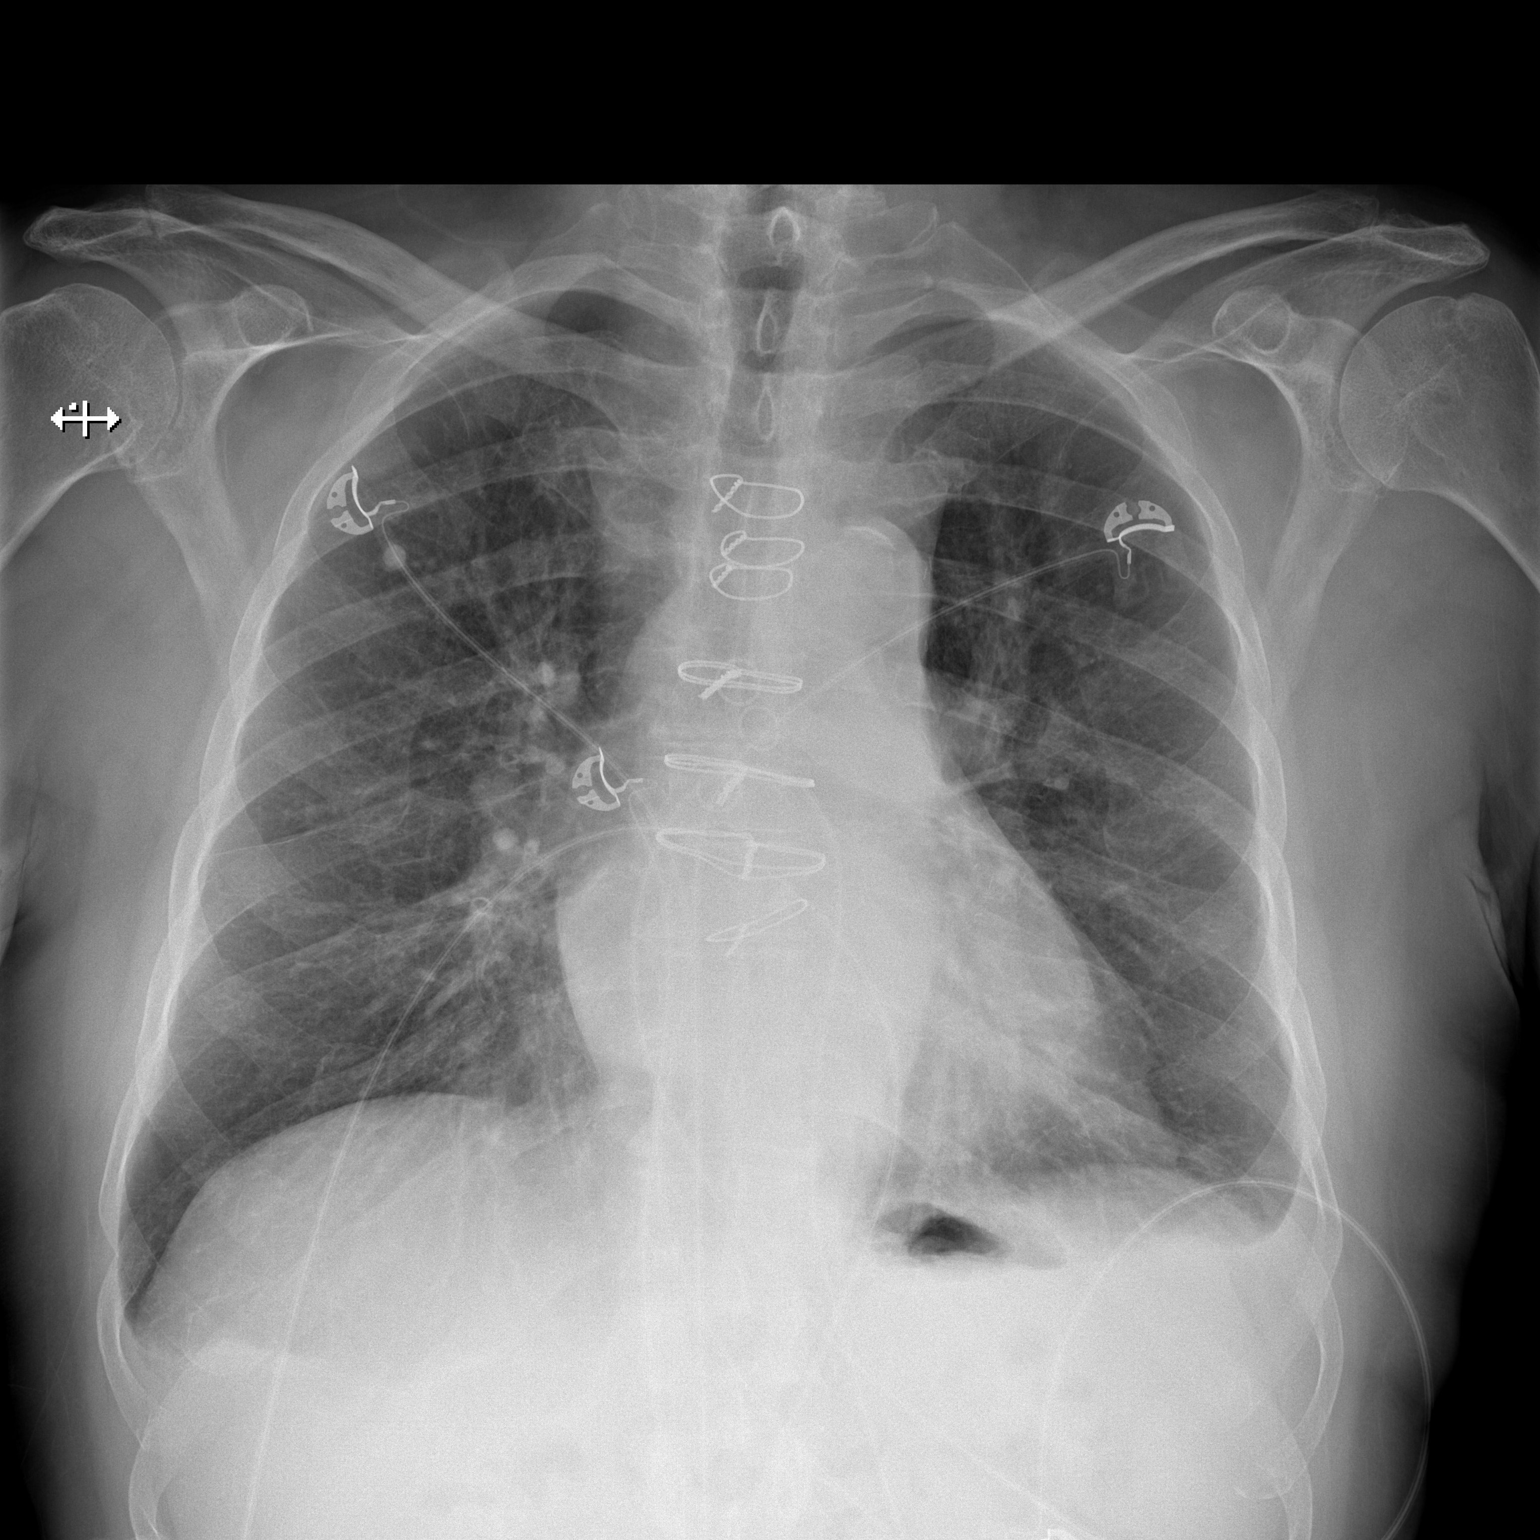

[w chest lat]
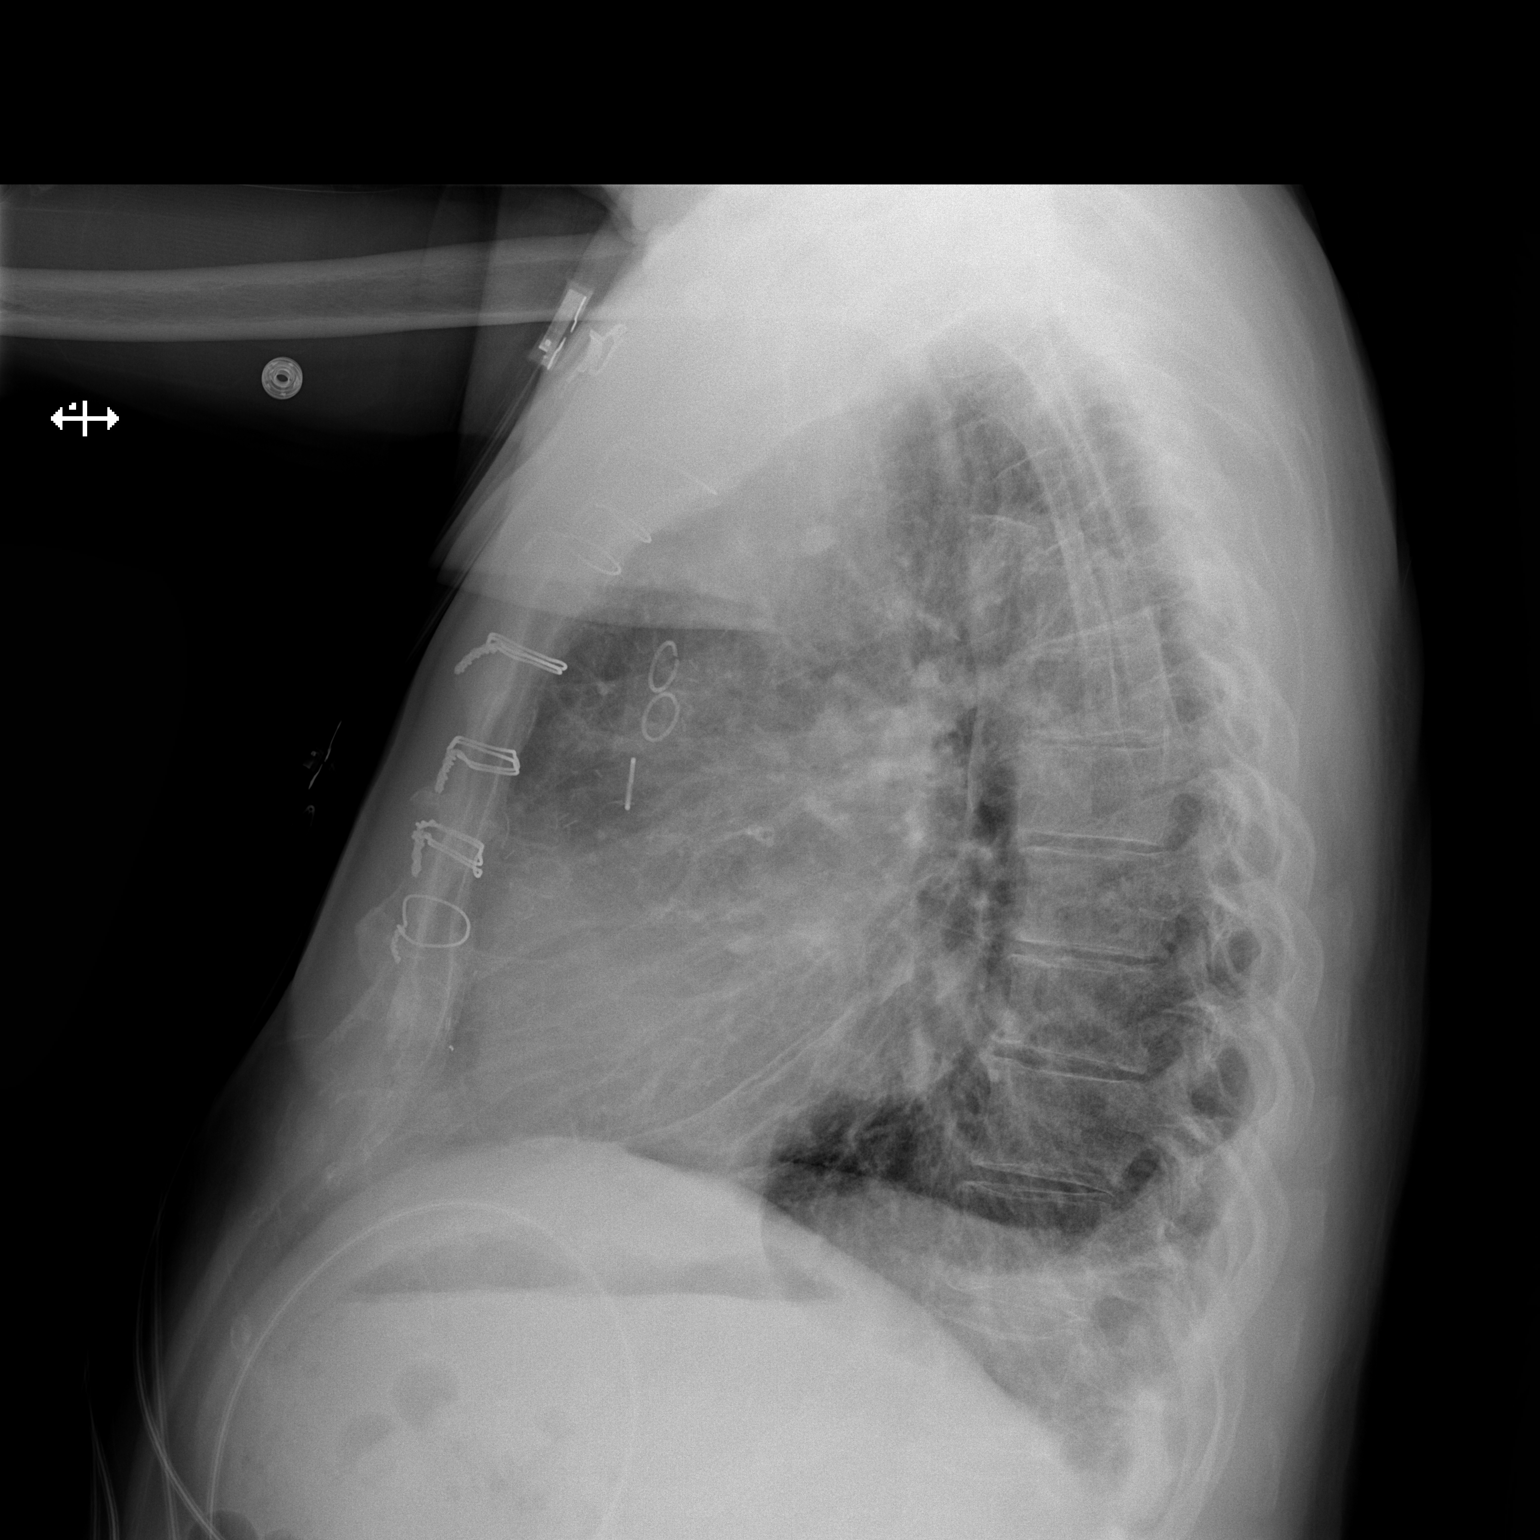

[2 of 2 positions shown; findings below may reference images not displayed]

FINDINGS: There is a small left pleural effusion. There is no edema or
consolidation. Heart is upper normal in size with pulmonary
vascularity within normal limits. Patient is status post coronary
artery bypass grafting. There is aortic atherosclerosis. No bone
lesions.
IMPRESSION: Small left pleural effusion. No edema or consolidation. Stable
cardiac silhouette. There is aortic atherosclerosis.

## 2017-12-04 ENCOUNTER — Telehealth: Payer: Self-pay | Admitting: Cardiovascular Disease

## 2017-12-04 NOTE — Telephone Encounter (Signed)
   Primary Cardiologist: No primary care provider on file.  Chart reviewed as part of pre-operative protocol coverage. Last seen by Dr.Cooper 07/15/2017. S/p TAVR via a transfemoral approach 06/18/2017. Patient will need SBE prophylaxis prior to cleaning. Left voice mail to call back.   Ali MolinaBhavinkumar Dirk Vanaman, [place] 12/04/2017, 3:06 PM

## 2017-12-04 NOTE — Telephone Encounter (Signed)
New message    1. What dental office are you calling from? Dr Sherrie MustacheFisher  2. What is your office phone and fax number? Fax [phone]  3. What type of procedure is the patient having performed? cleaning  4. What date is procedure scheduled or is the patient there now?there now  5. What is your question (ex. Antibiotics prior to procedure, holding medication-we need to know how long dentist wants pt to hold med)? Is pre medication needed

## 2017-12-09 NOTE — Telephone Encounter (Signed)
Ronie Spiesayna Dunn PA spoke with MD Dr. Johney FrameAllred who recommended the issue be run by structural heart team. Dr. Excell Seltzerooper and Dr. Clifton JamesMcAlhany are presently off but Carlean JewsKatie Thompson PA-C fielded call - has seen similar situations without adverse outcome - she will call and review with the patient. He is aware of need for future antibiotic use.

## 2017-12-09 NOTE — Telephone Encounter (Signed)
Called pt to make him aware that he would nee the Prophylaxis Abx anytime before he has anything done at his Dentist.  Pt made me aware that he found out afterwards, and took his 4 pills, and that he has 4 more for a future cleaning.

## 2017-12-09 NOTE — Telephone Encounter (Signed)
Pt has been made aware of the standard protocol of the Abx with dental, and some gastrointestinal/urinary surgeries, in the future. Pt declined a standing rx, stated that he was covered and he didn't need it.  Pt was made aware of signs of infection, and if he noticed any, to call us immediately.  Pt thanked me for the call.

## 2017-12-09 NOTE — Telephone Encounter (Signed)
   Chart reviewed as part of pre-operative protocol coverage. I agree with Vin's assessment (All TAVR patients require prophylactic antibiotics prior to all dental work (including cleanings).  Callback staff, please call patient and advise him on the above. If the dentist is not prescribing this himself, please send in prescription for amoxicillin 500mg , take 4 capsules by mouth 1 hour prior to dental procedure, disp #4 with 1 refill to patient's pharmacy of choice.  Laurann Montanaayna N Dunn, PA-C 12/09/2017, 2:11 PM

## 2017-12-09 NOTE — Telephone Encounter (Signed)
Called and discussed with patient who is now clear on SBE prophylaxis instructions. I reassured him that the risk of endocarditis from a dental cleaning is low but he needs antibiotics in the future.   Cline CrockKathryn Deniz Hannan PA-C  [facility]

## 2017-12-09 NOTE — Telephone Encounter (Signed)
Please make patient aware that for any future dental procedures including cleanings, and some gastrointestinal/urinary surgeries, the antibiotics will be standard protocol - please make sure he knows it is very important that he take this dose 1 hour prior to procedure, and the medication of choice is amoxicillin. Please make sure this is the 4 pills that he has on hand from whoever previously prescribed this medicine - it should be 2000mg  total of amoxicillin. Please offer to send in RX regardless as listed below to have on hand at pharmacy in the future. He should observe for any signs of infection following his cleaning including any fevers, chills, generalized fatigue/malaise, or not feeling well and notify our office immediately.  Ambriella Kitt PA-C

## 2017-12-18 IMAGING — DX DG CHEST 1V PORT
1 series · 1 of 1 positions shown · non-contrast
Comparison: Portable chest x-ray June 18, 2017

CLINICAL DATA: Status transcatheter aortic valve replacement
yesterday.

EXAM:
PORTABLE CHEST 1 VIEW

[chest ap]
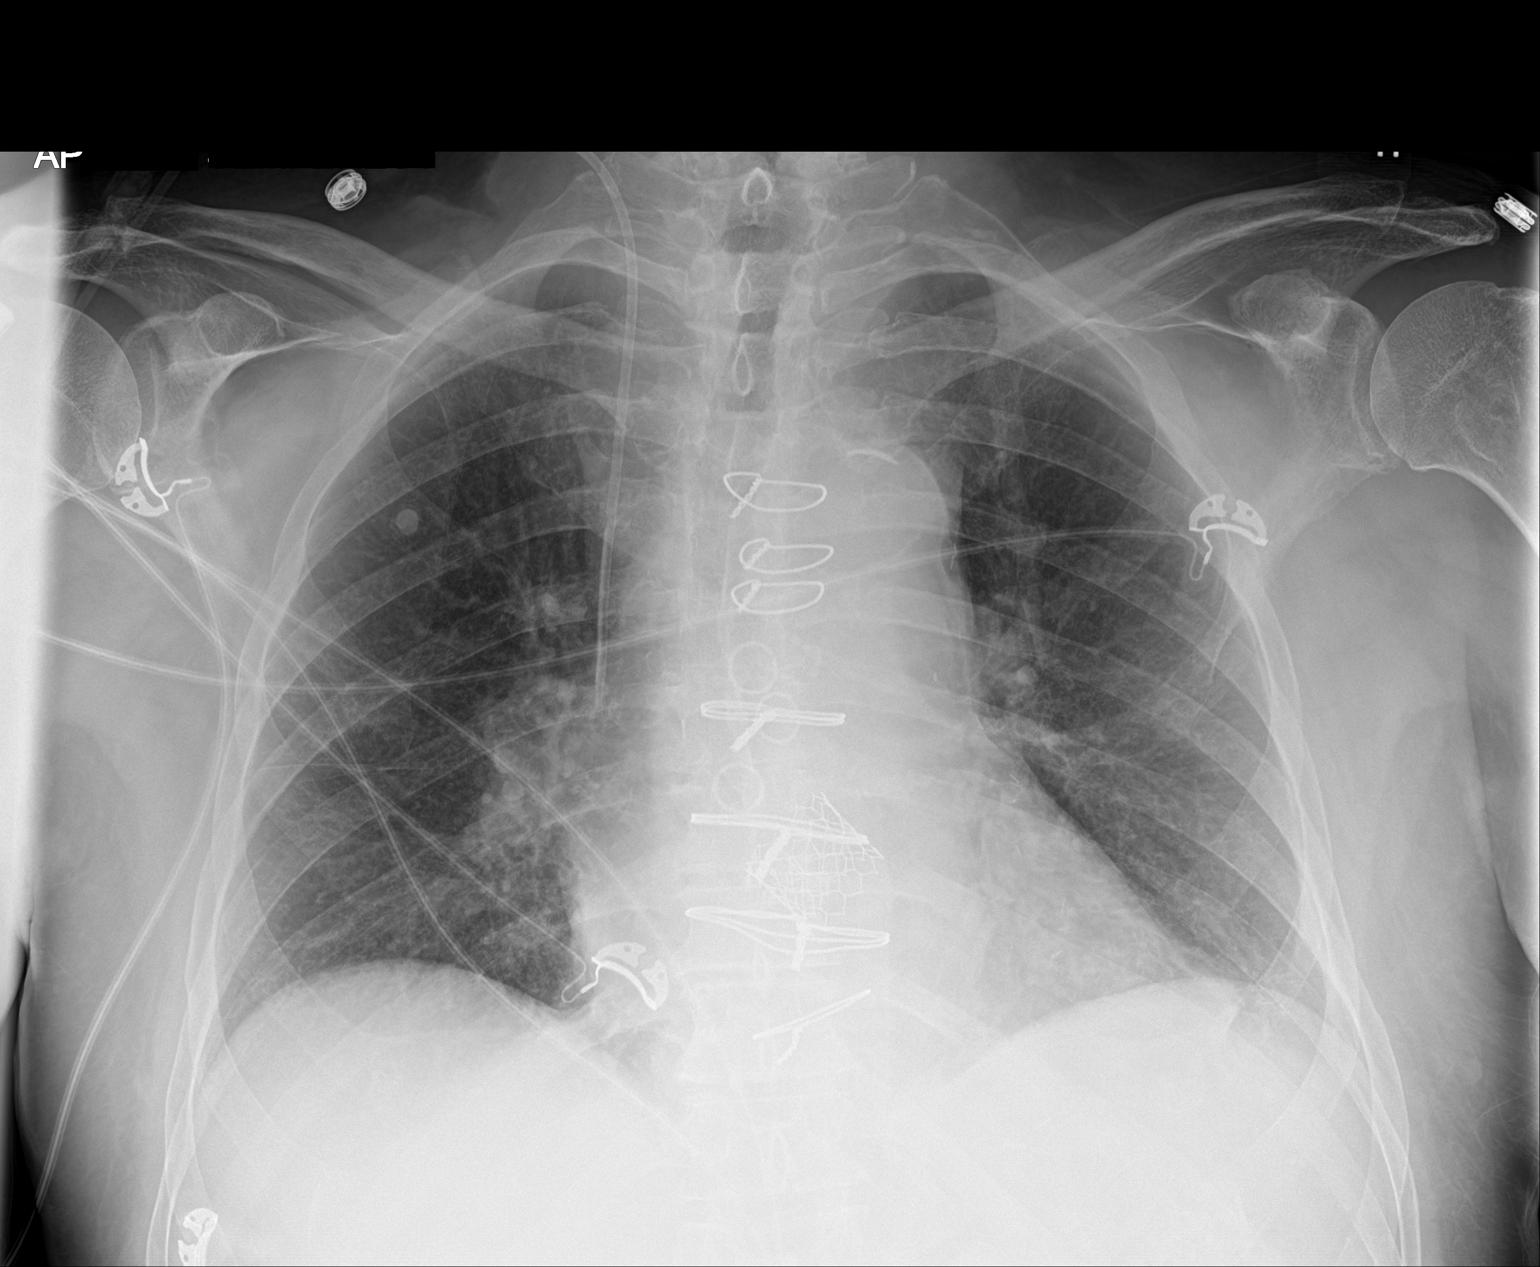

[1 of 1 positions shown; findings below may reference images not displayed]

FINDINGS: The lungs are well-expanded. The pulmonary interstitial markings are
less conspicuous today. There is no pleural effusion. There is a
stable approximately 8 mm diameter calcified nodule projecting in
the inferior aspect of the right upper lobe. The heart is top-normal
in size. The pulmonary vascularity is not engorged. There is
calcification in the wall of the aortic arch. The sternal wires are
intact. The aortic valve cage is in stable position. The right
internal jugular venous catheter tip projects over the midportion of
the SVC.
IMPRESSION: Stable appearance of the prosthetic aortic valve cage. Interval
resolution of mild interstitial edema. No pneumonia. Previous CABG.

Thoracic aortic atherosclerosis.

## 2018-05-12 ENCOUNTER — Other Ambulatory Visit: Payer: Self-pay

## 2018-05-12 DIAGNOSIS — Z952 Presence of prosthetic heart valve: Secondary | ICD-10-CM

## 2018-05-12 DIAGNOSIS — I350 Nonrheumatic aortic (valve) stenosis: Secondary | ICD-10-CM

## 2018-06-12 ENCOUNTER — Ambulatory Visit: Payer: Medicare Other | Admitting: Physician Assistant

## 2018-06-12 ENCOUNTER — Other Ambulatory Visit (HOSPITAL_COMMUNITY): Payer: Medicare Other

## 2018-06-26 ENCOUNTER — Ambulatory Visit (INDEPENDENT_AMBULATORY_CARE_PROVIDER_SITE_OTHER): Payer: Medicare Other | Admitting: Physician Assistant

## 2018-06-26 ENCOUNTER — Other Ambulatory Visit: Payer: Self-pay

## 2018-06-26 ENCOUNTER — Ambulatory Visit (HOSPITAL_COMMUNITY): Payer: Medicare Other | Attending: Cardiovascular Disease

## 2018-06-26 ENCOUNTER — Encounter: Payer: Self-pay | Admitting: Physician Assistant

## 2018-06-26 VITALS — BP 144/70 | HR 58 | Ht 70.0 in | Wt 184.4 lb

## 2018-06-26 DIAGNOSIS — I480 Paroxysmal atrial fibrillation: Secondary | ICD-10-CM | POA: Diagnosis not present

## 2018-06-26 DIAGNOSIS — Z952 Presence of prosthetic heart valve: Secondary | ICD-10-CM | POA: Diagnosis not present

## 2018-06-26 DIAGNOSIS — I10 Essential (primary) hypertension: Secondary | ICD-10-CM | POA: Insufficient documentation

## 2018-06-26 DIAGNOSIS — Z48812 Encounter for surgical aftercare following surgery on the circulatory system: Secondary | ICD-10-CM | POA: Insufficient documentation

## 2018-06-26 DIAGNOSIS — I2510 Atherosclerotic heart disease of native coronary artery without angina pectoris: Secondary | ICD-10-CM

## 2018-06-26 DIAGNOSIS — I071 Rheumatic tricuspid insufficiency: Secondary | ICD-10-CM | POA: Insufficient documentation

## 2018-06-26 DIAGNOSIS — I4891 Unspecified atrial fibrillation: Secondary | ICD-10-CM | POA: Insufficient documentation

## 2018-06-26 DIAGNOSIS — I350 Nonrheumatic aortic (valve) stenosis: Secondary | ICD-10-CM

## 2018-06-26 DIAGNOSIS — I119 Hypertensive heart disease without heart failure: Secondary | ICD-10-CM | POA: Diagnosis not present

## 2018-06-26 MED ORDER — PERFLUTREN LIPID MICROSPHERE
1.0000 mL | INTRAVENOUS | Status: AC | PRN
  Administered 2018-06-26: 2 mL via INTRAVENOUS

## 2018-06-26 NOTE — Patient Instructions (Signed)
Medication Instructions:  Your physician recommends that you continue on your current medications as directed. Please refer to the Current Medication list given to you today.   Labwork: none  Testing/Procedures: none  Follow-Up: Follow up with Dr. Donnie Ahoilley as planned.     Any Other Special Instructions Will Be Listed Below (If Applicable).     If you need a refill on your cardiac medications before your next appointment, please call your pharmacy.

## 2018-06-26 NOTE — Progress Notes (Addendum)
[facility]   MULTIDISCIPLINARY HEART VALVE CLINIC                                       Cardiology Office Note    Date:  06/27/2018   ID:  Thomas Diaz, DOB September 24, 1932, MRN [id number]  PCP:  Othella Boyerilley, William S, MD  Cardiologist: Dr. Donnie Ahoilley / Dr. Excell Seltzerooper & Dr. Cornelius Moraswen (TAVR)  CC: 1 year s/p TAVR   History of Present Illness:  Thomas CelesteLester C Grealish is a 84 y.o. male with a history of CAD s/p CABG (2009), HTN, HLD, PAF on amiodarone and Xarelto who presents to clinic for follow up.   He underwent successful TAVR via the TF approach on 06/18/18. 1 month post op echo showed normally functioning TAVR valve with mean gradient 7 mm Hg and trivial PVL.  Today he presents to clinic for follow up. He has been doing great. He exercises 4x a week at his living facility. He walks to see his wife in the memory unit multiple times a day. No CP or SOB. No LE edema, orthopnea or PND. No dizziness or syncope. No blood in stool or urine. No palpitations.    Past Medical History:  Diagnosis Date  . Aortic stenosis 07/06/2013   Echocardiogram on 06/19/13 and showed mild to moderate aortic stenosis.  His ejection fraction is 55-60%. >> s/p TAVR 05/2017  . BPH (benign prostatic hyperplasia) 05/30/2011  . CAD (coronary artery disease), native coronary artery 03/07/2017   Cath 2009 with 60% LM and 95% RCA  CABG 6//19/09 with LIMA to LAD, SVG to RCA, SVG to int-OM and SVG to Dx Dr. Tyrone SageGerhardt  . ED (erectile dysfunction)   . 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 Gout   . Heart murmur    had all his life  . Hyperlipidemia   . Hypertension   . OA (osteoarthritis)   . PAC (premature atrial contraction)   . Persistent atrial fibrillation (HCC) 06/16/2013   CHA2DS2VASC score 4 Cardioversion 03/07/17 with reversion in one week Cardioversion 04/10/17 after loading with amiodarone with sinus   . S/P CABG x 5 05/12/2008   CABG x 5 - LIMA to LAD, SVG to Diag, Sequential SVG to ramus-OM1,  SVG to RCA - Dr Tyrone SageGerhardt  . 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 CARDIOVASCULAR STRESS TEST  11/03/04   EF 66%  . CARDIOVERSION N/A 03/07/2017   Procedure: CARDIOVERSION;  Surgeon: Othella BoyerWilliam S Tilley, MD;  [facility];  Service: Cardiovascular;  Laterality: N/A;  . CARDIOVERSION N/A 04/10/2017   Procedure: CARDIOVERSION;  Surgeon: Othella Boyerilley, William S, MD;  [facility];  Service: Cardiovascular;  Laterality: N/A;  . COLONOSCOPY  2000  . CORONARY ARTERY BYPASS GRAFT  04/2008  . RIGHT/LEFT HEART CATH AND CORONARY/GRAFT ANGIOGRAPHY N/A 05/09/2017   Procedure: Right/Left Heart Cath and Coronary/Graft Angiography;  Surgeon: Lennette BihariKelly, Thomas A, MD;  [facility];  Service: Cardiovascular;  Laterality: N/A;  . TEE WITHOUT CARDIOVERSION N/A 06/18/2017   Procedure: TRANSESOPHAGEAL ECHOCARDIOGRAM (TEE);  Surgeon: Tonny Bollmanooper, Michael, MD;  [facility];  Service: Open Heart Surgery;  Laterality: N/A;  . TONSILLECTOMY AND ADENOIDECTOMY    . TRANSCATHETER  AORTIC VALVE REPLACEMENT, TRANSFEMORAL N/A 06/18/2017   Procedure: TRANSCATHETER AORTIC VALVE REPLACEMENT, TRANSFEMORAL using an Edwards 26 mm Sapien 3 Aortic Valve;  Surgeon: Tonny Bollman, MD;  Location: [facility] OR;  Service: Open Heart Surgery;  Laterality: N/A;    Current Medications: Outpatient Medications Prior to Visit  Medication Sig Dispense Refill  . amiodarone (PACERONE) 200 MG tablet Take 200 mg by mouth daily.    . diazepam (VALIUM) 5 MG tablet Take 5 mg by mouth at bedtime as needed (sleep).    . metoprolol tartrate (LOPRESSOR) 25 MG tablet Take 0.5 tablets (12.5 mg total) by mouth 2 (two) times daily. 60 tablet 1  . rosuvastatin (CRESTOR) 5 MG tablet Take 5 mg by mouth 3 (three) times a week.    Carlena Hurl 10 MG TABS tablet Take 10 mg  by mouth 2 (two) times daily.    Marland Kitchen losartan (COZAAR) 50 MG tablet Take 1 tablet (50 mg total) by mouth daily. 90 tablet 3  . aspirin 81 MG EC tablet Take 1 tablet (81 mg total) by mouth daily. (Patient not taking: Reported on 06/26/2018) 30 tablet 1   No facility-administered medications prior to visit.      Allergies:   Patient has no active allergies.   Social History   Socioeconomic History  . Marital status: Married    Spouse name: Not on file  . Number of children: Not on file  . Years of education: Not on file  . Highest education level: Not on file  Occupational History  . Not on file  Social Needs  . Financial resource strain: Not on file  . Food insecurity:    Worry: Not on file    Inability: Not on file  . Transportation needs:    Medical: Not on file    Non-medical: Not on file  Tobacco Use  . Smoking status: Never Smoker  . Smokeless tobacco: Never Used  Substance and Sexual Activity  . Alcohol use: No  . Drug use: No  . Sexual activity: Not on file  Lifestyle  . Physical activity:    Days per week: Not on file    Minutes per session: Not on file  . Stress: Not on file  Relationships  . Social connections:    Talks on phone: Not on file    Gets together: Not on file    Attends religious service: Not on file    Active member of club or organization: Not on file    Attends meetings of clubs or organizations: Not on file    Relationship status: Not on file  Other Topics Concern  . Not on file  Social History Narrative  . Not on file     Family History:  The patient's family history includes Heart attack in his brother and father.      ROS:   Please see the history of present illness.    ROS All other systems reviewed and are negative.   PHYSICAL EXAM:   VS:  BP (!) 144/70   Pulse (!) 58   Ht 5\' 10"  (1.778 m)   Wt 184 lb 6.4 oz (83.6 kg)   SpO2 96%   BMI 26.46 kg/m    GEN: Well nourished, well developed, in no acute distress  HEENT: normal    Neck: no JVD, carotid bruits, or masses Cardiac: RRR; no murmurs, rubs, or gallops,no edema  Respiratory:  clear to auscultation bilaterally, normal work of breathing GI: soft, nontender, nondistended, + BS  MS: no deformity or atrophy  Skin: warm and dry, no rash Neuro:  Alert and Oriented x 3, Strength and sensation are intact Psych: euthymic mood, full affect  Wt Readings from Last 3 Encounters:  06/26/18 184 lb 6.4 oz (83.6 kg)  07/15/17 176 lb 3.2 oz (79.9 kg)  06/28/17 175 lb (79.4 kg)      Studies/Labs Reviewed:   EKG:  EKG is NOT ordered today.    Recent Labs: No results found for requested labs within last 8760 hours.   Lipid Panel    Component Value Date/Time   CHOL 141 04/13/2015 0947   TRIG 73.0 04/13/2015 0947   HDL 55.00 04/13/2015 0947   CHOLHDL 3 04/13/2015 0947   VLDL 14.6 04/13/2015 0947   LDLCALC 71 04/13/2015 0947    Additional studies/ records that were reviewed today include:  TAVR OPERATIVE NOTE   Date of Procedure:                06/18/2017  Preoperative Diagnosis:      Severe Aortic Stenosis  Procedure:       Transcatheter Aortic Valve Replacement - Percutaneous Transfemoral Approach             Edwards Sapien 3 THV (size 26 mm, model # 9600TFX, serial # [id number])              Co-Surgeons:                        Salvatore Decent. Cornelius Moras, MD and Tonny Bollman, MD  Pre-operative Echo Findings: ? Severe aortic stenosis ? Normal left ventricular systolic function  Post-operative Echo Findings: ? Trivial paravalvular leak ? normal left ventricular systolic function  _____________    2D ECHO 07/15/17 ( 1 month s/p TAVR) Study Conclusions - Left ventricle: The cavity size was normal. There was mild   concentric hypertrophy. Systolic function was normal. Wall motion   was normal; there were no regional wall motion abnormalities.   Left ventricular diastolic function parameters were normal. - Aortic valve: S/P TAVR with AV bioprosthesis  functioning   normally. There is very trivial perivavluar regurgitation see   only in the apical 3 chamber view and mean AVG is normal at   . Mean gradient (S): 7 mm Hg. Peak gradient (S): 14 mm Hg. - Mitral valve: Mild focal calcification of the anterior leaflet.   There was mild regurgitation. - Left atrium: The atrium was mildly dilated. - Atrial septum: There was increased thickness of the septum,   consistent with lipomatous hypertrophy. - Pulmonic valve: There was trivial regurgitation.  __________________   2D ECHO 06/26/18 (1 year s/p TAVR) Study Conclusions  - Procedure narrative: Transthoracic echocardiography. Image   quality was suboptimal. The study was technically difficult.   Intravenous contrast (Definity) was administered to opacify the   LV. - Left ventricle: The cavity size was normal. Systolic function was   normal. The estimated ejection fraction was in the range of 55%   to 60%. Hypokinesis of the anteroseptal myocardium. Doppler   parameters are consistent with a reversible restrictive pattern,   indicative of decreased left ventricular diastolic compliance   and/or increased left atrial pressure (grade 3 diastolic   dysfunction). Doppler parameters are consistent with high   ventricular filling pressure. - Aortic valve: A 26mm Edwards Sapien 3 TAVR bioprosthesis was   present. There was mild stenosis. There was trivial   regurgitation. Mean gradient (S): 20 mm Hg. Peak  gradient (S): 40   mm Hg. - Mitral valve: Transvalvular velocity was within the normal range.   There was no evidence for stenosis. There was trivial   regurgitation. - Left atrium: The atrium was severely dilated. - Right ventricle: The cavity size was normal. Wall thickness was   normal. Systolic function was normal. - Tricuspid valve: There was mild regurgitation. - Pulmonary arteries: Systolic pressure was within the normal   range. PA peak pressure: 33 mm Hg  (S).   ASSESSMENT & PLAN:   Severe AS s/p TAVR: he is doing great. 2D ECHO today shows EF 55%, normally functioning TAVR valve with mild stenosis; mean gradient 20 mm Hg; no PVL. He has NHYA class I symptoms. He stays very active with no issues. He has Amoxicillin for SBE prophylaxis.   HTN: BP well controlled today  PAF: sounds regular on exam. Continue amiodarone. He takes Xarelto 10mg  daily. I have explained to him that according to his creat clearance of 61, he really should be on 20mg  daily. He understands he is not fully protected from a stroke but likes to take 10 mg because it lasts longer and it's less expensive. He says "something has to kill him."  CAD: continue medical therapy. No ASA given Xarelto use.    ADDENDUM: Dr. Excell Seltzer reviewed echo. Given increase in gradient across Aov (7 mm Hg--> 20 mm Hg) we will repeat an echo at 6 month to ensure gradients remain stable. Encouraged correct dose of Xarelto 20mg  daily.    Medication Adjustments/Labs and Tests Ordered: Current medicines are reviewed at length with the patient today.  Concerns regarding medicines are outlined above.  Medication changes, Labs and Tests ordered today are listed in the Patient Instructions below. Patient Instructions  Medication Instructions:  Your physician recommends that you continue on your current medications as directed. Please refer to the Current Medication list given to you today.   Labwork: none  Testing/Procedures: none  Follow-Up: Follow up with Dr. Donnie Aho as planned.     Any Other Special Instructions Will Be Listed Below (If Applicable).     If you need a refill on your cardiac medications before your next appointment, please call your pharmacy.      Signed, Cline Crock, PA-C  06/27/2018 9:44 AM    [facility] [address] [place], [place], [place]  [address] Phone: [phone]; Fax: [phone]

## 2018-06-30 ENCOUNTER — Telehealth: Payer: Self-pay

## 2018-06-30 NOTE — Telephone Encounter (Signed)
Notes recorded by Sigurd Sosapp, Desma Wilkowski, RN on 06/30/2018 at 1:11 PM EDT Spoke to patient per Cline CrockKathryn Thompson. He verbalized understanding.

## 2018-07-03 ENCOUNTER — Encounter: Payer: Self-pay | Admitting: Thoracic Surgery (Cardiothoracic Vascular Surgery)

## 2018-11-02 NOTE — Progress Notes (Signed)
Cardiology Office Note:    Date:  11/03/2018   ID:  Thomas Diaz, DOB [birth year], MRN [id number]  PCP:  Othella Boyerilley, William S, MD  Cardiologist:  Norman HerrlichBrian Kylani Wires, MD    Referring MD: Othella Boyerilley, William S, MD    ASSESSMENT:    1. Paroxysmal atrial fibrillation (HCC)   2. Coronary artery disease of native artery of native heart with stable angina pectoris (HCC)   3. S/P TAVR (transcatheter aortic valve replacement)   4. Long term current use of anticoagulant therapy   5. On amiodarone therapy   6. Benign hypertensive heart disease without heart failure    PLAN:    In order of problems listed above:  1. He is in rate controlled atrial fibrillation not particularly symptomatic we will bump his amiodarone to 400 mg a day check levels recheck in 2 weeks and if he continues in atrial fibrillation pursue cardioversion.  With structural heart disease and age I would hold on referral for EP ablation at this time.  He will continue his beta-blocker for rate control and anticoagulant 2. Stable CAD 3. Stable 4. Continue his current anticoagulant he is taking 20 mg a day of Xarelto 5. Check levels check liver function and TSH for toxicity increase dose and reassess in 2 weeks 6. Stable recheck blood pressure office follow-up in 2 weeks and may require additional therapy   Next appointment: 2 weeks   Medication Adjustments/Labs and Tests Ordered: Current medicines are reviewed at length with the patient today.  Concerns regarding medicines are outlined above.  No orders of the defined types were placed in this encounter.  No orders of the defined types were placed in this encounter.   Chief Complaint  Patient presents with  . Follow-up  . Coronary Artery Disease  . Atrial Fibrillation  . Hypertension  . Hyperlipidemia    History of Present Illness:    Thomas Diaz is a 84 y.o. male with a hx of CAD, CABG 2009 hypertension, hyperlipidemia atrial fibrillation maintaining sinus rhythm  on amiodarone aortic stenosis and TAVR 05/31/2017 last seen by Dr. Donnie Ahoilley 02/14/2018.  At that time he is in sinus rhythm continue amiodarone and consider doing well not having cardiovascular symptoms with his CAD and TAVR.  His most recent lipid profile cholesterol 151 HDL 51 LDL 79.  Compliance with diet, lifestyle and medications: Yes  He is seen today as he thinks he is in atrial fibrillation.  He is notices pulse more rapid and irregular will quickly check an EKG in the office he is noted no change in his exercise tolerance no chest pain shortness of breath edema or syncope.  He is taking 20 mg a day of Xarelto as a single dose I corrected his medications and no bleeding complication he continues on low-dose amiodarone Past Medical History:  Diagnosis Date  . Aortic stenosis 07/06/2013   Echocardiogram on 06/19/13 and showed mild to moderate aortic stenosis.  His ejection fraction is 55-60%. >> s/p TAVR 05/2017  . BPH (benign prostatic hyperplasia) 05/30/2011  . CAD (coronary artery disease), native coronary artery 03/07/2017   Cath 2009 with 60% LM and 95% RCA  CABG 6//19/09 with LIMA to LAD, SVG to RCA, SVG to int-OM and SVG to Dx Dr. Tyrone SageGerhardt  . ED (erectile dysfunction)   . 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 Gout   . Heart murmur    had all his life  . Hyperlipidemia   .  Hypertension   . OA (osteoarthritis)   . PAC (premature atrial contraction)   . Persistent atrial fibrillation 06/16/2013   CHA2DS2VASC score 4 Cardioversion 03/07/17 with reversion in one week Cardioversion 04/10/17 after loading with amiodarone with sinus   . S/P CABG x 5 05/12/2008   CABG x 5 - LIMA to LAD, SVG to Diag, Sequential SVG to ramus-OM1, SVG to RCA - Dr Tyrone Sage  . 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 CARDIOVASCULAR STRESS TEST  11/03/04   EF 66%  . CARDIOVERSION N/A 03/07/2017   Procedure: CARDIOVERSION;  Surgeon: [name], MD;  Location: [facility] ENDOSCOPY;  Service: Cardiovascular;  Laterality: N/A;  . CARDIOVERSION N/A 04/10/2017   Procedure: CARDIOVERSION;  Surgeon: [name], MD;  Location: [facility] ENDOSCOPY;  Service: Cardiovascular;  Laterality: N/A;  . COLONOSCOPY  2000  . CORONARY ARTERY BYPASS GRAFT  04/2008  . RIGHT/LEFT HEART CATH AND CORONARY/GRAFT ANGIOGRAPHY N/A 05/09/2017   Procedure: Right/Left Heart Cath and Coronary/Graft Angiography;  Surgeon: Lennette Bihari, MD;  Location: [facility] INVASIVE CV LAB;  Service: Cardiovascular;  Laterality: N/A;  . TEE WITHOUT CARDIOVERSION N/A 06/18/2017   Procedure: TRANSESOPHAGEAL ECHOCARDIOGRAM (TEE);  Surgeon: Tonny Bollman, MD;  Location: [facility] OR;  Service: Open Heart Surgery;  Laterality: N/A;  . TONSILLECTOMY AND ADENOIDECTOMY    . TRANSCATHETER AORTIC VALVE REPLACEMENT, TRANSFEMORAL N/A 06/18/2017   Procedure: TRANSCATHETER AORTIC VALVE REPLACEMENT, TRANSFEMORAL using an Edwards 26 mm Sapien 3 Aortic Valve;  Surgeon: Tonny Bollman, MD;  Location: [facility] OR;  Service: Open Heart Surgery;  Laterality: N/A;    Current Medications: Current Meds  Medication Sig  . amiodarone (PACERONE) 200 MG tablet Take 200 mg by mouth daily.  . diazepam (VALIUM) 5 MG tablet Take 5 mg by mouth at bedtime as needed (sleep).  Marland Kitchen ipratropium (ATROVENT) 0.06 % nasal spray Place into the nose.  . losartan (COZAAR) 50 MG tablet Take 1 tablet (50 mg total) by mouth daily.  . metoprolol tartrate (LOPRESSOR) 25 MG tablet Take 0.5 tablets (12.5 mg total) by mouth 2 (two) times daily. (Patient taking differently: Take 25 mg by mouth 2 (two) times daily. )  . rosuvastatin (CRESTOR) 5 MG tablet Take 5 mg by mouth 3 (three) times a week.  Carlena Hurl 20 MG TABS tablet Take 20 mg by mouth daily.   . [DISCONTINUED] metoprolol tartrate (LOPRESSOR) 25 MG tablet Take  by mouth.     Allergies:   Patient has no known allergies.   Social History   Socioeconomic History  . Marital status: Married    Spouse name: Not on file  . Number of children: Not on file  . Years of education: Not on file  . Highest education level: Not on file  Occupational History  . Not on file  Social Needs  . Financial resource strain: Not on file  . Food insecurity:    Worry: Not on file    Inability: Not on file  . Transportation needs:    Medical: Not on file    Non-medical: Not on file  Tobacco Use  . Smoking status: Never Smoker  . Smokeless tobacco: Never Used  Substance and Sexual Activity  . Alcohol use: No  . Drug use: No  . Sexual activity: Not on file  Lifestyle  . Physical activity:  Days per week: Not on file    Minutes per session: Not on file  . Stress: Not on file  Relationships  . Social connections:    Talks on phone: Not on file    Gets together: Not on file    Attends religious service: Not on file    Active member of club or organization: Not on file    Attends meetings of clubs or organizations: Not on file    Relationship status: Not on file  Other Topics Concern  . Not on file  Social History Narrative  . Not on file     Family History: The patient's family history includes Heart attack in his brother and father. ROS:   Please see the history of present illness.    All other systems reviewed and are negative.  EKGs/Labs/Other Studies Reviewed:    The following studies were reviewed today:  EKG:  EKG ordered today.  The ekg ordered today demonstrates rate controlled atrial fibrillation  Recent Labs: No results found for requested labs within last 8760 hours.  Recent Lipid Panel    Component Value Date/Time   CHOL 141 04/13/2015 0947   TRIG 73.0 04/13/2015 0947   HDL 55.00 04/13/2015 0947   CHOLHDL 3 04/13/2015 0947   VLDL 14.6 04/13/2015 0947   LDLCALC 71 04/13/2015 0947    Physical Exam:    VS:  BP (!)  150/90 (BP Location: Right Arm, Patient Position: Sitting, Cuff Size: Normal)   Pulse 78   Ht 5\' 10"  (1.778 m)   Wt 183 lb (83 kg)   SpO2 96%   BMI 26.26 kg/m     Wt Readings from Last 3 Encounters:  11/03/18 183 lb (83 kg)  06/26/18 184 lb 6.4 oz (83.6 kg)  07/15/17 176 lb 3.2 oz (79.9 kg)     GEN:  Well nourished, well developed in no acute distress HEENT: Normal NECK: No JVD; No carotid bruits LYMPHATICS: No lymphadenopathy CARDIAC: RRR, no murmurs, rubs, gallops RESPIRATORY:  Clear to auscultation without rales, wheezing or rhonchi  ABDOMEN: Soft, non-tender, non-distended MUSCULOSKELETAL:  No edema; No deformity  SKIN: Warm and dry NEUROLOGIC:  Alert and oriented x 3 PSYCHIATRIC:  Normal affect    Signed, Norman Herrlich, MD  11/03/2018 3:43 PM    [facility]

## 2018-11-03 ENCOUNTER — Encounter: Payer: Self-pay | Admitting: Cardiology

## 2018-11-03 ENCOUNTER — Ambulatory Visit: Payer: Medicare Other | Admitting: Cardiology

## 2018-11-03 ENCOUNTER — Ambulatory Visit (INDEPENDENT_AMBULATORY_CARE_PROVIDER_SITE_OTHER): Payer: Medicare Other | Admitting: Cardiology

## 2018-11-03 VITALS — BP 150/90 | HR 78 | Ht 70.0 in | Wt 183.0 lb

## 2018-11-03 DIAGNOSIS — I119 Hypertensive heart disease without heart failure: Secondary | ICD-10-CM

## 2018-11-03 DIAGNOSIS — I480 Paroxysmal atrial fibrillation: Secondary | ICD-10-CM | POA: Diagnosis not present

## 2018-11-03 DIAGNOSIS — Z79899 Other long term (current) drug therapy: Secondary | ICD-10-CM

## 2018-11-03 DIAGNOSIS — I25118 Atherosclerotic heart disease of native coronary artery with other forms of angina pectoris: Secondary | ICD-10-CM

## 2018-11-03 DIAGNOSIS — Z952 Presence of prosthetic heart valve: Secondary | ICD-10-CM | POA: Diagnosis not present

## 2018-11-03 DIAGNOSIS — Z7901 Long term (current) use of anticoagulants: Secondary | ICD-10-CM

## 2018-11-03 MED ORDER — AMIODARONE HCL 200 MG PO TABS: 200.0000 mg | ORAL_TABLET | Freq: Two times a day (BID) | ORAL | 5 refills | Status: DC

## 2018-11-03 NOTE — Patient Instructions (Addendum)
  Medication Instructions:  Your physician has recommended you make the following change in your medication:   INCREASE AMIODARONE TO 200 MG TWO TIMES A DAY  If you need a refill on your cardiac medications before your next appointment, please call your pharmacy.   Lab work:Your physician recommends that you have lab work today: Amiodarone level, CMP, TSH, Lipids   If you have labs (blood work) drawn today and your tests are completely normal, you will receive your results only by: Marland Kitchen. MyChart Message (if you have MyChart) OR . A paper copy in the mail If you have any lab test that is abnormal or we need to change your treatment, we will call you to review the results.  Testing/Procedures: None Follow-Up: At [facility], you and your health needs are our priority.  As part of our continuing mission to provide you with exceptional heart care, we have created designated Provider Care Teams.  These Care Teams include your primary Cardiologist (physician) and Advanced Practice Providers (APPs -  Physician Assistants and Nurse Practitioners) who all work together to provide you with the care you need, when you need it. You will need a follow up appointment in 2 weeks.  KardiaMobile [email or web address]        FDA-cleared, clinical grade mobile EKG monitor: Lourena SimmondsKardia is the most clinically-validated mobile EKG used by the world's leading cardiac care medical professionals With Basic service, know instantly if your heart rhythm is normal or if atrial fibrillation is detected, and email the last single EKG recording to yourself or your doctor Premium service, available for purchase through the Kardia app for $9.99 per month or $99 per year, includes unlimited history and storage of your EKG recordings, a monthly EKG summary report to share with your doctor, along with the ability to track your blood pressure, activity and weight Includes one KardiaMobile phone clip FREE  SHIPPING: Standard delivery 1-3 business days. Orders placed by 11:00am PST will ship that afternoon. Otherwise, will ship next business day. All orders ship via [employer] Priority Mail from [place]

## 2018-11-05 LAB — TSH: TSH: 1.4 u[IU]/mL (ref 0.450–4.500)

## 2018-11-05 LAB — COMPREHENSIVE METABOLIC PANEL
ALBUMIN: 4.5 g/dL (ref 3.5–4.7)
ALT: 27 IU/L (ref 0–44)
AST: 30 IU/L (ref 0–40)
Albumin/Globulin Ratio: 1.7 (ref 1.2–2.2)
Alkaline Phosphatase: 65 IU/L (ref 39–117)
BUN / CREAT RATIO: 13 (ref 10–24)
BUN: 17 mg/dL (ref 8–27)
Bilirubin Total: 1.9 mg/dL — ABNORMAL HIGH (ref 0.0–1.2)
CALCIUM: 9.5 mg/dL (ref 8.6–10.2)
CHLORIDE: 105 mmol/L (ref 96–106)
CO2: 25 mmol/L (ref 20–29)
CREATININE: 1.26 mg/dL (ref 0.76–1.27)
GFR, EST AFRICAN AMERICAN: 60 mL/min/{1.73_m2} (ref >59)
GFR, EST NON AFRICAN AMERICAN: 52 mL/min/{1.73_m2} — AB (ref >59)
GLUCOSE: 94 mg/dL (ref 65–99)
Globulin, Total: 2.6 g/dL (ref 1.5–4.5)
Potassium: 4.9 mmol/L (ref 3.5–5.2)
Sodium: 145 mmol/L — ABNORMAL HIGH (ref 134–144)
TOTAL PROTEIN: 7.1 g/dL (ref 6.0–8.5)

## 2018-11-05 LAB — AMIODARONE LEVEL
AMIODARONE LVL: 1 ug/mL (ref 1.0–2.5)
N-DESETHYL-AMIODARONE: 0.7 ug/mL — AB (ref 1.0–2.5)

## 2018-11-05 LAB — LIPID PANEL
CHOL/HDL RATIO: 2.8 ratio (ref 0.0–5.0)
Cholesterol, Total: 159 mg/dL (ref 100–199)
HDL: 57 mg/dL (ref >39)
LDL CALC: 78 mg/dL (ref 0–99)
Triglycerides: 122 mg/dL (ref 0–149)
VLDL CHOLESTEROL CAL: 24 mg/dL (ref 5–40)

## 2018-11-16 NOTE — Progress Notes (Addendum)
Cardiology Office Note:    Date:  11/17/2018   ID:  Thomas Diaz, DOB 04/19/1932, MRN [id number]  PCP:  Tilley, William S, MD  Cardiologist:  Brian Munley, MD    Referring MD: Tilley, William S, MD    ASSESSMENT:    1. Paroxysmal atrial fibrillation (HCC)   2. On amiodarone therapy   3. Long term current use of anticoagulant therapy    PLAN:    In order of problems listed above:  1. Unfortunately is failed antiarrhythmic therapy with amiodarone continue low-dose until seen by EP for decision regarding alternate drug therapy Tikosyn or pulmonary vein isolation.  Continue his anticoagulant.  Despite age he is a very vigorous active otherwise healthy man 2. Reduce amiodarone to 200 mg/day no evidence of toxicity 3. Blood pressure is improved normal ranges compared to previous visits 4. Continue his current anticoagulant  He followed back to the office has decided not to pursue EP consultation or interventions are requested attempted cardioversion to resume sinus rhythm on his amiodarone.  He is scheduled for cardioversion 12/10/2018 with Dr. Turner and is compliant with his anticoagulant and his antiarrhythmic drug.   Next appointment: as needed   Medication Adjustments/Labs and Tests Ordered: Current medicines are reviewed at length with the patient today.  Concerns regarding medicines are outlined above.  No orders of the defined types were placed in this encounter.  No orders of the defined types were placed in this encounter.   Chief Complaint  Patient presents with  . Follow-up    AF after increasing amiodarone for 2 weeks    History of Present Illness:    Thomas Diaz is a 84 y.o. male with a hx of CAD, CABG 2009 hypertension, hyperlipidemia atrial fibrillation maintaining sinus rhythm on amiodarone aortic stenosis and TAVR 05/31/2017 last seen by Dr. Tilley 02/14/2018.  At that time he is in sinus rhythm continue amiodarone and consider doing well not having  cardiovascular symptoms with his CAD and TAVR.  He was seen by me in the office 11/03/2018 Dr. Tilley's absence and was found to be in rate controlled atrial fibrillation we increased the dose of his amiodarone and decided to take a conservative approach and reassess in 2 weeks to see if he would resume sinus rhythm.  He was continued on beta-blocker for rate control and his anticoagulant.  Laboratory test showed a subtherapeutic noramiodarone level with normal thyroid and liver function.  He has stable mild CKD creatinine 1.26.  Plan was for you arrange for outpatient cardioversion of atrial fibrillation persisted.  His chronic amiodarone dose was quite low at 100 mg/day  Compliance with diet, lifestyle and medications: Yes  He is seen today he is back in atrial fibrillation he is aware of it he has lost some of his strength and endurance.  We discussed whether to remain atrial fibrillation rate control he is uncomfortable whether to do repeated cardioversion he prefers not to whether refer for EP for evaluation for PVI and this is his choice.  In the interim we will reduce his amiodarone to 200 mg/day continue his anticoagulant is scheduled to be seen by electro physiology.  He has had no chest pain syncope shortness of breath or edema. Past Medical History:  Diagnosis Date  . Aortic stenosis 07/06/2013   Echocardiogram on 06/19/13 and showed mild to moderate aortic stenosis.  His ejection fraction is 55-60%. >> s/p TAVR 05/2017  . BPH (benign prostatic hyperplasia) 05/30/2011  . CAD (coronary artery   disease), native coronary artery 03/07/2017   Cath 2009 with 60% LM and 95% RCA  CABG 6//19/09 with LIMA to LAD, SVG to RCA, SVG to int-OM and SVG to Dx Dr. Gerhardt  . ED (erectile dysfunction)   . Gilbert syndrome 12/16/2015   patient has never been told this  . Gout   . Heart murmur    had all his life  . Hyperlipidemia   . Hypertension   . OA (osteoarthritis)   . PAC (premature atrial contraction)    . Persistent atrial fibrillation 06/16/2013   CHA2DS2VASC score 4 Cardioversion 03/07/17 with reversion in one week Cardioversion 04/10/17 after loading with amiodarone with sinus   . S/P CABG x 5 05/12/2008   CABG x 5 - LIMA to LAD, SVG to Diag, Sequential SVG to ramus-OM1, SVG to RCA - Dr Gerhardt  . 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 CARDIOVASCULAR STRESS TEST  11/03/04   EF 66%  . CARDIOVERSION N/A 03/07/2017   Procedure: CARDIOVERSION;  Surgeon: William S Tilley, MD;  Location: [facility];  Service: Cardiovascular;  Laterality: N/A;  . CARDIOVERSION N/A 04/10/2017   Procedure: CARDIOVERSION;  Surgeon: Tilley, William S, MD;  Location: [facility]OS[facility];  Service: Cardiovascular;  Laterality: N/A;  . COLONOSCOPY  2000  . CORONARY ARTERY BYPASS GRAFT  04/2008  . RIGHT/LEFT HEART CATH AND CORONARY/GRAFT ANGIOGRAPHY N/A 05/09/2017   Procedure: Right/Left Heart Cath and Coronary/Graft Angiography;  Surgeon: Kelly, Thomas A, MD;  Location: [facility] INVASIVE CV LAB;  Service: Cardiovascular;  Laterality: N/A;  . TEE WITHOUT CARDIOVERSION N/A 06/18/2017   Procedure: TRANSESOPHAGEAL ECHOCARDIOGRAM (TEE);  Surgeon: Cooper, Michael, MD;  Location: [facility] OR;  Service: Open Heart Surgery;  Laterality: N/A;  . TONSILLECTOMY AND ADENOIDECTOMY    . TRANSCATHETER AORTIC VALVE REPLACEMENT, TRANSFEMORAL N/A 06/18/2017   Procedure: TRANSCATHETER AORTIC VALVE REPLACEMENT, TRANSFEMORAL using an Edwards 26 mm Sapien 3 Aortic Valve;  Surgeon: Cooper, Michael, MD;  Location: [facility] OR;  Service: Open Heart Surgery;  Laterality: N/A;    Current Medications: Current Meds  Medication Sig  . amiodarone (PACERONE) 200 MG tablet Take 1 tablet (200 mg total) by mouth 2 (two) times daily.  . diazepam (VALIUM) 5 MG tablet Take 5 mg by mouth at bedtime as  needed (sleep).  . ipratropium (ATROVENT) 0.06 % nasal spray Place 1 spray into the nose daily as needed.   . losartan (COZAAR) 50 MG tablet Take 1 tablet (50 mg total) by mouth daily.  . metoprolol tartrate (LOPRESSOR) 25 MG tablet Take 25 mg by mouth 2 (two) times daily.  . rosuvastatin (CRESTOR) 5 MG tablet Take 5 mg by mouth 3 (three) times a week.  . XARELTO 20 MG TABS tablet Take 20 mg by mouth daily.      Allergies:   Patient has no known allergies.   Social History   Socioeconomic History  . Marital status: Married    Spouse name: Not on file  . Number of children: Not on file  . Years of education: Not on file  . Highest education level: Not on file  Occupational History  . Not on file  Social Needs  . Financial resource strain: Not on file  . Food insecurity:    Worry: Not on file    Inability: Not on file  . Transportation needs:      Medical: Not on file    Non-medical: Not on file  Tobacco Use  . Smoking status: Never Smoker  . Smokeless tobacco: Never Used  Substance and Sexual Activity  . Alcohol use: No  . Drug use: No  . Sexual activity: Not on file  Lifestyle  . Physical activity:    Days per week: Not on file    Minutes per session: Not on file  . Stress: Not on file  Relationships  . Social connections:    Talks on phone: Not on file    Gets together: Not on file    Attends religious service: Not on file    Active member of club or organization: Not on file    Attends meetings of clubs or organizations: Not on file    Relationship status: Not on file  Other Topics Concern  . Not on file  Social History Narrative  . Not on file     Family History: The patient's family history includes Heart attack in his brother and father. ROS:   Please see the history of present illness.    All other systems reviewed and are negative.  EKGs/Labs/Other Studies Reviewed:    The following studies were reviewed today:  EKG:  EKG ordered today.  The ekg  ordered today demonstrates rate controlled AF  Recent Labs: 11/03/2018: ALT 27; BUN 17; Creatinine, Ser 1.26; Potassium 4.9; Sodium 145; TSH 1.400  Recent Lipid Panel    Component Value Date/Time   CHOL 159 11/03/2018 1612   TRIG 122 11/03/2018 1612   HDL 57 11/03/2018 1612   CHOLHDL 2.8 11/03/2018 1612   CHOLHDL 3 04/13/2015 0947   VLDL 14.6 04/13/2015 0947   LDLCALC 78 11/03/2018 1612    Physical Exam:    VS:  BP 130/84 (BP Location: Right Arm, Patient Position: Sitting, Cuff Size: Normal)   Pulse 87   Ht 5' 10" (1.778 m)   Wt 184 lb 1.9 oz (83.5 kg)   SpO2 96%   BMI 26.42 kg/m     Wt Readings from Last 3 Encounters:  11/17/18 184 lb 1.9 oz (83.5 kg)  11/03/18 183 lb (83 kg)  06/26/18 184 lb 6.4 oz (83.6 kg)     GEN:  Well nourished, well developed in no acute distress HEENT: Normal NECK: No JVD; No carotid bruits LYMPHATICS: No lymphadenopathy CARDIAC: IrrIrrvariable S1 , no murmurs, rubs, gallops RESPIRATORY:  Clear to auscultation without rales, wheezing or rhonchi  ABDOMEN: Soft, non-tender, non-distended MUSCULOSKELETAL:  No edema; No deformity  SKIN: Warm and dry NEUROLOGIC:  Alert and oriented x 3 PSYCHIATRIC:  Normal affect    Signed, Brian Munley, MD  11/17/2018 2:17 PM    [facility]  

## 2018-11-16 NOTE — H&P (View-Only) (Signed)
Cardiology Office Note:    Date:  11/17/2018   ID:  Thomas CelesteLester C Mancha, DOB [birth year], MRN [id number]  PCP:  Othella Boyerilley, William S, MD  Cardiologist:  Norman HerrlichBrian Munley, MD    Referring MD: Othella Boyerilley, William S, MD    ASSESSMENT:    1. Paroxysmal atrial fibrillation (HCC)   2. On amiodarone therapy   3. Long term current use of anticoagulant therapy    PLAN:    In order of problems listed above:  1. Unfortunately is failed antiarrhythmic therapy with amiodarone continue low-dose until seen by EP for decision regarding alternate drug therapy Tikosyn or pulmonary vein isolation.  Continue his anticoagulant.  Despite age he is a very vigorous active otherwise healthy man 2. Reduce amiodarone to 200 mg/day no evidence of toxicity 3. Blood pressure is improved normal ranges compared to previous visits 4. Continue his current anticoagulant  He followed back to the office has decided not to pursue EP consultation or interventions are requested attempted cardioversion to resume sinus rhythm on his amiodarone.  He is scheduled for cardioversion 12/10/2018 with Dr. Mayford Knifeurner and is compliant with his anticoagulant and his antiarrhythmic drug.   Next appointment: as needed   Medication Adjustments/Labs and Tests Ordered: Current medicines are reviewed at length with the patient today.  Concerns regarding medicines are outlined above.  No orders of the defined types were placed in this encounter.  No orders of the defined types were placed in this encounter.   Chief Complaint  Patient presents with  . Follow-up    AF after increasing amiodarone for 2 weeks    History of Present Illness:    Thomas Diaz is a 84 y.o. male with a hx of CAD, CABG 2009 hypertension, hyperlipidemia atrial fibrillation maintaining sinus rhythm on amiodarone aortic stenosis and TAVR 05/31/2017 last seen by Dr. Donnie Ahoilley 02/14/2018.  At that time he is in sinus rhythm continue amiodarone and consider doing well not having  cardiovascular symptoms with his CAD and TAVR.  He was seen by me in the office 11/03/2018 Dr. York Spanielilley's absence and was found to be in rate controlled atrial fibrillation we increased the dose of his amiodarone and decided to take a conservative approach and reassess in 2 weeks to see if he would resume sinus rhythm.  He was continued on beta-blocker for rate control and his anticoagulant.  Laboratory test showed a subtherapeutic noramiodarone level with normal thyroid and liver function.  He has stable mild CKD creatinine 1.26.  Plan was for you arrange for outpatient cardioversion of atrial fibrillation persisted.  His chronic amiodarone dose was quite low at 100 mg/day  Compliance with diet, lifestyle and medications: Yes  He is seen today he is back in atrial fibrillation he is aware of it he has lost some of his strength and endurance.  We discussed whether to remain atrial fibrillation rate control he is uncomfortable whether to do repeated cardioversion he prefers not to whether refer for EP for evaluation for PVI and this is his choice.  In the interim we will reduce his amiodarone to 200 mg/day continue his anticoagulant is scheduled to be seen by electro physiology.  He has had no chest pain syncope shortness of breath or edema. Past Medical History:  Diagnosis Date  . Aortic stenosis 07/06/2013   Echocardiogram on 06/19/13 and showed mild to moderate aortic stenosis.  His ejection fraction is 55-60%. >> s/p TAVR 05/2017  . BPH (benign prostatic hyperplasia) 05/30/2011  . CAD (coronary artery  disease), native coronary artery 03/07/2017   Cath 2009 with 60% LM and 95% RCA  CABG 6//19/09 with LIMA to LAD, SVG to RCA, SVG to int-OM and SVG to Dx Dr. Tyrone SageGerhardt  . ED (erectile dysfunction)   . 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 Gout   . Heart murmur    had all his life  . Hyperlipidemia   . Hypertension   . OA (osteoarthritis)   . PAC (premature atrial contraction)    . Persistent atrial fibrillation 06/16/2013   CHA2DS2VASC score 4 Cardioversion 03/07/17 with reversion in one week Cardioversion 04/10/17 after loading with amiodarone with sinus   . 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 CARDIOVASCULAR STRESS TEST  11/03/04   EF 66%  . CARDIOVERSION N/A 03/07/2017   Procedure: CARDIOVERSION;  Surgeon: Othella BoyerWilliam S Tilley, MD;  [facility];  Service: Cardiovascular;  Laterality: N/A;  . CARDIOVERSION N/A 04/10/2017   Procedure: CARDIOVERSION;  Surgeon: Othella Boyerilley, William S, MD;  [facility];  Service: Cardiovascular;  Laterality: N/A;  . COLONOSCOPY  2000  . CORONARY ARTERY BYPASS GRAFT  04/2008  . RIGHT/LEFT HEART CATH AND CORONARY/GRAFT ANGIOGRAPHY N/A 05/09/2017   Procedure: Right/Left Heart Cath and Coronary/Graft Angiography;  Surgeon: Lennette BihariKelly, Thomas A, MD;  [facility];  Service: Cardiovascular;  Laterality: N/A;  . TEE WITHOUT CARDIOVERSION N/A 06/18/2017   Procedure: TRANSESOPHAGEAL ECHOCARDIOGRAM (TEE);  Surgeon: Tonny Bollmanooper, Michael, MD;  [facility];  Service: Open Heart Surgery;  Laterality: N/A;  . TONSILLECTOMY AND ADENOIDECTOMY    . TRANSCATHETER AORTIC VALVE REPLACEMENT, TRANSFEMORAL N/A 06/18/2017   Procedure: TRANSCATHETER AORTIC VALVE REPLACEMENT, TRANSFEMORAL using an Edwards 26 mm Sapien 3 Aortic Valve;  Surgeon: Tonny Bollmanooper, Michael, MD;  [facility];  Service: Open Heart Surgery;  Laterality: N/A;    Current Medications: Current Meds  Medication Sig  . amiodarone (PACERONE) 200 MG tablet Take 1 tablet (200 mg total) by mouth 2 (two) times daily.  . diazepam (VALIUM) 5 MG tablet Take 5 mg by mouth at bedtime as  needed (sleep).  Marland Kitchen. ipratropium (ATROVENT) 0.06 % nasal spray Place 1 spray into the nose daily as needed.   Marland Kitchen. losartan (COZAAR) 50 MG tablet Take 1 tablet (50 mg total) by mouth daily.  . metoprolol tartrate (LOPRESSOR) 25 MG tablet Take 25 mg by mouth 2 (two) times daily.  . rosuvastatin (CRESTOR) 5 MG tablet Take 5 mg by mouth 3 (three) times a week.  Carlena Hurl. XARELTO 20 MG TABS tablet Take 20 mg by mouth daily.      Allergies:   Patient has no known allergies.   Social History   Socioeconomic History  . Marital status: Married    Spouse name: Not on file  . Number of children: Not on file  . Years of education: Not on file  . Highest education level: Not on file  Occupational History  . Not on file  Social Needs  . Financial resource strain: Not on file  . Food insecurity:    Worry: Not on file    Inability: Not on file  . Transportation needs:  Medical: Not on file    Non-medical: Not on file  Tobacco Use  . Smoking status: Never Smoker  . Smokeless tobacco: Never Used  Substance and Sexual Activity  . Alcohol use: No  . Drug use: No  . Sexual activity: Not on file  Lifestyle  . Physical activity:    Days per week: Not on file    Minutes per session: Not on file  . Stress: Not on file  Relationships  . Social connections:    Talks on phone: Not on file    Gets together: Not on file    Attends religious service: Not on file    Active member of club or organization: Not on file    Attends meetings of clubs or organizations: Not on file    Relationship status: Not on file  Other Topics Concern  . Not on file  Social History Narrative  . Not on file     Family History: The patient's family history includes Heart attack in his brother and father. ROS:   Please see the history of present illness.    All other systems reviewed and are negative.  EKGs/Labs/Other Studies Reviewed:    The following studies were reviewed today:  EKG:  EKG ordered today.  The ekg  ordered today demonstrates rate controlled AF  Recent Labs: 11/03/2018: ALT 27; BUN 17; Creatinine, Ser 1.26; Potassium 4.9; Sodium 145; TSH 1.400  Recent Lipid Panel    Component Value Date/Time   CHOL 159 11/03/2018 1612   TRIG 122 11/03/2018 1612   HDL 57 11/03/2018 1612   CHOLHDL 2.8 11/03/2018 1612   CHOLHDL 3 04/13/2015 0947   VLDL 14.6 04/13/2015 0947   LDLCALC 78 11/03/2018 1612    Physical Exam:    VS:  BP 130/84 (BP Location: Right Arm, Patient Position: Sitting, Cuff Size: Normal)   Pulse 87   Ht 5\' 10"  (1.778 m)   Wt 184 lb 1.9 oz (83.5 kg)   SpO2 96%   BMI 26.42 kg/m     Wt Readings from Last 3 Encounters:  11/17/18 184 lb 1.9 oz (83.5 kg)  11/03/18 183 lb (83 kg)  06/26/18 184 lb 6.4 oz (83.6 kg)     GEN:  Well nourished, well developed in no acute distress HEENT: Normal NECK: No JVD; No carotid bruits LYMPHATICS: No lymphadenopathy CARDIAC: IrrIrrvariable S1 , no murmurs, rubs, gallops RESPIRATORY:  Clear to auscultation without rales, wheezing or rhonchi  ABDOMEN: Soft, non-tender, non-distended MUSCULOSKELETAL:  No edema; No deformity  SKIN: Warm and dry NEUROLOGIC:  Alert and oriented x 3 PSYCHIATRIC:  Normal affect    Signed, Norman Herrlich, MD  11/17/2018 2:17 PM    [facility]

## 2018-11-17 ENCOUNTER — Ambulatory Visit (INDEPENDENT_AMBULATORY_CARE_PROVIDER_SITE_OTHER): Payer: Medicare Other | Admitting: Cardiology

## 2018-11-17 ENCOUNTER — Encounter: Payer: Self-pay | Admitting: Cardiology

## 2018-11-17 VITALS — BP 130/84 | HR 87 | Ht 70.0 in | Wt 184.1 lb

## 2018-11-17 DIAGNOSIS — I480 Paroxysmal atrial fibrillation: Secondary | ICD-10-CM

## 2018-11-17 DIAGNOSIS — Z7901 Long term (current) use of anticoagulants: Secondary | ICD-10-CM | POA: Diagnosis not present

## 2018-11-17 DIAGNOSIS — Z79899 Other long term (current) drug therapy: Secondary | ICD-10-CM

## 2018-11-17 MED ORDER — XARELTO 20 MG PO TABS: 20.0000 mg | ORAL_TABLET | Freq: Every day | ORAL | 6 refills | Status: DC

## 2018-11-17 MED ORDER — AMIODARONE HCL 200 MG PO TABS: 200.0000 mg | ORAL_TABLET | Freq: Every day | ORAL | 6 refills | Status: DC

## 2018-11-17 NOTE — Patient Instructions (Signed)
Medication Instructions:  Your physician has recommended you make the following change in your medication:   DECREASE amiodarone (pacerone) 200 mg: Take 1 tablet daily   If you need a refill on your cardiac medications before your next appointment, please call your pharmacy.   Lab work: None  If you have labs (blood work) drawn today and your tests are completely normal, you will receive your results only by: Marland Kitchen. MyChart Message (if you have MyChart) OR . A paper copy in the mail If you have any lab test that is abnormal or we need to change your treatment, we will call you to review the results.  Testing/Procedures: You had an EKG today.  You have been referred to see an electrophysiologist. You will be contacted to schedule this appointment.   Follow-Up: At [facility], you and your health needs are our priority.  As part of our continuing mission to provide you with exceptional heart care, we have created designated Provider Care Teams.  These Care Teams include your primary Cardiologist (physician) and Advanced Practice Providers (APPs -  Physician Assistants and Nurse Practitioners) who all work together to provide you with the care you need, when you need it. You will need a follow up appointment after electrophysiology evaluation.

## 2018-11-24 ENCOUNTER — Institutional Professional Consult (permissible substitution): Payer: Medicare Other | Admitting: Cardiology

## 2018-11-28 ENCOUNTER — Telehealth: Payer: Self-pay | Admitting: Cardiology

## 2018-11-28 NOTE — Telephone Encounter (Signed)
Patient would like cardioversion scheduled for the week of January 20th please if possible.

## 2018-11-28 NOTE — Telephone Encounter (Signed)
Patient did not go to scheduled appointment with Dr. Elberta Fortis on 11/24/2018 and is now requesting to be scheduled for a cardioversion. Please advise.

## 2018-11-28 NOTE — Telephone Encounter (Signed)
Let's schedule on Monday

## 2018-12-02 ENCOUNTER — Encounter: Payer: Self-pay | Admitting: *Deleted

## 2018-12-02 DIAGNOSIS — I480 Paroxysmal atrial fibrillation: Secondary | ICD-10-CM

## 2018-12-02 DIAGNOSIS — Z01812 Encounter for preprocedural laboratory examination: Secondary | ICD-10-CM

## 2018-12-02 NOTE — Addendum Note (Signed)
Addended by: Norman Herrlich on: 12/02/2018 05:27 PM   Modules accepted: Orders, SmartSet

## 2018-12-02 NOTE — Telephone Encounter (Signed)
Patient has been scheduled for a cardioversion on Wednesday, 12/10/2018 with Dr. Mayford Knife at 1:45 pm per Dr. Dulce Sellar. Reviewed instructions with patient over the phone and sent a copy in the mail. Patient informed to arrive at [facility] at 12:15 pm the day of scheduled procedure to have lab work done beforehand. Patient verbalized understanding. Advised patient to contact our office with further questions or concerns.

## 2018-12-10 ENCOUNTER — Ambulatory Visit (HOSPITAL_COMMUNITY): Payer: Medicare Other | Admitting: Certified Registered Nurse Anesthetist

## 2018-12-10 ENCOUNTER — Ambulatory Visit (HOSPITAL_COMMUNITY)
Admission: RE | Admit: 2018-12-10 | Discharge: 2018-12-10 | Disposition: A | Discharge status: Home / Self Care | Payer: Medicare Other | Attending: Cardiology | Admitting: Cardiology

## 2018-12-10 ENCOUNTER — Encounter (HOSPITAL_COMMUNITY): Payer: Self-pay | Admitting: *Deleted

## 2018-12-10 ENCOUNTER — Other Ambulatory Visit: Payer: Self-pay

## 2018-12-10 ENCOUNTER — Encounter (HOSPITAL_COMMUNITY)
Admission: RE | Disposition: A | Discharge status: Home / Self Care | Payer: Self-pay | Source: Home / Self Care | Attending: Cardiology

## 2018-12-10 DIAGNOSIS — Z951 Presence of aortocoronary bypass graft: Secondary | ICD-10-CM | POA: Diagnosis not present

## 2018-12-10 DIAGNOSIS — Z952 Presence of prosthetic heart valve: Secondary | ICD-10-CM | POA: Insufficient documentation

## 2018-12-10 DIAGNOSIS — M1990 Unspecified osteoarthritis, unspecified site: Secondary | ICD-10-CM | POA: Insufficient documentation

## 2018-12-10 DIAGNOSIS — M109 Gout, unspecified: Secondary | ICD-10-CM | POA: Diagnosis not present

## 2018-12-10 DIAGNOSIS — I2510 Atherosclerotic heart disease of native coronary artery without angina pectoris: Secondary | ICD-10-CM | POA: Diagnosis not present

## 2018-12-10 DIAGNOSIS — I480 Paroxysmal atrial fibrillation: Secondary | ICD-10-CM | POA: Insufficient documentation

## 2018-12-10 DIAGNOSIS — Z7901 Long term (current) use of anticoagulants: Secondary | ICD-10-CM | POA: Insufficient documentation

## 2018-12-10 DIAGNOSIS — I050 Rheumatic mitral stenosis: Secondary | ICD-10-CM | POA: Diagnosis not present

## 2018-12-10 DIAGNOSIS — Z8249 Family history of ischemic heart disease and other diseases of the circulatory system: Secondary | ICD-10-CM | POA: Insufficient documentation

## 2018-12-10 DIAGNOSIS — E785 Hyperlipidemia, unspecified: Secondary | ICD-10-CM | POA: Insufficient documentation

## 2018-12-10 DIAGNOSIS — Z79899 Other long term (current) drug therapy: Secondary | ICD-10-CM | POA: Insufficient documentation

## 2018-12-10 DIAGNOSIS — I491 Atrial premature depolarization: Secondary | ICD-10-CM | POA: Diagnosis not present

## 2018-12-10 DIAGNOSIS — Z955 Presence of coronary angioplasty implant and graft: Secondary | ICD-10-CM | POA: Insufficient documentation

## 2018-12-10 DIAGNOSIS — I4819 Other persistent atrial fibrillation: Secondary | ICD-10-CM

## 2018-12-10 DIAGNOSIS — I119 Hypertensive heart disease without heart failure: Secondary | ICD-10-CM | POA: Insufficient documentation

## 2018-12-10 DIAGNOSIS — N400 Enlarged prostate without lower urinary tract symptoms: Secondary | ICD-10-CM | POA: Insufficient documentation

## 2018-12-10 HISTORY — PX: CARDIOVERSION: SHX1299

## 2018-12-10 LAB — POCT I-STAT 4, (NA,K, GLUC, HGB,HCT)
Glucose, Bld: 103 mg/dL — ABNORMAL HIGH (ref 70–99)
HCT: 49 % (ref 39.0–52.0)
Hemoglobin: 16.7 g/dL (ref 13.0–17.0)
POTASSIUM: 4.4 mmol/L (ref 3.5–5.1)
SODIUM: 141 mmol/L (ref 135–145)

## 2018-12-10 SURGERY — CARDIOVERSION
Anesthesia: General

## 2018-12-10 MED ORDER — LIDOCAINE 2% (20 MG/ML) 5 ML SYRINGE
INTRAMUSCULAR | Status: DC | PRN
  Administered 2018-12-10: 60 mg via INTRAVENOUS

## 2018-12-10 MED ORDER — PROPOFOL 10 MG/ML IV BOLUS
INTRAVENOUS | Status: DC | PRN
  Administered 2018-12-10: 50 mg via INTRAVENOUS

## 2018-12-10 MED ORDER — XARELTO 20 MG PO TABS: 20.0000 mg | ORAL_TABLET | Freq: Every evening | ORAL | Status: DC

## 2018-12-10 NOTE — Transfer of Care (Signed)
Immediate Anesthesia Transfer of Care Note  Patient: Thomas Diaz  Procedure(s) Performed: CARDIOVERSION (N/A )  Patient Location: Endoscopy Unit  Anesthesia Type:General  Level of Consciousness: drowsy  Airway & Oxygen Therapy: Patient Spontanous Breathing  Post-op Assessment: Report given to RN and Post -op Vital signs reviewed and stable  Post vital signs: Reviewed and stable  Last Vitals:  Vitals Value Taken Time  BP    Temp    Pulse    Resp    SpO2      Last Pain:  Vitals:   12/10/18 1213  TempSrc: Oral  PainSc: 0-No pain         Complications: No apparent anesthesia complications

## 2018-12-10 NOTE — Discharge Instructions (Signed)
Electrical Cardioversion, Care After °This sheet gives you information about how to care for yourself after your procedure. Your health care provider may also give you more specific instructions. If you have problems or questions, contact your health care provider. °What can I expect after the procedure? °After the procedure, it is common to have: °· Some redness on the skin where the shocks were given. °Follow these instructions at home: ° °· Do not drive for 24 hours if you were given a medicine to help you relax (sedative). °· Take over-the-counter and prescription medicines only as told by your health care provider. °· Ask your health care provider how to check your pulse. Check it often. °· Rest for 48 hours after the procedure or as told by your health care provider. °· Avoid or limit your caffeine use as told by your health care provider. °Contact a health care provider if: °· You feel like your heart is beating too quickly or your pulse is not regular. °· You have a serious muscle cramp that does not go away. °Get help right away if: ° °· You have discomfort in your chest. °· You are dizzy or you feel faint. °· You have trouble breathing or you are short of breath. °· Your speech is slurred. °· You have trouble moving an arm or leg on one side of your body. °· Your fingers or toes turn cold or blue. °This information is not intended to replace advice given to you by your health care provider. Make sure you discuss any questions you have with your health care provider. °Document Released: 08/26/2013 Document Revised: 06/08/2016 Document Reviewed: 05/11/2016 °Elsevier Interactive Patient Education © 2019 [employer] ° °

## 2018-12-10 NOTE — Anesthesia Preprocedure Evaluation (Signed)
Anesthesia Evaluation  Patient identified by MRN, date of birth, ID band Patient awake    Reviewed: Allergy & Precautions, NPO status , Patient's Chart, lab work & pertinent test results, reviewed documented beta blocker date and time   Airway Mallampati: II  TM Distance: >3 FB Neck ROM: Full    Dental  (+) Teeth Intact, Dental Advisory Given   Pulmonary neg pulmonary ROS,    Pulmonary exam normal breath sounds clear to auscultation       Cardiovascular hypertension, Pt. on home beta blockers and Pt. on medications + CAD and + CABG  + dysrhythmias Atrial Fibrillation + Valvular Problems/Murmurs (s/p TAVR) AS  Rhythm:Irregular Rate:Abnormal + Systolic murmurs    Neuro/Psych negative neurological ROS  negative psych ROS   GI/Hepatic negative GI ROS, Neg liver ROS,   Endo/Other  negative endocrine ROS  Renal/GU negative Renal ROS     Musculoskeletal  (+) Arthritis ,   Abdominal   Peds  Hematology  (+) Blood dyscrasia (Xarelto), ,   Anesthesia Other Findings Day of surgery medications reviewed with the patient.  Reproductive/Obstetrics                             Anesthesia Physical Anesthesia Plan  ASA: III  Anesthesia Plan: General   Post-op Pain Management:    Induction: Intravenous  PONV Risk Score and Plan: 2 and Treatment may vary due to age or medical condition  Airway Management Planned: Mask  Additional Equipment:   Intra-op Plan:   Post-operative Plan:   Informed Consent: I have reviewed the patients History and Physical, chart, labs and discussed the procedure including the risks, benefits and alternatives for the proposed anesthesia with the patient or authorized representative who has indicated his/her understanding and acceptance.     Dental advisory given  Plan Discussed with: CRNA  Anesthesia Plan Comments:         Anesthesia Quick Evaluation

## 2018-12-10 NOTE — Anesthesia Postprocedure Evaluation (Signed)
Anesthesia Post Note  Patient: Thomas Diaz  Procedure(s) Performed: CARDIOVERSION (N/A )     Patient location during evaluation: Endoscopy Anesthesia Type: General Level of consciousness: awake and alert Pain management: pain level controlled Vital Signs Assessment: post-procedure vital signs reviewed and stable Respiratory status: spontaneous breathing, nonlabored ventilation, respiratory function stable and patient connected to nasal cannula oxygen Cardiovascular status: blood pressure returned to baseline and stable Postop Assessment: no apparent nausea or vomiting Anesthetic complications: no    Last Vitals:  Vitals:   12/10/18 1355 12/10/18 1400  BP: 111/66 127/67  Pulse: (!) 46 (!) 48  Resp: 20 15  Temp:    SpO2: 97% 96%    Last Pain:  Vitals:   12/10/18 1350  TempSrc:   PainSc: 0-No pain                 Cecile Hearing

## 2018-12-10 NOTE — CV Procedure (Signed)
   Electrical Cardioversion Procedure Note Thomas Diaz [id number] April 24, 1932  Procedure: Electrical Cardioversion Indications:  Atrial Fibrillation  Time Out: Verified patient identification, verified procedure,medications/allergies/relevent history reviewed, required imaging and test results available.  Performed  Procedure Details  The patient was NPO after midnight. Anesthesia was administered at the beside  by Dr.Turk with 50mg  of propofol and 60mg  Lidocaine.  Cardioversion was done with synchronized biphasic defibrillation with AP pads with 150watts.  The patient converted to normal sinus rhythm. The patient tolerated the procedure well   IMPRESSION:  Successful cardioversion of atrial fibrillation    Thomas Diaz 12/10/2018, 1:25 PM

## 2018-12-10 NOTE — Anesthesia Procedure Notes (Signed)
Procedure Name: General with mask airway Performed by: Draven Laine B, CRNA Pre-anesthesia Checklist: Patient identified, Emergency Drugs available, Suction available, Patient being monitored and Timeout performed Patient Re-evaluated:Patient Re-evaluated prior to induction Oxygen Delivery Method: Ambu bag Preoxygenation: Pre-oxygenation with 100% oxygen Induction Type: IV induction Ventilation: Mask ventilation without difficulty Placement Confirmation: positive ETCO2 Dental Injury: Teeth and Oropharynx as per pre-operative assessment        

## 2018-12-10 NOTE — Interval H&P Note (Signed)
History and Physical Interval Note:  12/10/2018 1:25 PM  Thomas Diaz  has presented today for surgery, with the diagnosis of AFIB  The various methods of treatment have been discussed with the patient and family. After consideration of risks, benefits and other options for treatment, the patient has consented to  Procedure(s): CARDIOVERSION (N/A) as a surgical intervention .  The patient's history has been reviewed, patient examined, no change in status, stable for surgery.  I have reviewed the patient's chart and labs.  Questions were answered to the patient's satisfaction.     Armanda Magic

## 2018-12-22 ENCOUNTER — Other Ambulatory Visit (HOSPITAL_COMMUNITY): Payer: Medicare Other

## 2018-12-24 ENCOUNTER — Telehealth: Payer: Self-pay | Admitting: Cardiology

## 2018-12-24 NOTE — Telephone Encounter (Signed)
Patient informed. He verbally understands

## 2018-12-24 NOTE — Telephone Encounter (Signed)
Called patient back, he reports he doesn't need a refill. He needs clarification from Dr. Dulce Sellar if he should still take the full dose of 200 mg amiodarone daily since he is back in rhythm. I will forward to him and follow back up with patient.

## 2018-12-24 NOTE — Telephone Encounter (Signed)
Yes 200 mg amiodarone daily

## 2018-12-31 ENCOUNTER — Encounter: Payer: Self-pay | Admitting: *Deleted

## 2019-01-01 ENCOUNTER — Encounter (HOSPITAL_COMMUNITY): Payer: Self-pay | Admitting: Cardiology

## 2019-01-01 ENCOUNTER — Other Ambulatory Visit (HOSPITAL_COMMUNITY): Payer: Self-pay | Admitting: Physician Assistant

## 2019-01-01 ENCOUNTER — Ambulatory Visit (HOSPITAL_COMMUNITY): Payer: Medicare Other | Attending: Cardiology

## 2019-01-01 DIAGNOSIS — Z952 Presence of prosthetic heart valve: Secondary | ICD-10-CM

## 2019-01-01 NOTE — Progress Notes (Unsigned)
Patient ID: Thomas Diaz, male   DOB: [birth year], 85 y.o.   MRN: [id number]    Definity was not administered. Mr. Laurena BeringManges said that he wanted his echo done as quickly as possible because he was going to meet with his broker and he was not willing to stay.

## 2019-01-12 ENCOUNTER — Ambulatory Visit (INDEPENDENT_AMBULATORY_CARE_PROVIDER_SITE_OTHER): Payer: Medicare Other | Admitting: Cardiology

## 2019-01-12 ENCOUNTER — Encounter: Payer: Self-pay | Admitting: Cardiology

## 2019-01-12 VITALS — BP 140/80 | HR 50 | Ht 70.0 in | Wt 184.0 lb

## 2019-01-12 DIAGNOSIS — I4819 Other persistent atrial fibrillation: Secondary | ICD-10-CM | POA: Diagnosis not present

## 2019-01-12 NOTE — Progress Notes (Signed)
Electrophysiology Office Note   Date:  01/12/2019   ID:  Thomas Diaz, DOB June 05, 1932, MRN [id number]  PCP:  Thomas Boyer, MD  Cardiologist:  Thomas Diaz, telemetry Primary Electrophysiologist:  Thomas Noto Thomas Loa, MD    No chief complaint on file.    History of Present Illness: Thomas Diaz is a 86 y.o. male who is being seen today for the evaluation of atrial fibrillation at the request of Thomas Diaz, Thomas Oven, MD. Presenting today for electrophysiology evaluation.  He has a history of aortic stenosis status post TAVR, coronary artery disease status post CABG x5, hypertension, hyperlipidemia.  He is currently on amiodarone.  He was initially on a low-dose of amiodarone but went into atrial fibrillation.  His amiodarone dose was increased.  He had a cardioversion 12/10/2018, but unfortunately went back into atrial fibrillation.  He is in sinus rhythm today.  He is feeling well.  He goes on walks to see his wife in memory care.  He walks, according to him, a few miles a day.  He was diagnosed with atrial fibrillation after his CABG.  He was put on amiodarone a few years ago which was decreased to 100 mg by Dr. Donnie Diaz, but it has been increased since going back into atrial fibrillation.  Today, he denies symptoms of palpitations, chest pain, shortness of breath, orthopnea, PND, lower extremity edema, claudication, dizziness, presyncope, syncope, bleeding, or neurologic sequela. The patient is tolerating medications without difficulties.    Past Medical History:  Diagnosis Date  . Aortic stenosis 07/06/2013   Echocardiogram on 06/19/13 and showed mild to moderate aortic stenosis.  His ejection fraction is 55-60%. >> s/p TAVR 05/2017  . BPH (benign prostatic hyperplasia) 05/30/2011  . CAD (coronary artery disease), native coronary artery 03/07/2017   Cath 2009 with 60% LM and 95% RCA  CABG 6//19/09 with LIMA to LAD, SVG to RCA, SVG to int-OM and SVG to Dx Dr. Tyrone Diaz  . ED (erectile  dysfunction)   . 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 Gout   . Heart murmur    had all his life  . Hyperlipidemia   . Hypertension   . OA (osteoarthritis)   . PAC (premature atrial contraction)   . Persistent atrial fibrillation 06/16/2013   CHA2DS2VASC score 4 Cardioversion 03/07/17 with reversion in one week Cardioversion 04/10/17 after loading with amiodarone with sinus   . S/P CABG x 5 05/12/2008   CABG x 5 - LIMA to LAD, SVG to Diag, Sequential SVG to ramus-OM1, SVG to RCA - Dr Thomas Diaz  . 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 CARDIOVASCULAR STRESS TEST  11/03/04   EF 66%  . CARDIOVERSION N/A 03/07/2017   Procedure: CARDIOVERSION;  Surgeon: Thomas Boyer, MD;  Location[facility] ENDOSCOPY;  Service: Cardiovascular;  Laterality: N/A;  . CARDIOVERSION N/A 04/10/2017   Procedure: CARDIOVERSION;  Surgeon: Thomas Boyer, MD;  Location: [facility] ENDOSCOPY;  Service: Cardiovascular;  Laterality: N/A;  . CARDIOVERSION N/A 12/10/2018   Procedure: CARDIOVERSION;  Surgeon: Thomas Reichert, MD;  Location: [facility] ENDOSCOPY;  Service: Cardiovascular;  Laterality: N/A;  . COLONOSCOPY  2000  . CORONARY ARTERY BYPASS GRAFT  04/2008  . RIGHT/LEFT HEART CATH AND CORONARY/GRAFT ANGIOGRAPHY N/A 05/09/2017   Procedure: Right/Left Heart Cath and Coronary/Graft Angiography;  Surgeon:  Thomas Bihari, MD;  Location: [facility] INVASIVE CV LAB;  Service: Cardiovascular;  Laterality: N/A;  . TEE WITHOUT CARDIOVERSION N/A 06/18/2017   Procedure: TRANSESOPHAGEAL ECHOCARDIOGRAM (TEE);  Surgeon: Thomas Bollman, MD;  Location: [facility] OR;  Service: Open Heart Surgery;  Laterality: N/A;  . TONSILLECTOMY AND ADENOIDECTOMY    . TRANSCATHETER AORTIC VALVE REPLACEMENT, TRANSFEMORAL N/A 06/18/2017   Procedure: TRANSCATHETER AORTIC VALVE  REPLACEMENT, TRANSFEMORAL using an Edwards 26 mm Sapien 3 Aortic Valve;  Surgeon: Thomas Bollman, MD;  Location: [facility] OR;  Service: Open Heart Surgery;  Laterality: N/A;     Current Outpatient Medications  Medication Sig Dispense Refill  . amiodarone (PACERONE) 200 MG tablet Take 1 tablet (200 mg total) by mouth daily. 30 tablet 6  . diazepam (VALIUM) 5 MG tablet Take 5 mg by mouth at bedtime as needed (sleep).    . losartan (COZAAR) 50 MG tablet Take 1 tablet (50 mg total) by mouth daily. 90 tablet 3  . metoprolol tartrate (LOPRESSOR) 25 MG tablet Take 25 mg by mouth 2 (two) times daily.    . rosuvastatin (CRESTOR) 5 MG tablet Take 5 mg by mouth every Monday, Wednesday, and Friday.     Thomas Diaz 20 MG TABS tablet Take 1 tablet (20 mg total) by mouth every evening.     No current facility-administered medications for this visit.     Allergies:   Patient has no known allergies.   Social History:  The patient  reports that he has never smoked. He has never used smokeless tobacco. He reports that he does not drink alcohol or use drugs.   Family History:  The patient's family history includes Heart attack in his brother and father.    ROS:  Please see the history of present illness.   Otherwise, review of systems is positive for none.   All other systems are reviewed and negative.    PHYSICAL EXAM: VS:  BP 140/80   Pulse (!) 50   Ht 5\' 10"  (1.778 m)   Wt 184 lb (83.5 kg)   BMI 26.40 kg/m  , BMI Body mass index is 26.4 kg/m. GEN: Well nourished, well developed, in no acute distress  HEENT: normal  Neck: no JVD, carotid bruits, or masses Cardiac: RRR; no murmurs, rubs, or gallops,no edema  Respiratory:  clear to auscultation bilaterally, normal work of breathing GI: soft, nontender, nondistended, + BS MS: no deformity or atrophy  Skin: warm and dry Neuro:  Strength and sensation are intact Psych: euthymic mood, full affect  EKG:  EKG is ordered today. Personal review of the ekg  ordered shows this rhythm, rate 50, first-degree AV block, QTC 461 ms  Recent Labs: 11/03/2018: ALT 27; BUN 17; Creatinine, Ser 1.26; TSH 1.400 12/10/2018: Hemoglobin 16.7; Potassium 4.4; Sodium 141    Lipid Panel     Component Value Date/Time   CHOL 159 11/03/2018 1612   TRIG 122 11/03/2018 1612   HDL 57 11/03/2018 1612   CHOLHDL 2.8 11/03/2018 1612   CHOLHDL 3 04/13/2015 0947   VLDL 14.6 04/13/2015 0947   LDLCALC 78 11/03/2018 1612     Wt Readings from Last 3 Encounters:  01/12/19 184 lb (83.5 kg)  12/10/18 180 lb (81.6 kg)  11/17/18 184 lb 1.9 oz (83.5 kg)      Other studies Reviewed: Additional studies/ records that were reviewed today include: TTE 01/01/19  Review of the above records today demonstrates:   1. The left ventricle has low normal systolic function, with  an ejection fraction of 50-55%. The cavity size was normal. There is mildly increased left ventricular wall thickness. Left ventricular diastolic Doppler parameters are consistent with  pseudonormalization Elevated mean left atrial pressure.  2. The right ventricle has mildly reduced systolic function. The cavity was mildly enlarged. There is no increase in right ventricular wall thickness.  3. Left atrial size was moderately dilated.  4. Right atrial size was mildly dilated.  5. The mitral valve is normal in structure. There is mild mitral annular calcification present. No evidence of mitral valve stenosis. Mild regurgitation.  6. The tricuspid valve is normal in structure.  7. Bioprosthetic aortic valve s/p TAVR. Trivial perivalvular regurgitation. Mean gradient across the valve of 12 mmHg.  8. The aortic root and ascending aorta are normal in size and structure.  9. Peak RV-RA gradient 30 mmHg. 10. Aortic valve regurgitation is trivial by color flow Doppler.   ASSESSMENT AND PLAN:  1.  Persistent atrial fibrillation: Currently on amiodarone and Xarelto.  Amiodarone dose was recently increased to 200 mg a  day.  He remains in sinus rhythm.  We Somalia Segler make no changes at this time.  We may decrease his amiodarone at his next visit.  This patients CHA2DS2-VASc Score and unadjusted Ischemic Stroke Rate (% per year) is equal to 4.8 % stroke rate/year from a score of 4  Above score calculated as 1 point each if present [CHF, HTN, DM, Vascular=MI/PAD/Aortic Plaque, Age if 65-74, or Male] Above score calculated as 2 points each if present [Age > 75, or Stroke/TIA/TE]  2.  Coronary artery disease status post CABG: No chest pain  3.  Hypertension: Mildly elevated today.  Kripa Foskey reassess at his next visit for medication changes.  4.  Hyperlipidemia: Continue statin  Current medicines are reviewed at length with the patient today.   The patient does not have concerns regarding his medicines.  The following changes were made today:  none  Labs/ tests ordered today include:  Orders Placed This Encounter  Procedures  . EKG 12-Lead     Disposition:   FU with Mikena Masoner 3 months  Signed, Alaa Mullally Thomas Loa, MD  01/12/2019 9:39 AM     [facility] [address] [place] [address] [phone] (office) [phone] (fax)

## 2019-01-12 NOTE — Patient Instructions (Signed)
Medication Instructions:    Your physician recommends that you continue on your current medications as directed. Please refer to the Current Medication list given to you today.  - If you need a refill on your cardiac medications before your next appointment, please call your pharmacy.   Labwork:  None ordered  Testing/Procedures:  None ordered  Follow-Up:  Your physician recommends that you schedule a follow-up appointment in: 3 months with Dr. Camnitz.  Thank you for choosing [facility]!!   Saadiq Poche, RN [phone]         

## 2019-03-04 ENCOUNTER — Telehealth: Payer: Self-pay | Admitting: Cardiology

## 2019-03-04 NOTE — Telephone Encounter (Signed)
Left message to call back regarding medications. 

## 2019-03-04 NOTE — Telephone Encounter (Signed)
Patient has questions regarding multiple medications and their dosages. Please call

## 2019-03-16 NOTE — Telephone Encounter (Signed)
Pt never returned call

## 2019-04-14 ENCOUNTER — Telehealth: Payer: Self-pay | Admitting: *Deleted

## 2019-04-14 NOTE — Telephone Encounter (Signed)
Calling patient today to discuss upcoming appointment.  We are currently trying to limit exposure to the virus that causes COVID-19 by seeing patients at home rather than in the office. We would like to schedule this appointment as a Retail banker. Unable to reach patient.  Phones then hangs up.

## 2019-04-16 NOTE — Telephone Encounter (Signed)
Phone rings then hangs up.

## 2019-04-17 NOTE — Telephone Encounter (Signed)
Virtual Visit Pre-Appointment Phone Call  "(Name), I am calling you today to discuss your upcoming appointment. We are currently trying to limit exposure to the virus that causes COVID-19 by seeing patients at home rather than in the office."  1. "What is the BEST phone number to call the day of the visit?" - include this in appointment notes  2. "Do you have or have access to (through a family member/friend) a smartphone with video capability that we can use for your visit?" a. If yes - list this number in appt notes as "cell" (if different from BEST phone #) and list the appointment type as a VIDEO visit in appointment notes b. If no - list the appointment type as a PHONE visit in appointment notes  3. Confirm consent - "In the setting of the current Covid19 crisis, you are scheduled for a (phone or video) visit with your provider on (date) at (time).  Just as we do with many in-office visits, in order for you to participate in this visit, we must obtain consent.  If you'd like, I can send this to your mychart (if signed up) or email for you to review.  Otherwise, I can obtain your verbal consent now.  All virtual visits are billed to your insurance company just like a normal visit would be.  By agreeing to a virtual visit, we'd like you to understand that the technology does not allow for your provider to perform an examination, and thus may limit your provider's ability to fully assess your condition. If your provider identifies any concerns that need to be evaluated in person, we will make arrangements to do so.  Finally, though the technology is pretty good, we cannot assure that it will always work on either your or our end, and in the setting of a video visit, we may have to convert it to a phone-only visit.  In either situation, we cannot ensure that we have a secure connection.  Are you willing to proceed?" STAFF: Did the patient verbally acknowledge consent to telehealth visit? Document  YES/NO here: yes  4. Advise patient to be prepared - "Two hours prior to your appointment, go ahead and check your blood pressure, pulse, oxygen saturation, and your weight (if you have the equipment to check those) and write them all down. When your visit starts, your provider will ask you for this information. If you have an Apple Watch or Kardia device, please plan to have heart rate information ready on the day of your appointment. Please have a pen and paper handy nearby the day of the visit as well."  5. Give patient instructions for MyChart download to smartphone OR Doximity/Doxy.me as below if video visit (depending on what platform provider is using)  6. Inform patient they will receive a phone call 15 minutes prior to their appointment time (may be from unknown caller ID) so they should be prepared to answer    TELEPHONE CALL NOTE  Thomas Diaz has been deemed a candidate for a follow-up tele-health visit to limit community exposure during the Covid-19 pandemic. I spoke with the patient via phone to ensure availability of phone/video source, confirm preferred email & phone number, and discuss instructions and expectations.  I reminded Thomas Diaz to be prepared with any vital sign and/or heart rhythm information that could potentially be obtained via home monitoring, at the time of his visit. I reminded Thomas Diaz to expect a phone call prior to  his visit.  Carita Sollars 04/17/2019 4:46 PM   INSTRUCTIONS FOR DOWNLOADING THE MYCHART APP TO SMARTPHONE  - The patient must first make sure to have activated MyChart and know their login information - If Apple, go to [employer] Store and type in MyChart in the search bar and download the app. If Android, ask patient to go to [employer] Play Store and type in [facility] in the search bar and download the app. The app is free but as with any other app downloads, their phone may require them to verify saved payment information or Apple/Android  password.  - The patient will need to then log into the app with their MyChart username and password, and select [facility] as their healthcare provider to link the account. When it is time for your visit, go to the MyChart app, find appointments, and click Begin Video Visit. Be sure to Select Allow for your device to access the Microphone and Camera for your visit. You will then be connected, and your provider will be with you shortly.  **If they have any issues connecting, or need assistance please contact MyChart service desk [phone])**  **If using a computer, in order to ensure the best quality for their visit they will need to use either of the following Internet Browsers: [employer] Edge, or Google Chrome**  IF USING DOXIMITY or DOXY.ME - The patient will receive a link just prior to their visit by text.     FULL LENGTH CONSENT FOR TELE-HEALTH VISIT   I hereby voluntarily request, consent and authorize [facility] and its employed or contracted physicians, physician assistants, nurse practitioners or other licensed health care professionals (the Practitioner), to provide me with telemedicine health care services (the "Services") as deemed necessary by the treating Practitioner. I acknowledge and consent to receive the Services by the Practitioner via telemedicine. I understand that the telemedicine visit will involve communicating with the Practitioner through live audiovisual communication technology and the disclosure of certain medical information by electronic transmission. I acknowledge that I have been given the opportunity to request an in-person assessment or other available alternative prior to the telemedicine visit and am voluntarily participating in the telemedicine visit.  I understand that I have the right to withhold or withdraw my consent to the use of telemedicine in the course of my care at any time, without affecting my right to future care or treatment,  and that the Practitioner or I may terminate the telemedicine visit at any time. I understand that I have the right to inspect all information obtained and/or recorded in the course of the telemedicine visit and may receive copies of available information for a reasonable fee.  I understand that some of the potential risks of receiving the Services via telemedicine include:  Marland Kitchen. Delay or interruption in medical evaluation due to technological equipment failure or disruption; . Information transmitted may not be sufficient (e.g. poor resolution of images) to allow for appropriate medical decision making by the Practitioner; and/or  . In rare instances, security protocols could fail, causing a breach of personal health information.  Furthermore, I acknowledge that it is my responsibility to provide information about my medical history, conditions and care that is complete and accurate to the best of my ability. I acknowledge that Practitioner's advice, recommendations, and/or decision may be based on factors not within their control, such as incomplete or inaccurate data provided by me or distortions of diagnostic images or specimens that may result from electronic transmissions. I understand  that the practice of medicine is not an exact science and that Practitioner makes no warranties or guarantees regarding treatment outcomes. I acknowledge that I will receive a copy of this consent concurrently upon execution via email to the email address I last provided but may also request a printed copy by calling the office of [facility].    I understand that my insurance will be billed for this visit.   I have read or had this consent read to me. . I understand the contents of this consent, which adequately explains the benefits and risks of the Services being provided via telemedicine.  . I have been provided ample opportunity to ask questions regarding this consent and the Services and have had my questions  answered to my satisfaction. . I give my informed consent for the services to be provided through the use of telemedicine in my medical care  By participating in this telemedicine visit I agree to the above.

## 2019-04-20 ENCOUNTER — Other Ambulatory Visit: Payer: Self-pay

## 2019-04-20 ENCOUNTER — Telehealth (INDEPENDENT_AMBULATORY_CARE_PROVIDER_SITE_OTHER): Payer: Medicare Other | Admitting: Cardiology

## 2019-04-20 DIAGNOSIS — I4819 Other persistent atrial fibrillation: Secondary | ICD-10-CM

## 2019-04-20 MED ORDER — METOPROLOL SUCCINATE ER 50 MG PO TB24: 50.0000 mg | ORAL_TABLET | Freq: Every day | ORAL | 2 refills | Status: DC

## 2019-04-20 NOTE — Addendum Note (Signed)
Addended by: Baird Lyons on: 04/20/2019 12:54 PM   Modules accepted: Orders

## 2019-04-20 NOTE — Progress Notes (Signed)
Electrophysiology TeleHealth Note   Due to national recommendations of social distancing due to COVID 19, an audio/video telehealth visit is felt to be most appropriate for this patient at this time.  See Epic message for the patient's consent to telehealth for [facility].   Date:  04/20/2019   ID:  Thomas Diaz, DOB 1932/03/07, MRN [id number]  Location: patient's home  Provider location: [address] [place]  Evaluation Performed: Follow-up visit  PCP:  [name], MD  Cardiologist:  Georga Hacking, MD  Electrophysiologist:  Dr Elberta Fortis  Chief Complaint:  AF  History of Present Illness:    Thomas Diaz is a 85 y.o. male who presents via audio/video conferencing for a telehealth visit today.  Since last being seen in our clinic, the patient reports doing very well.  Today, he denies symptoms of palpitations, chest pain, shortness of breath,  lower extremity edema, dizziness, presyncope, or syncope.  The patient is otherwise without complaint today.  The patient denies symptoms of fevers, chills, cough, or new SOB worrisome for COVID 19.  He has a history significant for aortic stenosis status post TAVR, coronary artery disease status post CABG x5, hypertension, hyperlipidemia.  He also has atrial fibrillation and is on amiodarone.  Is status post cardioversion 12/10/2018.  Today, denies symptoms of palpitations, chest pain, shortness of breath, orthopnea, PND, lower extremity edema, claudication, dizziness, presyncope, syncope, bleeding, or neurologic sequela. The patient is tolerating medications without difficulties. He has noted no further AF.  He is able to do all his daily activities without restriction.  Past Medical History:  Diagnosis Date   Aortic stenosis 07/06/2013   Echocardiogram on 06/19/13 and showed mild to moderate aortic stenosis.  His ejection fraction is 55-60%. >> s/p TAVR 05/2017   BPH (benign prostatic hyperplasia) 05/30/2011    CAD (coronary artery disease), native coronary artery 03/07/2017   Cath 2009 with 60% LM and 95% RCA  CABG 6//19/09 with LIMA to LAD, SVG to RCA, SVG to int-OM and SVG to Dx Dr. 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   patient has never been told this   Gout    Heart murmur    had all his life   Hyperlipidemia    Hypertension    OA (osteoarthritis)    PAC (premature atrial contraction)    Persistent atrial fibrillation 06/16/2013   CHA2DS2VASC score 4 Cardioversion 03/07/17 with reversion in one week Cardioversion 04/10/17 after loading with amiodarone with sinus    S/P CABG x 5 05/12/2008   CABG x 5 - LIMA to LAD, SVG to Diag, Sequential SVG to ramus-OM1, SVG to RCA - Dr Tyrone Sage   S/P TAVR (transcatheter aortic valve replacement) 06/18/2017   26 mm Edwards Sapien 3 transcatheter heart valve placed via percutaneous right transfemoral approach     Past Surgical History:  Procedure Laterality Date   CARDIOVASCULAR STRESS TEST  11/03/04   EF 66%   CARDIOVERSION N/A 03/07/2017   Procedure: CARDIOVERSION;  Surgeon: [name], MD;  Location: [facility] ENDOSCOPY;  Service: Cardiovascular;  Laterality: N/A;   CARDIOVERSION N/A 04/10/2017   Procedure: CARDIOVERSION;  Surgeon: [name], MD;  Location: [facility] ENDOSCOPY;  Service: Cardiovascular;  Laterality: N/A;   CARDIOVERSION N/A 12/10/2018   Procedure: CARDIOVERSION;  Surgeon: Quintella Reichert, MD;  Location: [facility] ENDOSCOPY;  Service: Cardiovascular;  Laterality: N/A;   COLONOSCOPY  2000   CORONARY ARTERY BYPASS GRAFT  04/2008   RIGHT/LEFT HEART CATH AND CORONARY/GRAFT ANGIOGRAPHY N/A 05/09/2017   Procedure: Right/Left Heart Cath and Coronary/Graft Angiography;  Surgeon: Lennette Bihari, MD;  Location: [facility] INVASIVE CV LAB;  Service: Cardiovascular;  Laterality: N/A;   TEE WITHOUT CARDIOVERSION N/A 06/18/2017   Procedure: TRANSESOPHAGEAL ECHOCARDIOGRAM (TEE);  Surgeon: Tonny Bollman, MD;  Location:  [facility] OR;  Service: Open Heart Surgery;  Laterality: N/A;   TONSILLECTOMY AND ADENOIDECTOMY     TRANSCATHETER AORTIC VALVE REPLACEMENT, TRANSFEMORAL N/A 06/18/2017   Procedure: TRANSCATHETER AORTIC VALVE REPLACEMENT, TRANSFEMORAL using an Edwards 26 mm Sapien 3 Aortic Valve;  Surgeon: Tonny Bollman, MD;  Location: [facility] OR;  Service: Open Heart Surgery;  Laterality: N/A;    Current Outpatient Medications  Medication Sig Dispense Refill   amiodarone (PACERONE) 200 MG tablet Take 1 tablet (200 mg total) by mouth daily. 30 tablet 6   diazepam (VALIUM) 5 MG tablet Take 5 mg by mouth at bedtime as needed (sleep).     losartan (COZAAR) 50 MG tablet Take 1 tablet (50 mg total) by mouth daily. 90 tablet 3   metoprolol tartrate (LOPRESSOR) 25 MG tablet Take 25 mg by mouth 2 (two) times daily.     rosuvastatin (CRESTOR) 5 MG tablet Take 5 mg by mouth every Monday, Wednesday, and Friday.      XARELTO 20 MG TABS tablet Take 1 tablet (20 mg total) by mouth every evening.     No current facility-administered medications for this visit.     Allergies:   Patient has no known allergies.   Social History:  The patient  reports that he has never smoked. He has never used smokeless tobacco. He reports that he does not drink alcohol or use drugs.   Family History:  The patient's  family history includes Heart attack in his brother and father.   ROS:  Please see the history of present illness.   All other systems are personally reviewed and negative.    Exam:    Vital Signs:  Pulse (!) 50   Over the phone, no acute distress, no shortness of breath.  Labs/Other Tests and Data Reviewed:    Recent Labs: 11/03/2018: ALT 27; BUN 17; Creatinine, Ser 1.26; TSH 1.400 12/10/2018: Hemoglobin 16.7; Potassium 4.4; Sodium 141   Wt Readings from Last 3 Encounters:  01/12/19 184 lb (83.5 kg)  12/10/18 180 lb (81.6 kg)  11/17/18 184 lb 1.9 oz (83.5 kg)     Other studies personally reviewed: Additional  studies/ records that were reviewed today include: ECG 01/12/2019 personally reviewed Review of the above records today demonstrates: Sinus rhythm rate 50  ASSESSMENT & PLAN:    1.  Persistent atrial fibrillation: Currently on amiodarone and Xarelto.  He remains in sinus rhythm.  He is on metoprolol, and he would like to switch this to Toprol-XL.  We Stina Gane call him and 50 mg of Toprol-XL and stop his metoprolol.  He is getting blood drawn next week at his primary physician's office.  I have told him to get a TSH and LFTs.  This patients CHA2DS2-VASc Score and unadjusted Ischemic Stroke Rate (% per year) is equal to 4.8 % stroke rate/year from a score of 4  Above score calculated as 1 point each if present [CHF, HTN, DM, Vascular=MI/PAD/Aortic Plaque, Age if 65-74, or Male] Above score calculated as 2 points each if present [Age > 75, or Stroke/TIA/TE]   2.  Coronary artery disease status post CABG: No chest pain.  3.  Hypertension:  Well-controlled in the past   COVID 19 screen The patient denies symptoms of COVID 19 at this time.  The importance of social distancing was discussed today.  Follow-up: 6 months  Current medicines are reviewed at length with the patient today.   The patient does not have concerns regarding his medicines.  The following changes were made today: Stop metoprolol, start Toprol-XL  Labs/ tests ordered today include:  No orders of the defined types were placed in this encounter.    Patient Risk:  after full review of this patients clinical status, I feel that they are at moderate risk at this time.  Today, I have spent 10 minutes with the patient with telehealth technology discussing atrial fibrillation.    Signed, Prue Lingenfelter Jorja LoaMartin Karcyn Menn, MD  04/20/2019 12:30 PM     [facility] [address] [place] [address] [phone] (office) [phone] (fax)

## 2019-05-11 ENCOUNTER — Telehealth: Payer: Self-pay | Admitting: Cardiology

## 2019-05-11 NOTE — Telephone Encounter (Signed)
Pt reports HR elevated last night for several hours. He went on a picnic w/ his dtr yesterday for fathers day and had a big meal, when he got home, around 5pm, his HR was in low 100s.  Returned to normal by bedtime. Resting HRs 50-60s.  Activity HRs 70-80s. Reports this is first HR over 100 that he has noticed. Pt recently switched to Toprol, from Lopressor, for once a day dosing.  No issues r/t to change. Pt reports taking his Amiodarone.  Pt aware I will send to Dr. Curt Bears for advisement.  Pt aware that we may just continue to monitor for now and if continued elevated HR episodes then consider a monitor for evaluation. Pt aware I will f/u tomorrow once reviewed by physician.

## 2019-05-11 NOTE — Telephone Encounter (Signed)
STAT if HR is under 50 or over 120 (normal HR is 60-100 beats per minute)  1) What is your heart rate? 75  2) Do you have a log of your heart rate readings (document readings)? Resting in the low 60s,(64 avg) active in the 70s. One spike of active HR of over 100 which lasted almost two hours  3) Do you have any other symptoms? No  Patient just wants to know if Dr. Curt Bears has any recommendations if his heart rate goes high over 100 again and lasts for longer than an hour  Patient feels fine otherwise

## 2019-05-12 NOTE — Telephone Encounter (Signed)
Advised pt to continue to monitor for now, per Dr. Curt Bears..  Advised to call back if he continues to experience elevated HRs above 100 and we will order a monitor for evaluation. Patient verbalized understanding and agreeable to plan.

## 2019-06-30 ENCOUNTER — Other Ambulatory Visit: Payer: Self-pay | Admitting: Cardiology

## 2019-07-07 ENCOUNTER — Other Ambulatory Visit: Payer: Self-pay | Admitting: Cardiology

## 2019-07-28 ENCOUNTER — Other Ambulatory Visit: Payer: Self-pay | Admitting: Cardiology

## 2019-07-28 MED ORDER — AMIODARONE HCL 200 MG PO TABS: 200.0000 mg | ORAL_TABLET | Freq: Every day | ORAL | 0 refills | Status: DC

## 2019-07-28 NOTE — Telephone Encounter (Signed)
°*  STAT* If patient is at the pharmacy, call can be transferred to refill team.   1. Which medications need to be refilled? (please list name of each medication and dose if known) Amiodarone hcl 200mg   2. Which pharmacy/location (including street and city if local pharmacy) is medication to be sent to?[facility]  3. Do they need a 30 day or 90 day supply? [place]

## 2019-07-28 NOTE — Telephone Encounter (Signed)
Rx for amiodarone 200mg  one tablet daily sent to pharmacy #[id number] as patient is overdue for follow up appointment with Dr Bettina Gavia.

## 2019-07-30 NOTE — Telephone Encounter (Signed)
Lam x two days for patient to call back and schedule f/up appt

## 2019-07-30 NOTE — Telephone Encounter (Signed)
Spoke with patient, he is having Dr. Curt Bears fill his meds now so not needed

## 2019-08-10 ENCOUNTER — Telehealth: Payer: Self-pay | Admitting: Cardiology

## 2019-08-10 ENCOUNTER — Other Ambulatory Visit: Payer: Self-pay | Admitting: Cardiology

## 2019-08-10 ENCOUNTER — Other Ambulatory Visit: Payer: Self-pay | Admitting: *Deleted

## 2019-08-10 MED ORDER — XARELTO 20 MG PO TABS: 20.0000 mg | ORAL_TABLET | Freq: Every day | ORAL | 1 refills | Status: DC

## 2019-08-10 MED ORDER — AMIODARONE HCL 200 MG PO TABS: 200.0000 mg | ORAL_TABLET | Freq: Every day | ORAL | 2 refills | Status: DC

## 2019-08-10 NOTE — Telephone Encounter (Signed)
 *  STAT* If patient is at the pharmacy, call can be transferred to refill team.   1. Which medications need to be refilled? (please list name of each medication and dose if known) amiodarone (PACERONE) 200 MG tablet  2. Which pharmacy/location (including street and city if local pharmacy) is medication to be sent to? [facility]  3. Do they need a 30 day or 90 day supply? [place]

## 2019-08-10 NOTE — Telephone Encounter (Signed)
Xarelto  Refill sent with office visit needed for more refills to [facility]

## 2019-08-10 NOTE — Telephone Encounter (Signed)
Refills have been sent.  

## 2019-08-10 NOTE — Telephone Encounter (Signed)
°*  STAT* If patient is at the pharmacy, call can be transferred to refill team.   1. Which medications need to be refilled? (please list name of each medication and dose if known) XARELTO 20 MG TABS tablet   2. Which pharmacy/location (including street and city if local pharmacy) is medication to be sent to?  [facility] - [place], [place] - [address]  DEA #:  --    3. Do they need a 30 day or 90 day supply?

## 2019-10-08 ENCOUNTER — Other Ambulatory Visit: Payer: Self-pay | Admitting: Cardiology

## 2019-10-08 NOTE — Telephone Encounter (Signed)
Xarelto refill sent to [facility], [place]. Overdue for appt.

## 2019-10-26 ENCOUNTER — Encounter: Payer: Self-pay | Admitting: Cardiology

## 2019-10-26 ENCOUNTER — Other Ambulatory Visit: Payer: Self-pay

## 2019-10-26 ENCOUNTER — Ambulatory Visit (INDEPENDENT_AMBULATORY_CARE_PROVIDER_SITE_OTHER): Payer: Medicare Other | Admitting: Cardiology

## 2019-10-26 VITALS — BP 136/70 | HR 83 | Ht 70.0 in | Wt 186.1 lb

## 2019-10-26 DIAGNOSIS — I4819 Other persistent atrial fibrillation: Secondary | ICD-10-CM | POA: Diagnosis not present

## 2019-10-26 MED ORDER — XARELTO 20 MG PO TABS: 20.0000 mg | ORAL_TABLET | Freq: Every day | ORAL | 6 refills | Status: DC

## 2019-10-26 NOTE — Patient Instructions (Signed)
Medication Instructions:  Your physician has recommended you make the following change in your medication:  1. STOP Amiodarone  * If you need a refill on your cardiac medications before your next appointment, please call your pharmacy.   Labwork: None ordered  Testing/Procedures: None ordered  Follow-Up: Your physician wants you to follow-up in: 6 months with Dr. Curt Bears.  You will receive a reminder letter in the mail two months in advance. If you don't receive a letter, please call our office to schedule the follow-up appointment.   Thank you for choosing [facility]!!   Trinidad Curet, RN [phone]  Any Other Special Instructions Will Be Listed Below (If Applicable).

## 2019-10-26 NOTE — Progress Notes (Signed)
Electrophysiology Office Note   Date:  10/26/2019   ID:  Thomas Diaz, DOB 09/26/1932, MRN [id number]  PCP:  Patient, No Pcp Per  Cardiologist:  Dulce SellarMunley, telemetry Primary Electrophysiologist:  Dillyn Menna Jorja LoaMartin Garron Eline, MD    No chief complaint on file.    History of Present Illness: Thomas Diaz is a 85 y.o. male who is being seen today for the evaluation of atrial fibrillation at the request of Othella Boyerilley, William S, MD. Presenting today for electrophysiology evaluation.  He has a history of aortic stenosis status post TAVR, coronary artery disease status post CABG x5, hypertension, hyperlipidemia.  He is currently on amiodarone.  He was initially on a low-dose of amiodarone but went into atrial fibrillation.  His amiodarone dose was increased.  He had a cardioversion 12/10/2018, but unfortunately went back into atrial fibrillation.  He is in sinus rhythm today.  He is feeling well.  He goes on walks to see his wife in memory care.  He walks, according to him, a few miles a day.  He was diagnosed with atrial fibrillation after his CABG.  He was put on amiodarone a few years ago which was decreased to 100 mg by Dr. Donnie Ahoilley, but it has been increased since going back into atrial fibrillation.  Today, denies symptoms of palpitations, chest pain, shortness of breath, orthopnea, PND, lower extremity edema, claudication, dizziness, presyncope, syncope, bleeding, or neurologic sequela. The patient is tolerating medications without difficulties.  He currently feels well.  He is unfortunately in atrial fibrillation.  He is unaware of his arrhythmia.  He does state that he had previously been walking quite a bit but has been the less steady.  I do not feel that his atrial fibrillation is causing this.  I have asked him to speak with his primary physician about this.   Past Medical History:  Diagnosis Date  . Aortic stenosis 07/06/2013   Echocardiogram on 06/19/13 and showed mild to moderate aortic stenosis.   His ejection fraction is 55-60%. >> s/p TAVR 05/2017  . BPH (benign prostatic hyperplasia) 05/30/2011  . CAD (coronary artery disease), native coronary artery 03/07/2017   Cath 2009 with 60% LM and 95% RCA  CABG 6//19/09 with LIMA to LAD, SVG to RCA, SVG to int-OM and SVG to Dx Dr. Tyrone SageGerhardt  . ED (erectile dysfunction)   . 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 Gout   . Heart murmur    had all his life  . Hyperlipidemia   . Hypertension   . OA (osteoarthritis)   . PAC (premature atrial contraction)   . Persistent atrial fibrillation (HCC) 06/16/2013   CHA2DS2VASC score 4 Cardioversion 03/07/17 with reversion in one week Cardioversion 04/10/17 after loading with amiodarone with sinus   . 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 CARDIOVASCULAR STRESS TEST  11/03/04   EF 66%  . CARDIOVERSION N/A 03/07/2017   Procedure: CARDIOVERSION;  Surgeon: Othella BoyerWilliam S Tilley, MD;  [facility];  Service: Cardiovascular;  Laterality: N/A;  . CARDIOVERSION N/A 04/10/2017   Procedure: CARDIOVERSION;  Surgeon: Othella Boyerilley, William S, MD;  [facility];  Service: Cardiovascular;  Laterality: N/A;  . CARDIOVERSION N/A 12/10/2018   Procedure: CARDIOVERSION;  Surgeon: Sueanne Margarita, MD;  Location: [facility] ENDOSCOPY;  Service: Cardiovascular;  Laterality: N/A;  . COLONOSCOPY  2000  . CORONARY ARTERY BYPASS GRAFT  04/2008  . RIGHT/LEFT HEART CATH AND CORONARY/GRAFT ANGIOGRAPHY N/A 05/09/2017   Procedure: Right/Left Heart Cath and Coronary/Graft Angiography;  Surgeon: Troy Sine, MD;  Location: [place] CV LAB;  Service: Cardiovascular;  Laterality: N/A;  . TEE WITHOUT CARDIOVERSION  N/A 06/18/2017   Procedure: TRANSESOPHAGEAL ECHOCARDIOGRAM (TEE);  Surgeon: Sherren Mocha, MD;  Location: [place];  Service: Open Heart Surgery;  Laterality: N/A;  . TONSILLECTOMY AND ADENOIDECTOMY    . TRANSCATHETER AORTIC VALVE REPLACEMENT, TRANSFEMORAL N/A 06/18/2017   Procedure: TRANSCATHETER AORTIC VALVE REPLACEMENT, TRANSFEMORAL using an Edwards 26 mm Sapien 3 Aortic Valve;  Surgeon: Sherren Mocha, MD;  Location: [place];  Service: Open Heart Surgery;  Laterality: N/A;     Current Outpatient Medications  Medication Sig Dispense Refill  . amiodarone (PACERONE) 200 MG tablet Take 1 tablet (200 mg total) by mouth daily. *PLEASE BE SURE TO KEEP APPOINTMENT* 30 tablet 2  . diazepam (VALIUM) 5 MG tablet Take 5 mg by mouth at bedtime as needed (sleep).    . losartan (COZAAR) 50 MG tablet Take 1 tablet (50 mg total) by mouth daily. 90 tablet 3  . rosuvastatin (CRESTOR) 5 MG tablet Take 5 mg by mouth every Monday, Wednesday, and Friday.     Alveda Reasons 20 MG TABS tablet TAKE ONE (1) TABLET BY MOUTH EVERY DAY 15 tablet 0  . metoprolol succinate (TOPROL-XL) 50 MG 24 hr tablet Take 1 tablet (50 mg total) by mouth daily. Take with or immediately following a meal. 90 tablet 2   No current facility-administered medications for this visit.     Allergies:   Patient has no known allergies.   Social History:  The patient  reports that he has never smoked. He has never used smokeless tobacco. He reports that he does not drink alcohol or use drugs.   Family History:  The patient's family history includes Heart attack in his brother and father.    ROS:  Please see the history of present illness.   Otherwise, review of systems is positive for none.   All other systems are reviewed and negative.   PHYSICAL EXAM: VS:  BP 136/70   Pulse 83   Ht 5\' 10"  (1.778 m)   Wt 186 lb 1.9 oz (84.4 kg)   SpO2 98%   BMI 26.71 kg/m  , BMI Body mass index is 26.71 kg/m. GEN: Well nourished, well developed, in no acute  distress  HEENT: normal  Neck: no JVD, carotid bruits, or masses Cardiac: irregular; no murmurs, rubs, or gallops,no edema  Respiratory:  clear to auscultation bilaterally, normal work of breathing GI: soft, nontender, nondistended, + BS MS: no deformity or atrophy  Skin: warm and dry Neuro:  Strength and sensation are intact Psych: euthymic mood, full affect  EKG:  EKG is ordered today. Personal review of the ekg ordered shows fibrillation, rate 83  Recent Labs: 11/03/2018: ALT 27; BUN 17; Creatinine, Ser 1.26; TSH 1.400 12/10/2018: Hemoglobin 16.7; Potassium 4.4; Sodium 141    Lipid Panel     Component Value Date/Time   CHOL 159 11/03/2018 1612   TRIG 122 11/03/2018 1612   HDL 57 11/03/2018 1612   CHOLHDL 2.8 11/03/2018 1612   CHOLHDL 3 04/13/2015 0947   VLDL 14.6 04/13/2015 0947  LDLCALC 78 11/03/2018 1612     Wt Readings from Last 3 Encounters:  10/26/19 186 lb 1.9 oz (84.4 kg)  01/12/19 184 lb (83.5 kg)  12/10/18 180 lb (81.6 kg)      Other studies Reviewed: Additional studies/ records that were reviewed today include: TTE 01/01/19  Review of the above records today demonstrates:   1. The left ventricle has low normal systolic function, with an ejection fraction of 50-55%. The cavity size was normal. There is mildly increased left ventricular wall thickness. Left ventricular diastolic Doppler parameters are consistent with  pseudonormalization Elevated mean left atrial pressure.  2. The right ventricle has mildly reduced systolic function. The cavity was mildly enlarged. There is no increase in right ventricular wall thickness.  3. Left atrial size was moderately dilated.  4. Right atrial size was mildly dilated.  5. The mitral valve is normal in structure. There is mild mitral annular calcification present. No evidence of mitral valve stenosis. Mild regurgitation.  6. The tricuspid valve is normal in structure.  7. Bioprosthetic aortic valve s/p TAVR. Trivial  perivalvular regurgitation. Mean gradient across the valve of 12 mmHg.  8. The aortic root and ascending aorta are normal in size and structure.  9. Peak RV-RA gradient 30 mmHg. 10. Aortic valve regurgitation is trivial by color flow Doppler.   ASSESSMENT AND PLAN:  1.  Persistent atrial fibrillation: Currently on amiodarone and Xarelto.  CHA2DS2-VASc 4.  At this point he does not want any further cardioversions.  We Joaopedro Eschbach thus stop amiodarone and plan for a rate control strategy.  2.  Coronary artery disease status post CABG: No current chest pain  3.  Hypertension: Mildly elevated today but is usually better controlled.  No changes.  4.  Hyperlipidemia: Continue statin  Current medicines are reviewed at length with the patient today.   The patient does not have concerns regarding his medicines.  The following changes were made today: Stop amiodarone  Labs/ tests ordered today include:  Orders Placed This Encounter  Procedures  . EKG 12-Lead     Disposition:   FU with Serafina Topham 6 months  Signed, Zoha Spranger [name], MD  10/26/2019 2:31 PM     [facility] [address] [place] [address] [phone] (office) [phone] (fax)

## 2020-01-08 ENCOUNTER — Other Ambulatory Visit: Payer: Self-pay | Admitting: Cardiology

## 2020-01-26 DIAGNOSIS — D0472 Carcinoma in situ of skin of left lower limb, including hip: Secondary | ICD-10-CM | POA: Insufficient documentation

## 2020-01-26 DIAGNOSIS — F419 Anxiety disorder, unspecified: Secondary | ICD-10-CM | POA: Insufficient documentation

## 2020-05-02 ENCOUNTER — Other Ambulatory Visit: Payer: Self-pay

## 2020-05-02 ENCOUNTER — Ambulatory Visit (INDEPENDENT_AMBULATORY_CARE_PROVIDER_SITE_OTHER): Payer: Medicare Other | Admitting: Cardiology

## 2020-05-02 ENCOUNTER — Encounter: Payer: Self-pay | Admitting: Cardiology

## 2020-05-02 VITALS — BP 124/80 | HR 84 | Ht 70.0 in | Wt 180.1 lb

## 2020-05-02 DIAGNOSIS — I4821 Permanent atrial fibrillation: Secondary | ICD-10-CM

## 2020-05-02 NOTE — Progress Notes (Signed)
Electrophysiology Office Note   Date:  05/02/2020   ID:  Thomas Diaz, DOB December 25, 1931, MRN [id number]  PCP:  Nadara Eaton, MD  Cardiologist:  Dulce Sellar, telemetry Primary Electrophysiologist:  Kieren Adkison [name], MD    No chief complaint on file.    History of Present Illness: Thomas Diaz is a 86 y.o. male who is being seen today for the evaluation of atrial fibrillation at the request of No ref. provider found. Presenting today for electrophysiology evaluation.  He has a history of aortic stenosis status post TAVR, coronary artery disease status post CABG x5, hypertension, hyperlipidemia.  He is currently on amiodarone.  He was initially on a low-dose of amiodarone but went into atrial fibrillation.  His amiodarone dose was increased.  He had a cardioversion 12/10/2018, but unfortunately went back into atrial fibrillation.  He is in sinus rhythm today.  He is feeling well.  He goes on walks to see his wife in memory care.  He walks, according to him, a few miles a day.  He was diagnosed with atrial fibrillation after his CABG.  He was put on amiodarone a few years ago which was decreased to 100 mg by Dr. Donnie Aho, but it has been increased since going back into atrial fibrillation.  Today, denies symptoms of palpitations, chest pain, shortness of breath, orthopnea, PND, lower extremity edema, claudication, dizziness, presyncope, syncope, bleeding, or neurologic sequela. The patient is tolerating medications without difficulties.  Overall he is doing well.  He has no chest pain or shortness of breath.  He feels all of his daily activities without restriction.  Unfortunately, his wife died 4 months ago.  She had Alzheimer's dementia.  Past Medical History:  Diagnosis Date  . Aortic stenosis 07/06/2013   Echocardiogram on 06/19/13 and showed mild to moderate aortic stenosis.  His ejection fraction is 55-60%. >> s/p TAVR 05/2017  . BPH (benign prostatic hyperplasia) 05/30/2011  . CAD  (coronary artery disease), native coronary artery 03/07/2017   Cath 2009 with 60% LM and 95% RCA  CABG 6//19/09 with LIMA to LAD, SVG to RCA, SVG to int-OM and SVG to Dx Dr. Tyrone Sage  . ED (erectile dysfunction)   . 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 Gout   . Heart murmur    had all his life  . Hyperlipidemia   . Hypertension   . OA (osteoarthritis)   . PAC (premature atrial contraction)   . Persistent atrial fibrillation (HCC) 06/16/2013   CHA2DS2VASC score 4 Cardioversion 03/07/17 with reversion in one week Cardioversion 04/10/17 after loading with amiodarone with sinus   . S/P CABG x 5 05/12/2008   CABG x 5 - LIMA to LAD, SVG to Diag, Sequential SVG to ramus-OM1, SVG to RCA - Dr Tyrone Sage  . 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 CARDIOVASCULAR STRESS TEST  11/03/04   EF 66%  . CARDIOVERSION N/A 03/07/2017   Procedure: CARDIOVERSION;  Surgeon: [name], MD;  Location: [facility] ENDOSCOPY;  Service: Cardiovascular;  Laterality: N/A;  . CARDIOVERSION N/A 04/10/2017   Procedure: CARDIOVERSION;  Surgeon: [name], MD;  Location: [facility] ENDOSCOPY;  Service: Cardiovascular;  Laterality: N/A;  . CARDIOVERSION N/A 12/10/2018   Procedure: CARDIOVERSION;  Surgeon: Quintella Reichert, MD;  Location: [facility] ENDOSCOPY;  Service: Cardiovascular;  Laterality: N/A;  . COLONOSCOPY  2000  . CORONARY ARTERY BYPASS GRAFT  04/2008  . RIGHT/LEFT HEART CATH AND CORONARY/GRAFT ANGIOGRAPHY N/A 05/09/2017   Procedure: Right/Left Heart Cath and Coronary/Graft Angiography;  Surgeon: Lennette Bihari, MD;  Location: [facility] INVASIVE CV LAB;  Service: Cardiovascular;  Laterality: N/A;  . TEE WITHOUT CARDIOVERSION N/A 06/18/2017   Procedure: TRANSESOPHAGEAL ECHOCARDIOGRAM (TEE);  Surgeon: Tonny Bollman, MD;  [facility];  Service: Open Heart Surgery;  Laterality: N/A;  . TONSILLECTOMY AND ADENOIDECTOMY    . TRANSCATHETER AORTIC VALVE REPLACEMENT, TRANSFEMORAL N/A 06/18/2017   Procedure: TRANSCATHETER AORTIC VALVE REPLACEMENT, TRANSFEMORAL using an Edwards 26 mm Sapien 3 Aortic Valve;  Surgeon: Tonny Bollman, MD;  Location: [facility] OR;  Service: Open Heart Surgery;  Laterality: N/A;     Current Outpatient Medications  Medication Sig Dispense Refill  . diazepam (VALIUM) 5 MG tablet Take 5 mg by mouth at bedtime as needed (sleep).    . losartan (COZAAR) 50 MG tablet Take 1 tablet (50 mg total) by mouth daily. 90 tablet 3  . metoprolol succinate (TOPROL-XL) 50 MG 24 hr tablet TAKE ONE (1) TABLET BY MOUTH EACH DAY WITH OR IMMEDIATELY FOLLOWING A MEAL 90 tablet 3  . rosuvastatin (CRESTOR) 5 MG tablet Take 5 mg by mouth every Monday, Wednesday, and Friday.     Carlena Hurl 20 MG TABS tablet Take 1 tablet (20 mg total) by mouth daily with supper. 30 tablet 6   No current facility-administered medications for this visit.    Allergies:   Patient has no known allergies.   Social History:  The patient  reports that he has never smoked. He has never used smokeless tobacco. He reports that he does not drink alcohol and does not use drugs.   Family History:  The patient's family history includes Heart attack in his brother and father.   ROS:  Please see the history of present illness.   Otherwise, review of systems is positive for none.   All other systems are reviewed and negative.   PHYSICAL EXAM: VS:  BP 124/80   Pulse 84   Ht 5\' 10"  (1.778 m)   Wt 180 lb 1.9 oz (81.7 kg)   SpO2 98%   BMI 25.84 kg/m  , BMI Body mass index is 25.84 kg/m. GEN: Well nourished, well developed, in no acute distress  HEENT: normal  Neck: no JVD, carotid bruits, or masses Cardiac: RRR; no murmurs, rubs, or gallops,no edema  Respiratory:  clear to auscultation bilaterally, normal work of breathing GI: soft, nontender,  nondistended, + BS MS: no deformity or atrophy  Skin: warm and dry Neuro:  Strength and sensation are intact Psych: euthymic mood, full affect  EKG:  EKG is ordered today. Personal review of the ekg ordered shows Atrial fibrillation, rate 84, T wave inversions   Recent Labs: No results found for requested labs within last 8760 hours.    Lipid Panel     Component Value Date/Time   CHOL 159 11/03/2018 1612   TRIG 122 11/03/2018 1612   HDL 57 11/03/2018 1612   CHOLHDL 2.8 11/03/2018 1612   CHOLHDL 3 04/13/2015 0947   VLDL 14.6 04/13/2015 0947   LDLCALC 78 11/03/2018 1612     Wt Readings from Last 3 Encounters:  05/02/20 180 lb 1.9 oz (81.7 kg)  10/26/19 186 lb 1.9 oz (84.4 kg)  01/12/19 184 lb (83.5 kg)      Other studies Reviewed: Additional studies/ records that were reviewed  today include: TTE 01/01/19  Review of the above records today demonstrates:   1. The left ventricle has low normal systolic function, with an ejection fraction of 50-55%. The cavity size was normal. There is mildly increased left ventricular wall thickness. Left ventricular diastolic Doppler parameters are consistent with  pseudonormalization Elevated mean left atrial pressure.  2. The right ventricle has mildly reduced systolic function. The cavity was mildly enlarged. There is no increase in right ventricular wall thickness.  3. Left atrial size was moderately dilated.  4. Right atrial size was mildly dilated.  5. The mitral valve is normal in structure. There is mild mitral annular calcification present. No evidence of mitral valve stenosis. Mild regurgitation.  6. The tricuspid valve is normal in structure.  7. Bioprosthetic aortic valve s/p TAVR. Trivial perivalvular regurgitation. Mean gradient across the valve of 12 mmHg.  8. The aortic root and ascending aorta are normal in size and structure.  9. Peak RV-RA gradient 30 mmHg. 10. Aortic valve regurgitation is trivial by color flow  Doppler.   ASSESSMENT AND PLAN:  1.  Permanent atrial fibrillation: Currently on Xarelto with a CHA2DS2-VASc of 4.  He has decided on a rate control strategy.  Currently feeling well.  No changes.  2.  Coronary artery disease status post CABG: No current ischemic symptoms.  3.  Hypertension: Currently well controlled  4.  Hyperlipidemia: Continue statin  5.  Aortic stenosis status post TAVR: No murmur on exam today.  Stable on past echoes.  No changes.  Current medicines are reviewed at length with the patient today.   The patient does not have concerns regarding his medicines.  The following changes were made today: None  Labs/ tests ordered today include:  Orders Placed This Encounter  Procedures  . EKG 12-Lead     Disposition:   FU with Adilynn Bessey 12 months  Signed, Jrake Rodriquez Meredith Leeds, MD  05/02/2020 3:53 PM     [place] [address] [place] [address] [phone] (office) [phone] (fax)

## 2020-05-18 ENCOUNTER — Other Ambulatory Visit: Payer: Self-pay | Admitting: Cardiology

## 2020-05-18 NOTE — Telephone Encounter (Signed)
Pt last saw Dr Elberta Fortis 05/02/20, last labs 01/18/20 Creat 1.19 per care everywhere, age 86, weight 81.7kg, CrCl 51.49, based on CrCl pt is on appropriate dosage of Xarelto 20mg  QD.  Will refill rx.

## 2020-12-17 ENCOUNTER — Other Ambulatory Visit: Payer: Self-pay | Admitting: Cardiology

## 2020-12-19 NOTE — Telephone Encounter (Signed)
Prescription refill request for Xarelto received.   Indication:Afib Last office visit: [place], 05/02/2020 Weight: 81.7 kg  Age: 87 yo Scr: 1.19, 01/18/2020 CrCl: 51 ml/min   Prescription refill sent.

## 2021-01-03 ENCOUNTER — Telehealth: Payer: Self-pay | Admitting: Cardiology

## 2021-01-03 NOTE — Telephone Encounter (Signed)
Pt c/o medication issue:  1. Name of Medication: XARELTO 20 MG TABS tablet  2. How are you currently taking this medication (dosage and times per day)? As prescribed  3. Are you having a reaction (difficulty breathing--STAT)? No   4. What is your medication issue?   Patient states he switched from [place] to [facility]. [facility] is requesting a prior authorization from Dr. Elberta Fortis in order to cover this medication. [facility] can be contacted at [phone]. Patient also requested a confirmation call.

## 2021-01-03 NOTE — Telephone Encounter (Signed)
**Note De-Identified Dasani Thurlow Obfuscation** The pt provided his [employer] ins information as follows: ID: [id number] PCN: [id number] GRP: [id number]  I did the Xarelto PA through covermymeds and received this message:  Pincus Badder Key: [id number] - PA Case ID: [id number] Outcome This medication or product is on your plan's list of covered drugs. Prior authorization is not required at this time. If your pharmacy has questions regarding the processing of your prescription, please have them call the [facility] pharmacy help desk at [phone]. **Please note: This request was submitted electronically. Formulary lowering, tiering exception, cost reduction and/or pre-benefit determination review (including prospective Medicare hospice reviews) requests cannot be requested using this method of submission. Providers contact us at [phone] for further assistance. Drug: Xarelto 20MG  tablets Form: [facility] Medicare Part D Electronic Prior Authorization Form (2017 NCPDP)  I have made the pt aware of this outcome and he expressed much gratitude towards Dr 09-08-2005 and his staff.

## 2021-01-11 ENCOUNTER — Other Ambulatory Visit: Payer: Self-pay | Admitting: Cardiology

## 2021-03-14 DIAGNOSIS — N1831 Chronic kidney disease, stage 3a: Secondary | ICD-10-CM | POA: Insufficient documentation

## 2021-03-14 DIAGNOSIS — D696 Thrombocytopenia, unspecified: Secondary | ICD-10-CM | POA: Insufficient documentation

## 2021-05-15 ENCOUNTER — Ambulatory Visit: Payer: Medicare Other | Admitting: Cardiology

## 2021-05-15 ENCOUNTER — Encounter: Payer: Self-pay | Admitting: Cardiology

## 2021-05-15 ENCOUNTER — Other Ambulatory Visit: Payer: Self-pay

## 2021-05-15 VITALS — BP 144/88 | HR 86 | Ht 70.0 in | Wt 191.0 lb

## 2021-05-15 DIAGNOSIS — I4821 Permanent atrial fibrillation: Secondary | ICD-10-CM | POA: Diagnosis not present

## 2021-05-15 NOTE — Progress Notes (Signed)
Table   Electrophysiology Office Note   Date:  05/15/2021   ID:  Thomas Diaz, DOB September 08, 1932, MRN [id number]  PCP:  Nadara Eaton, MD  Cardiologist:  Dulce Sellar Primary Electrophysiologist:  Jw Covin [name], MD    No chief complaint on file.    History of Present Illness: Thomas Diaz is a 87 y.o. male who is being seen today for the evaluation of atrial fibrillation at the request of Nadara Eaton, MD. Presenting today for electrophysiology evaluation.    He has a history of aortic stenosis status post TAVR, coronary artery disease status post CABG x5, hypertension.  He was initially low-dose amiodarone but was Of atrial fibrillation.  Amiodarone was increased and he had a cardioversion on 12/10/2018.  Unfortunately back into atrial fibrillation.  He is now on rate control strategy.  Today, denies symptoms of palpitations, chest pain, shortness of breath, orthopnea, PND, lower extremity edema, claudication, dizziness, presyncope, syncope, bleeding, or neurologic sequela. The patient is tolerating medications without difficulties.  Since being seen he has done well.  He has no chest pain or shortness of breath.  He is able to all of his daily activities without restriction.  He walks approximately a mile a day.  Past Medical History:  Diagnosis Date   Aortic stenosis 07/06/2013   Echocardiogram on 06/19/13 and showed mild to moderate aortic stenosis.  His ejection fraction is 55-60%. >> s/p TAVR 05/2017   BPH (benign prostatic hyperplasia) 05/30/2011   CAD (coronary artery disease), native coronary artery 03/07/2017   Cath 2009 with 60% LM and 95% RCA  CABG 6//19/09 with LIMA to LAD, SVG to RCA, SVG to int-OM and SVG to Dx Dr. 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   patient has never been told this   Gout    Heart murmur    had all his life   Hyperlipidemia    Hypertension    OA (osteoarthritis)    PAC (premature atrial contraction)     Persistent atrial fibrillation (HCC) 06/16/2013   CHA2DS2VASC score 4 Cardioversion 03/07/17 with reversion in one week Cardioversion 04/10/17 after loading with amiodarone with sinus    S/P CABG x 5 05/12/2008   CABG x 5 - LIMA to LAD, SVG to Diag, Sequential SVG to ramus-OM1, SVG to RCA - Dr Tyrone Sage   S/P TAVR (transcatheter aortic valve replacement) 06/18/2017   26 mm Edwards Sapien 3 transcatheter heart valve placed via percutaneous right transfemoral approach    Past Surgical History:  Procedure Laterality Date   CARDIOVASCULAR STRESS TEST  11/03/04   EF 66%   CARDIOVERSION N/A 03/07/2017   Procedure: CARDIOVERSION;  Surgeon: [name], MD;  Location: [facility] ENDOSCOPY;  Service: Cardiovascular;  Laterality: N/A;   CARDIOVERSION N/A 04/10/2017   Procedure: CARDIOVERSION;  Surgeon: [name], MD;  Location: [facility] ENDOSCOPY;  Service: Cardiovascular;  Laterality: N/A;   CARDIOVERSION N/A 12/10/2018   Procedure: CARDIOVERSION;  Surgeon: Quintella Reichert, MD;  Location: [facility] ENDOSCOPY;  Service: Cardiovascular;  Laterality: N/A;   COLONOSCOPY  2000   CORONARY ARTERY BYPASS GRAFT  04/2008   RIGHT/LEFT HEART CATH AND CORONARY/GRAFT ANGIOGRAPHY N/A 05/09/2017   Procedure: Right/Left Heart Cath and Coronary/Graft Angiography;  Surgeon: Lennette Bihari, MD;  Location: [facility] INVASIVE CV LAB;  Service: Cardiovascular;  Laterality: N/A;   TEE WITHOUT CARDIOVERSION N/A 06/18/2017   Procedure: TRANSESOPHAGEAL ECHOCARDIOGRAM (TEE);  Surgeon: Tonny Bollman, MD;  Location: [facility] OR;  Service:  Open Heart Surgery;  Laterality: N/A;   TONSILLECTOMY AND ADENOIDECTOMY     TRANSCATHETER AORTIC VALVE REPLACEMENT, TRANSFEMORAL N/A 06/18/2017   Procedure: TRANSCATHETER AORTIC VALVE REPLACEMENT, TRANSFEMORAL using an Edwards 26 mm Sapien 3 Aortic Valve;  Surgeon: Tonny Bollman, MD;  Location: [facility] OR;  Service: Open Heart Surgery;  Laterality: N/A;     Current Outpatient Medications  Medication Sig Dispense Refill    diazepam (VALIUM) 5 MG tablet Take 5 mg by mouth at bedtime as needed (sleep).     losartan (COZAAR) 50 MG tablet Take 1 tablet (50 mg total) by mouth daily. 90 tablet 3   metoprolol succinate (TOPROL-XL) 50 MG 24 hr tablet TAKE ONE (1) TABLET BY MOUTH EACH DAY WITH OR IMMEDIATELY FOLLOWING A MEAL 90 tablet 1   rosuvastatin (CRESTOR) 5 MG tablet Take 5 mg by mouth every Monday, Wednesday, and Friday.      XARELTO 20 MG TABS tablet TAKE ONE (1) TABLET BY MOUTH EVERY DAY WITH SUPPER 30 tablet 5   No current facility-administered medications for this visit.    Allergies:   Patient has no known allergies.   Social History:  The patient  reports that he has never smoked. He has never used smokeless tobacco. He reports that he does not drink alcohol and does not use drugs.   Family History:  The patient's family history includes Heart attack in his brother and father.   ROS:  Please see the history of present illness.   Otherwise, review of systems is positive for none.   All other systems are reviewed and negative.   PHYSICAL EXAM: VS:  BP (!) 144/88   Pulse 86   Ht 5\' 10"  (1.778 m)   Wt 191 lb (86.6 kg)   SpO2 93%   BMI 27.41 kg/m  , BMI Body mass index is 27.41 kg/m. GEN: Well nourished, well developed, in no acute distress  HEENT: normal  Neck: no JVD, carotid bruits, or masses Cardiac: irregular; no murmurs, rubs, or gallops,no edema  Respiratory:  clear to auscultation bilaterally, normal work of breathing GI: soft, nontender, nondistended, + BS MS: no deformity or atrophy  Skin: warm and dry Neuro:  Strength and sensation are intact Psych: euthymic mood, full affect  EKG:  EKG is ordered today. Personal review of the ekg ordered shows atrial fibrillation, rate 86  Recent Labs: No results found for requested labs within last 8760 hours.    Lipid Panel     Component Value Date/Time   CHOL 159 11/03/2018 1612   TRIG 122 11/03/2018 1612   HDL 57 11/03/2018 1612    CHOLHDL 2.8 11/03/2018 1612   CHOLHDL 3 04/13/2015 0947   VLDL 14.6 04/13/2015 0947   LDLCALC 78 11/03/2018 1612     Wt Readings from Last 3 Encounters:  05/15/21 191 lb (86.6 kg)  05/02/20 180 lb 1.9 oz (81.7 kg)  10/26/19 186 lb 1.9 oz (84.4 kg)      Other studies Reviewed: Additional studies/ records that were reviewed today include: TTE 01/01/19  Review of the above records today demonstrates:   1. The left ventricle has low normal systolic function, with an ejection fraction of 50-55%. The cavity size was normal. There is mildly increased left ventricular wall thickness. Left ventricular diastolic Doppler parameters are consistent with  pseudonormalization Elevated mean left atrial pressure.  2. The right ventricle has mildly reduced systolic function. The cavity was mildly enlarged. There is no increase in right ventricular wall thickness.  3. Left atrial size was moderately dilated.  4. Right atrial size was mildly dilated.  5. The mitral valve is normal in structure. There is mild mitral annular calcification present. No evidence of mitral valve stenosis. Mild regurgitation.  6. The tricuspid valve is normal in structure.  7. Bioprosthetic aortic valve s/p TAVR. Trivial perivalvular regurgitation. Mean gradient across the valve of 12 mmHg.  8. The aortic root and ascending aorta are normal in size and structure.  9. Peak RV-RA gradient 30 mmHg. 10. Aortic valve regurgitation is trivial by color flow Doppler.   ASSESSMENT AND PLAN:  1.  Permanent atrial fibrillation: Currently on Xarelto.  CHA2DS2-VASc of 4.  Exercise on a rate control strategy.  Has been feeling well.  No changes.    2.  Coronary artery disease status post CABG: No current ischemic symptoms.  3.  Hypertension elevated today.  Usually well controlled.  No changes.  4.  Hyperlipidemia: Continue statin per primary cardiology  5.  Aortic stenosis status post TAVR: No murmur on exam.  No changes.     Current medicines are reviewed at length with the patient today.   The patient does not have concerns regarding his medicines.  The following changes were made today: None  Labs/ tests ordered today include:  Orders Placed This Encounter  Procedures   EKG 12-Lead      Disposition:   FU with Tally Mckinnon 12 months  Signed, Dontarious Schaum [name], MD  05/15/2021 4:00 PM     [facility] [address] [place] [address] [phone] (office) [phone] (fax)

## 2021-05-15 NOTE — Patient Instructions (Signed)
Medication Instructions:  Your physician recommends that you continue on your current medications as directed. Please refer to the Current Medication list given to you today.  *If you need a refill on your cardiac medications before your next appointment, please call your pharmacy*   Lab Work: None ordered   Testing/Procedures: None ordered   Follow-Up: At [facility], you and your health needs are our priority.  As part of our continuing mission to provide you with exceptional heart care, we have created designated Provider Care Teams.  These Care Teams include your primary Cardiologist (physician) and Advanced Practice Providers (APPs -  Physician Assistants and Nurse Practitioners) who all work together to provide you with the care you need, when you need it.  We recommend signing up for the patient portal called "MyChart".  Sign up information is provided on this After Visit Summary.  MyChart is used to connect with patients for Virtual Visits (Telemedicine).  Patients are able to view lab/test results, encounter notes, upcoming appointments, etc.  Non-urgent messages can be sent to your provider as well.   To learn more about what you can do with MyChart, go to [email or web address].    Your next appointment:   1 year(s)  The format for your next appointment:   In Person  Provider:   Will Camnitz, MD    Thank you for choosing [facility]!!   Panhia Karl, RN [phone]    

## 2021-06-09 ENCOUNTER — Telehealth: Payer: Self-pay | Admitting: Cardiology

## 2021-06-09 NOTE — Telephone Encounter (Signed)
New Message:     Patient says he think he have Covid. He is concerned, because his heart rate seems to be up a lot.   STAT if HR is under 50 or over 120 (normal HR is 60-100 beats per minute)  What is your heart rate? Right now it is  100 It have been up in the 90's and 100's  Do you have a log of your heart rate readings (document readings)? No   Do you have any other symptoms?  tired

## 2021-06-09 NOTE — Telephone Encounter (Signed)
The patient got Covid on Sunday at a convention. He is just feeling weak heart rate has been 90's to 100's,  "which is high for me." No other symptoms.  Current: BP 121/75 HR 102. Will forward to MD for advisement. ED precautions given.

## 2021-06-14 ENCOUNTER — Other Ambulatory Visit: Payer: Self-pay | Admitting: Cardiology

## 2021-06-14 NOTE — Telephone Encounter (Signed)
Prescription refill request for Xarelto received.   Indication: afib  Last office visit: Camnitz, 05/15/2021 Weight: 86.6 kg  Age: 87 yo  Scr: 1.13, 02/22/2021 CrCl: 24ml/min    Pt is on the correct dose of Xarelto per dosing criteria, prescription refill sent for Xarelto 20mg  daily.

## 2021-12-19 ENCOUNTER — Other Ambulatory Visit: Payer: Self-pay | Admitting: Cardiology

## 2021-12-19 NOTE — Telephone Encounter (Signed)
Pt last saw Dr Elberta Fortis 05/15/21, last labs 02/22/21 Creat 1.13, age 88, weight 86.6kg, CrCl 56.41, based on CrCl pt is on appropriate dosage of Xarelto 20mg  QD for afib.  Will refill rx.

## 2022-01-03 ENCOUNTER — Other Ambulatory Visit: Payer: Self-pay | Admitting: Cardiology

## 2022-03-30 ENCOUNTER — Other Ambulatory Visit: Payer: Self-pay | Admitting: Cardiology

## 2022-06-04 ENCOUNTER — Ambulatory Visit: Payer: Medicare Other | Admitting: Cardiology

## 2022-06-20 ENCOUNTER — Other Ambulatory Visit: Payer: Self-pay | Admitting: Cardiology

## 2022-06-20 DIAGNOSIS — I480 Paroxysmal atrial fibrillation: Secondary | ICD-10-CM

## 2022-06-20 NOTE — Telephone Encounter (Signed)
Prescription refill request for Xarelto received.  Indication: Afib  Last office visit:05/15/21 (Camnitz)  Weight: 86.6kg Age: 88 Scr: 1.11 (03/06/22)  CrCl: 65.57ml/min  Appropriate dose and refill sent to requested pharmacy.

## 2022-06-25 ENCOUNTER — Ambulatory Visit (INDEPENDENT_AMBULATORY_CARE_PROVIDER_SITE_OTHER): Payer: Medicare Other | Admitting: Cardiology

## 2022-06-25 ENCOUNTER — Encounter: Payer: Self-pay | Admitting: Cardiology

## 2022-06-25 VITALS — BP 142/84 | HR 82 | Ht 70.0 in | Wt 184.0 lb

## 2022-06-25 DIAGNOSIS — I4821 Permanent atrial fibrillation: Secondary | ICD-10-CM | POA: Diagnosis not present

## 2022-06-25 DIAGNOSIS — D6869 Other thrombophilia: Secondary | ICD-10-CM | POA: Diagnosis not present

## 2022-06-25 NOTE — Progress Notes (Signed)
Table   Electrophysiology Office Note   Date:  06/25/2022   ID:  FED CECI, DOB [birth year], MRN [id number]  PCP:  Nadara Eaton, MD  Cardiologist:  Dulce Sellar Primary Electrophysiologist:  Oluwatoyin Banales [name], MD    No chief complaint on file.     History of Present Illness: Thomas Diaz is a 88 y.o. male who is being seen today for the evaluation of atrial fibrillation at the request of Nadara Eaton, MD. Presenting today for electrophysiology evaluation.    He has a history sooner for aortic stenosis status post TAVR, coronary artery disease status post CABG x 5, hypertension.  He was initially on low-dose amiodarone but continued to have episodes of atrial fibrillation.  He is now on a rate control strategy.  Today, denies symptoms of palpitations, chest pain, shortness of breath, orthopnea, PND, lower extremity edema, claudication, dizziness, presyncope, syncope, bleeding, or neurologic sequela. The patient is tolerating medications without difficulties.  Since being seen he has done well.  He has had no chest pain or shortness of breath.  He is able do all of his daily activities without restriction.  He is overall quite happy with his control.   Past Medical History:  Diagnosis Date   Aortic stenosis 07/06/2013   Echocardiogram on 06/19/13 and showed mild to moderate aortic stenosis.  His ejection fraction is 55-60%. >> s/p TAVR 05/2017   BPH (benign prostatic hyperplasia) 05/30/2011   CAD (coronary artery disease), native coronary artery 03/07/2017   Cath 2009 with 60% LM and 95% RCA  CABG 6//19/09 with LIMA to LAD, SVG to RCA, SVG to int-OM and SVG to Dx Dr. 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   patient has never been told this   Gout    Heart murmur    had all his life   Hyperlipidemia    Hypertension    OA (osteoarthritis)    PAC (premature atrial contraction)    Persistent atrial fibrillation (HCC) 06/16/2013   CHA2DS2VASC score  4 Cardioversion 03/07/17 with reversion in one week Cardioversion 04/10/17 after loading with amiodarone with sinus    S/P CABG x 5 05/12/2008   CABG x 5 - LIMA to LAD, SVG to Diag, Sequential SVG to ramus-OM1, SVG to RCA - Dr Tyrone Sage   S/P TAVR (transcatheter aortic valve replacement) 06/18/2017   26 mm Edwards Sapien 3 transcatheter heart valve placed via percutaneous right transfemoral approach    Past Surgical History:  Procedure Laterality Date   CARDIOVASCULAR STRESS TEST  11/03/04   EF 66%   CARDIOVERSION N/A 03/07/2017   Procedure: CARDIOVERSION;  Surgeon: [name], MD;  Location: [facility] ENDOSCOPY;  Service: Cardiovascular;  Laterality: N/A;   CARDIOVERSION N/A 04/10/2017   Procedure: CARDIOVERSION;  Surgeon: [name], MD;  Location: [facility] ENDOSCOPY;  Service: Cardiovascular;  Laterality: N/A;   CARDIOVERSION N/A 12/10/2018   Procedure: CARDIOVERSION;  Surgeon: Quintella Reichert, MD;  Location: [facility] ENDOSCOPY;  Service: Cardiovascular;  Laterality: N/A;   COLONOSCOPY  2000   CORONARY ARTERY BYPASS GRAFT  04/2008   RIGHT/LEFT HEART CATH AND CORONARY/GRAFT ANGIOGRAPHY N/A 05/09/2017   Procedure: Right/Left Heart Cath and Coronary/Graft Angiography;  Surgeon: Lennette Bihari, MD;  Location: [facility] INVASIVE CV LAB;  Service: Cardiovascular;  Laterality: N/A;   TEE WITHOUT CARDIOVERSION N/A 06/18/2017   Procedure: TRANSESOPHAGEAL ECHOCARDIOGRAM (TEE);  Surgeon: Tonny Bollman, MD;  Location: [facility] OR;  Service: Open Heart Surgery;  Laterality: N/A;  TONSILLECTOMY AND ADENOIDECTOMY     TRANSCATHETER AORTIC VALVE REPLACEMENT, TRANSFEMORAL N/A 06/18/2017   Procedure: TRANSCATHETER AORTIC VALVE REPLACEMENT, TRANSFEMORAL using an Edwards 26 mm Sapien 3 Aortic Valve;  Surgeon: Sherren Mocha, MD;  Location: [place];  Service: Open Heart Surgery;  Laterality: N/A;     Current Outpatient Medications  Medication Sig Dispense Refill   colchicine 0.6 MG tablet Take 0.6 mg by mouth 2 (two) times daily as  needed.     diazepam (VALIUM) 5 MG tablet Take 5 mg by mouth at bedtime as needed (sleep).     losartan (COZAAR) 50 MG tablet Take 1 tablet (50 mg total) by mouth daily. 90 tablet 3   metoprolol succinate (TOPROL-XL) 50 MG 24 hr tablet TAKE ONE (1) TABLET BY MOUTH EACH DAY WITH OR IMMEDIATELY FOLLOWING A MEAL 90 tablet 2   rosuvastatin (CRESTOR) 5 MG tablet Take 5 mg by mouth every Monday, Wednesday, and Friday.      TART CHERRY PO Take 8 oz by mouth daily.     XARELTO 20 MG TABS tablet TAKE ONE (1) TABLET BY MOUTH EVERY DAY WITH SUPPER 30 tablet 5   No current facility-administered medications for this visit.    Allergies:   Patient has no known allergies.   Social History:  The patient  reports that he has never smoked. He has never used smokeless tobacco. He reports that he does not drink alcohol and does not use drugs.   Family History:  The patient's family history includes Heart attack in his brother and father.   ROS:  Please see the history of present illness.   Otherwise, review of systems is positive for none.   All other systems are reviewed and negative.   PHYSICAL EXAM: VS:  BP (!) 142/84   Pulse 82   Ht 5\' 10"  (1.778 m)   Wt 184 lb (83.5 kg)   SpO2 92%   BMI 26.40 kg/m  , BMI Body mass index is 26.4 kg/m. GEN: Well nourished, well developed, in no acute distress  HEENT: normal  Neck: no JVD, carotid bruits, or masses Cardiac: irregular; no murmurs, rubs, or gallops,no edema  Respiratory:  clear to auscultation bilaterally, normal work of breathing GI: soft, nontender, nondistended, + BS MS: no deformity or atrophy  Skin: warm and dry Neuro:  Strength and sensation are intact Psych: euthymic mood, full affect  EKG:  EKG is ordered today. Personal review of the ekg ordered shows atrial fibrillation  Recent Labs: No results found for requested labs within last 365 days.    Lipid Panel     Component Value Date/Time   CHOL 159 11/03/2018 1612   TRIG 122  11/03/2018 1612   HDL 57 11/03/2018 1612   CHOLHDL 2.8 11/03/2018 1612   CHOLHDL 3 04/13/2015 0947   VLDL 14.6 04/13/2015 0947   LDLCALC 78 11/03/2018 1612     Wt Readings from Last 3 Encounters:  06/25/22 184 lb (83.5 kg)  05/15/21 191 lb (86.6 kg)  05/02/20 180 lb 1.9 oz (81.7 kg)      Other studies Reviewed: Additional studies/ records that were reviewed today include: TTE 01/01/19  Review of the above records today demonstrates:   1. The left ventricle has low normal systolic function, with an ejection fraction of 50-55%. The cavity size was normal. There is mildly increased left ventricular wall thickness. Left ventricular diastolic Doppler parameters are consistent with  pseudonormalization Elevated mean left atrial pressure.  2. The right ventricle  has mildly reduced systolic function. The cavity was mildly enlarged. There is no increase in right ventricular wall thickness.  3. Left atrial size was moderately dilated.  4. Right atrial size was mildly dilated.  5. The mitral valve is normal in structure. There is mild mitral annular calcification present. No evidence of mitral valve stenosis. Mild regurgitation.  6. The tricuspid valve is normal in structure.  7. Bioprosthetic aortic valve s/p TAVR. Trivial perivalvular regurgitation. Mean gradient across the valve of 12 mmHg.  8. The aortic root and ascending aorta are normal in size and structure.  9. Peak RV-RA gradient 30 mmHg. 10. Aortic valve regurgitation is trivial by color flow Doppler.   ASSESSMENT AND PLAN:  1.  Permanent atrial fibrillation: Currently on Xarelto 20 mg daily.  CHA2DS2-VASc 4.  Well rate controlled.  No changes.  2.  Coronary artery disease: Post CABG.  No current ischemic symptoms  3.  Hypertension: Mildly elevated today.  Usually well-controlled.  No changes.  Managed by primary care.  4.  Hyperlipidemia: Continue statin per primary cardiology  5.  Aortic stenosis: Status post TAVR.  No  murmur on exam.  No changes.  6.  Secondary hypercoagulable state: Currently on Xarelto for atrial fibrillation as above  Current medicines are reviewed at length with the patient today.   The patient does not have concerns regarding his medicines.  The following changes were made today: None  Labs/ tests ordered today include:  Orders Placed This Encounter  Procedures   EKG 12-Lead      Disposition:   FU with Mikyle Sox 12 months  Signed, Sher Hellinger [name], MD  06/25/2022 3:53 PM     [facility] [address] [place] [address] [phone] (office) [phone] (fax)

## 2022-06-25 NOTE — Patient Instructions (Signed)
Medication Instructions:  Your physician recommends that you continue on your current medications as directed. Please refer to the Current Medication list given to you today.  *If you need a refill on your cardiac medications before your next appointment, please call your pharmacy*   Lab Work: None ordered.  If you have labs (blood work) drawn today and your tests are completely normal, you will receive your results only by: MyChart Message (if you have MyChart) OR A paper copy in the mail If you have any lab test that is abnormal or we need to change your treatment, we will call you to review the results.   Testing/Procedures: None ordered.    Follow-Up: At [facility], you and your health needs are our priority.  As part of our continuing mission to provide you with exceptional heart care, we have created designated Provider Care Teams.  These Care Teams include your primary Cardiologist (physician) and Advanced Practice Providers (APPs -  Physician Assistants and Nurse Practitioners) who all work together to provide you with the care you need, when you need it.  We recommend signing up for the patient portal called "MyChart".  Sign up information is provided on this After Visit Summary.  MyChart is used to connect with patients for Virtual Visits (Telemedicine).  Patients are able to view lab/test results, encounter notes, upcoming appointments, etc.  Non-urgent messages can be sent to your provider as well.   To learn more about what you can do with MyChart, go to [email or web address].    Your next appointment:   12 months with Dr Camnitz  Important Information About Sugar       

## 2022-11-06 ENCOUNTER — Telehealth: Payer: Self-pay | Admitting: Cardiology

## 2022-11-06 NOTE — Telephone Encounter (Signed)
Pt would like to know if provider is able to write a prescription for Cialis. He states that his PCP usually prescribes it but he has passed away. Please advise

## 2022-11-06 NOTE — Telephone Encounter (Signed)
Pt informed that he will need to go through PCP or someone in their practice for Rx/refills, per Dr. Elberta Fortis. Pt reports PCP has passed and was in practice by himself, but he appreciates my return call.  He will work something out for getting refill.

## 2022-12-14 ENCOUNTER — Other Ambulatory Visit: Payer: Self-pay | Admitting: Cardiology

## 2023-01-24 ENCOUNTER — Emergency Department (HOSPITAL_BASED_OUTPATIENT_CLINIC_OR_DEPARTMENT_OTHER): Payer: Medicare Other

## 2023-01-24 ENCOUNTER — Emergency Department (HOSPITAL_BASED_OUTPATIENT_CLINIC_OR_DEPARTMENT_OTHER)
Admission: EM | Admit: 2023-01-24 | Discharge: 2023-01-25 | Disposition: A | Discharge status: Home / Self Care | Payer: Medicare Other | Attending: Emergency Medicine | Admitting: Emergency Medicine

## 2023-01-24 ENCOUNTER — Other Ambulatory Visit: Payer: Self-pay

## 2023-01-24 ENCOUNTER — Encounter (HOSPITAL_BASED_OUTPATIENT_CLINIC_OR_DEPARTMENT_OTHER): Payer: Self-pay | Admitting: Emergency Medicine

## 2023-01-24 DIAGNOSIS — Z7901 Long term (current) use of anticoagulants: Secondary | ICD-10-CM | POA: Diagnosis not present

## 2023-01-24 DIAGNOSIS — S5012XA Contusion of left forearm, initial encounter: Secondary | ICD-10-CM | POA: Diagnosis not present

## 2023-01-24 DIAGNOSIS — L0390 Cellulitis, unspecified: Secondary | ICD-10-CM

## 2023-01-24 DIAGNOSIS — L03114 Cellulitis of left upper limb: Secondary | ICD-10-CM | POA: Diagnosis not present

## 2023-01-24 DIAGNOSIS — S0181XA Laceration without foreign body of other part of head, initial encounter: Secondary | ICD-10-CM | POA: Insufficient documentation

## 2023-01-24 DIAGNOSIS — S0990XA Unspecified injury of head, initial encounter: Secondary | ICD-10-CM | POA: Diagnosis present

## 2023-01-24 DIAGNOSIS — W19XXXA Unspecified fall, initial encounter: Secondary | ICD-10-CM | POA: Diagnosis not present

## 2023-01-24 LAB — CBC WITH DIFFERENTIAL/PLATELET
Abs Immature Granulocytes: 0.15 10*3/uL — ABNORMAL HIGH (ref 0.00–0.07)
Basophils Absolute: 0.1 10*3/uL (ref 0.0–0.1)
Basophils Relative: 1 %
Eosinophils Absolute: 0.2 10*3/uL (ref 0.0–0.5)
Eosinophils Relative: 3 %
HCT: 39.6 % (ref 39.0–52.0)
Hemoglobin: 13 g/dL (ref 13.0–17.0)
Immature Granulocytes: 2 %
Lymphocytes Relative: 18 %
Lymphs Abs: 1.4 10*3/uL (ref 0.7–4.0)
MCH: 30 pg (ref 26.0–34.0)
MCHC: 32.8 g/dL (ref 30.0–36.0)
MCV: 91.5 fL (ref 80.0–100.0)
Monocytes Absolute: 0.6 10*3/uL (ref 0.1–1.0)
Monocytes Relative: 8 %
Neutro Abs: 5.2 10*3/uL (ref 1.7–7.7)
Neutrophils Relative %: 68 %
Platelets: 204 10*3/uL (ref 150–400)
RBC: 4.33 MIL/uL (ref 4.22–5.81)
RDW: 13.4 % (ref 11.5–15.5)
WBC: 7.6 10*3/uL (ref 4.0–10.5)
nRBC: 0 % (ref 0.0–0.2)

## 2023-01-24 LAB — BASIC METABOLIC PANEL
Anion gap: 10 (ref 5–15)
BUN: 19 mg/dL (ref 8–23)
CO2: 26 mmol/L (ref 22–32)
Calcium: 9.3 mg/dL (ref 8.9–10.3)
Chloride: 106 mmol/L (ref 98–111)
Creatinine, Ser: 1.28 mg/dL — ABNORMAL HIGH (ref 0.61–1.24)
GFR, Estimated: 54 mL/min — ABNORMAL LOW (ref >60)
Glucose, Bld: 127 mg/dL — ABNORMAL HIGH (ref 70–99)
Potassium: 3.7 mmol/L (ref 3.5–5.1)
Sodium: 142 mmol/L (ref 135–145)

## 2023-01-24 MED ORDER — DOXYCYCLINE HYCLATE 100 MG PO CAPS: 100.0000 mg | ORAL_CAPSULE | Freq: Two times a day (BID) | ORAL | 0 refills | Status: AC

## 2023-01-24 MED ORDER — LIDOCAINE-PRILOCAINE 2.5-2.5 % EX CREA: TOPICAL_CREAM | Freq: Once | CUTANEOUS | Status: DC

## 2023-01-24 MED ORDER — DOXYCYCLINE HYCLATE 100 MG PO TABS
100.0000 mg | ORAL_TABLET | Freq: Once | ORAL | Status: AC
  Administered 2023-01-24: 100 mg via ORAL
  Filled 2023-01-24: qty 1

## 2023-01-24 MED ORDER — LIDOCAINE 4 % EX CREA
TOPICAL_CREAM | Freq: Once | CUTANEOUS | Status: AC
  Administered 2023-01-24: 1 via TOPICAL
  Filled 2023-01-24: qty 5

## 2023-01-24 NOTE — ED Notes (Addendum)
Dr. Tamera Punt at bedside for pt assessment.

## 2023-01-24 NOTE — ED Triage Notes (Addendum)
[place] about a week ago and hurt his left forearm , pt arrives with left lower arm with an ace wrapped, when unwrapped pt has about a palm size abrasion that has slight swelling and red , pt is on antiobiotics, has some minimal drainage

## 2023-01-24 NOTE — ED Provider Notes (Addendum)
[facility] Provider Note   CSN: [id number] Arrival date & time: 01/24/23  1827     History  Chief Complaint  Patient presents with   Fall   Arm Injury    Thomas Diaz is a 89 y.o. male.  Patient is a 89 year old male who presents with swelling and redness to his left forearm.  He states about 2 weeks ago he had a fall and had a tear in his skin over his left forearm.  He has been having it checked periodically throughout the last 2 weeks.  He had been on Keflex.  Recently its gotten more swollen.  He was sent to wound care clinic this morning and per his family member who is at bedside, was told that they cannot take care of it that he needs a hand specialist.  They did contact the hand specialist with [facility] and were told that the patient needs to go to the emergency room.  He denies any drainage.  No fevers.       Home Medications Prior to Admission medications   Medication Sig Start Date End Date Taking? Authorizing Provider  doxycycline (VIBRAMYCIN) 100 MG capsule Take 1 capsule (100 mg total) by mouth 2 (two) times daily. One po bid x 7 days 01/24/23  Yes Malvin Johns, MD  colchicine 0.6 MG tablet Take 0.6 mg by mouth 2 (two) times daily as needed. 04/06/22   [provider]  diazepam (VALIUM) 5 MG tablet Take 5 mg by mouth at bedtime as needed (sleep).    [provider]  losartan (COZAAR) 50 MG tablet Take 1 tablet (50 mg total) by mouth daily. 06/28/17 06/25/22  Richardson Dopp T, PA-C  metoprolol succinate (TOPROL-XL) 50 MG 24 hr tablet Take 1 tablet (50 mg total) by mouth daily. 12/14/22   Camnitz, Ocie Doyne, MD  rosuvastatin (CRESTOR) 5 MG tablet Take 5 mg by mouth every Monday, Wednesday, and Friday.     [provider]  TART CHERRY PO Take 8 oz by mouth daily.    [provider]  XARELTO 20 MG TABS tablet TAKE ONE (1) TABLET BY MOUTH EVERY DAY WITH SUPPER 06/20/22   Camnitz, Ocie Doyne, MD       Allergies    Patient has no known allergies.    Review of Systems   Review of Systems  Constitutional:  Negative for fever.  Gastrointestinal:  Negative for nausea and vomiting.  Musculoskeletal:  Negative for arthralgias, back pain, joint swelling and neck pain.  Skin:  Positive for wound.  Neurological:  Negative for weakness, numbness and headaches.    Physical Exam Updated Vital Signs BP (!) 199/101   Pulse 94   Temp 98.1 F (36.7 C)   Resp 18   Ht 5\' 10"  (1.778 m)   Wt 83.5 kg   SpO2 99%   BMI 26.41 kg/m  Physical Exam Constitutional:      Appearance: He is well-developed.  HENT:     Head: Normocephalic and atraumatic.  Cardiovascular:     Rate and Rhythm: Normal rate.  Pulmonary:     Effort: Pulmonary effort is normal.  Musculoskeletal:        General: Tenderness present.     Cervical back: Normal range of motion and neck supple.     Comments: There is erythema to the dorsum of the left forearm with a  4 cm fluctuant area on an overlying scab.  There is a large area  of ecchymosis to the posterior aspect of the left arm and extending up toward the elbow.  No bony tenderness is noted to the arm.  Distal pulses are intact.  He has full range of motion of the hand without significant pain to the forearm.  Skin:    General: Skin is warm and dry.  Neurological:     Mental Status: He is alert and oriented to person, place, and time.     ED Results / Procedures / Treatments   Labs (all labs ordered are listed, but only abnormal results are displayed) Labs Reviewed  BASIC METABOLIC PANEL - Abnormal; Notable for the following components:      Result Value   Glucose, Bld 127 (*)    Creatinine, Ser 1.28 (*)    GFR, Estimated 54 (*)    All other components within normal limits  CBC WITH DIFFERENTIAL/PLATELET - Abnormal; Notable for the following components:   Abs Immature Granulocytes 0.15 (*)    All other components within normal limits     EKG None  Radiology CT Head Wo Contrast  Result Date: 01/24/2023 CLINICAL DATA:  Recent trip and fall with headaches and neck pain, initial encounter EXAM: CT HEAD WITHOUT CONTRAST CT MAXILLOFACIAL WITHOUT CONTRAST CT CERVICAL SPINE WITHOUT CONTRAST TECHNIQUE: Multidetector CT imaging of the head, cervical spine, and maxillofacial structures were performed using the standard protocol without intravenous contrast. Multiplanar CT image reconstructions of the cervical spine and maxillofacial structures were also generated. RADIATION DOSE REDUCTION: This exam was performed according to the departmental dose-optimization program which includes automated exposure control, adjustment of the mA and/or kV according to patient size and/or use of iterative reconstruction technique. COMPARISON:  None Available. FINDINGS: CT HEAD FINDINGS Brain: No evidence of acute infarction, hemorrhage, hydrocephalus, extra-axial collection or mass lesion/mass effect. Chronic atrophic and ischemic changes are noted. Vascular: No hyperdense vessel or unexpected calcification. Skull: Normal. Negative for fracture or focal lesion. Other: None. CT MAXILLOFACIAL FINDINGS Osseous: No fracture or mandibular dislocation. No destructive process. Orbits: Orbits and their contents are within normal limits. Sinuses: Paranasal sinuses are unremarkable. Soft tissues: Surrounding soft tissue structures show no focal hematoma. CT CERVICAL SPINE FINDINGS Alignment: Within normal limits. Skull base and vertebrae: Seven cervical segments are well visualized. Vertebral body height is well maintained. Multilevel disc space narrowing is noted from C3-C7. Facet hypertrophic changes are noted as well. No acute fracture or acute facet abnormality is noted. The odontoid is within normal limits. Soft tissues and spinal canal: Surrounding soft tissue structures are within normal limits. No focal hematoma is seen. Upper chest: Visualized lung apices are  unremarkable. Other: None IMPRESSION: CT of the head: Chronic atrophic and ischemic changes. CT of the maxillofacial bones: No acute bony abnormality is noted. CT of the cervical spine: Multilevel degenerative change without acute abnormality. Electronically Signed   By: Inez Catalina M.D.   On: 01/24/2023 23:28   CT Cervical Spine Wo Contrast  Result Date: 01/24/2023 CLINICAL DATA:  Recent trip and fall with headaches and neck pain, initial encounter EXAM: CT HEAD WITHOUT CONTRAST CT MAXILLOFACIAL WITHOUT CONTRAST CT CERVICAL SPINE WITHOUT CONTRAST TECHNIQUE: Multidetector CT imaging of the head, cervical spine, and maxillofacial structures were performed using the standard protocol without intravenous contrast. Multiplanar CT image reconstructions of the cervical spine and maxillofacial structures were also generated. RADIATION DOSE REDUCTION: This exam was performed according to the departmental dose-optimization program which includes automated exposure control, adjustment of the mA and/or kV according to patient size  and/or use of iterative reconstruction technique. COMPARISON:  None Available. FINDINGS: CT HEAD FINDINGS Brain: No evidence of acute infarction, hemorrhage, hydrocephalus, extra-axial collection or mass lesion/mass effect. Chronic atrophic and ischemic changes are noted. Vascular: No hyperdense vessel or unexpected calcification. Skull: Normal. Negative for fracture or focal lesion. Other: None. CT MAXILLOFACIAL FINDINGS Osseous: No fracture or mandibular dislocation. No destructive process. Orbits: Orbits and their contents are within normal limits. Sinuses: Paranasal sinuses are unremarkable. Soft tissues: Surrounding soft tissue structures show no focal hematoma. CT CERVICAL SPINE FINDINGS Alignment: Within normal limits. Skull base and vertebrae: Seven cervical segments are well visualized. Vertebral body height is well maintained. Multilevel disc space narrowing is noted from C3-C7. Facet  hypertrophic changes are noted as well. No acute fracture or acute facet abnormality is noted. The odontoid is within normal limits. Soft tissues and spinal canal: Surrounding soft tissue structures are within normal limits. No focal hematoma is seen. Upper chest: Visualized lung apices are unremarkable. Other: None IMPRESSION: CT of the head: Chronic atrophic and ischemic changes. CT of the maxillofacial bones: No acute bony abnormality is noted. CT of the cervical spine: Multilevel degenerative change without acute abnormality. Electronically Signed   By: Inez Catalina M.D.   On: 01/24/2023 23:28   CT Maxillofacial Wo Contrast  Result Date: 01/24/2023 CLINICAL DATA:  Recent trip and fall with headaches and neck pain, initial encounter EXAM: CT HEAD WITHOUT CONTRAST CT MAXILLOFACIAL WITHOUT CONTRAST CT CERVICAL SPINE WITHOUT CONTRAST TECHNIQUE: Multidetector CT imaging of the head, cervical spine, and maxillofacial structures were performed using the standard protocol without intravenous contrast. Multiplanar CT image reconstructions of the cervical spine and maxillofacial structures were also generated. RADIATION DOSE REDUCTION: This exam was performed according to the departmental dose-optimization program which includes automated exposure control, adjustment of the mA and/or kV according to patient size and/or use of iterative reconstruction technique. COMPARISON:  None Available. FINDINGS: CT HEAD FINDINGS Brain: No evidence of acute infarction, hemorrhage, hydrocephalus, extra-axial collection or mass lesion/mass effect. Chronic atrophic and ischemic changes are noted. Vascular: No hyperdense vessel or unexpected calcification. Skull: Normal. Negative for fracture or focal lesion. Other: None. CT MAXILLOFACIAL FINDINGS Osseous: No fracture or mandibular dislocation. No destructive process. Orbits: Orbits and their contents are within normal limits. Sinuses: Paranasal sinuses are unremarkable. Soft tissues:  Surrounding soft tissue structures show no focal hematoma. CT CERVICAL SPINE FINDINGS Alignment: Within normal limits. Skull base and vertebrae: Seven cervical segments are well visualized. Vertebral body height is well maintained. Multilevel disc space narrowing is noted from C3-C7. Facet hypertrophic changes are noted as well. No acute fracture or acute facet abnormality is noted. The odontoid is within normal limits. Soft tissues and spinal canal: Surrounding soft tissue structures are within normal limits. No focal hematoma is seen. Upper chest: Visualized lung apices are unremarkable. Other: None IMPRESSION: CT of the head: Chronic atrophic and ischemic changes. CT of the maxillofacial bones: No acute bony abnormality is noted. CT of the cervical spine: Multilevel degenerative change without acute abnormality. Electronically Signed   By: Inez Catalina M.D.   On: 01/24/2023 23:28   CT FOREARM LEFT WO CONTRAST  Result Date: 01/24/2023 CLINICAL DATA:  Fall 1 week ago with forearm pain and swelling EXAM: CT OF THE LEFT FOREARM WITHOUT CONTRAST TECHNIQUE: Multidetector CT imaging was performed according to the standard protocol. Multiplanar CT image reconstructions were also generated. RADIATION DOSE REDUCTION: This exam was performed according to the departmental dose-optimization program which includes automated exposure control,  adjustment of the mA and/or kV according to patient size and/or use of iterative reconstruction technique. COMPARISON:  Plain film from earlier in the same day as well as 01/17/2023 FINDINGS: Bones/Joint/Cartilage Distal humerus appears within normal limits. No elbow joint effusion is noted. Radius and ulna appear within normal limits. No erosive changes are seen. No acute fracture or dislocation is seen. Some degenerative changes at the first carpometacarpal articulation are seen. Ligaments Suboptimally assessed by CT. Muscles and Tendons Surrounding musculature appears within normal  limits. Soft tissues Edema is noted in the distal upper arm as well as in the proximal forearm. No focal hematoma is seen. IMPRESSION: No acute fracture or dislocation is noted. Soft tissue edema is noted consistent with the recent injury. No hematoma or abscess is seen. Electronically Signed   By: Inez Catalina M.D.   On: 01/24/2023 21:50   DG Forearm Left  Result Date: 01/24/2023 CLINICAL DATA:  Left forearm infection.  Evaluate for osteomyelitis. EXAM: LEFT FOREARM - 2 VIEW COMPARISON:  Left forearm series 01/17/2023 FINDINGS: There is no evidence of fracture or other focal bone lesions. There is persistent soft tissue edema in the lateral aspect of the proximal to mid forearm and in the distal dorsal aspect, mildly improved in the interval. There is no appreciable soft tissue gas. There is no radiopaque foreign body. There are faint calcifications in the distal radial artery. IMPRESSION: 1. No evidence of fracture or acute osteomyelitis. 2. Mildly improved soft tissue edema since 01/17/2023. Electronically Signed   By: Telford Nab M.D.   On: 01/24/2023 20:35    Procedures Procedures    Medications Ordered in ED Medications  lidocaine (LMX) 4 % cream (1 Application Topical Given 01/24/23 1946)  doxycycline (VIBRA-TABS) tablet 100 mg (100 mg Oral Given 01/24/23 2232)    ED Course/ Medical Decision Making/ A&P                             Medical Decision Making Amount and/or Complexity of Data Reviewed Labs: ordered. Radiology: ordered.  Risk OTC drugs. Prescription drug management.   Patient is a 89 year old male who presents with redness and swelling of his left forearm.  It seems to be localized around an area of swelling/fluctuance.  He had labs checked which are nonconcerning.  His creatinine is mildly elevated but similar to values of 1.26 which was 4 years ago.  He is afebrile.  His white count is normal.  Initially had an x-ray which does not show any bony involvement of  infection/osteomyelitis.  This was interpreted by me and confirmed by the radiologist.  The family was concerned about getting a hand surgery consult which is what they had been told needed to be done.  I did consult with Dr. Tempie Donning with hand surgery.  He recommends getting a CT scan of the arm and he will call back once he is out of surgery.  CT scan was performed which does not show any fluid pocket.  No underlying hematoma or abscess.  Seems to be some localized swelling, likely from prior hematoma that is breaking down.  There potentially can be some overlying infection but it seems to be localized to the area.  He does not have any spreading of the redness around his arm.  The family actually got in contact with orthopedist, Dr. Fredonia Highland who is also seeing pictures of the wound and consulted with Dr. Tempie Donning.  Given the CT findings,  it was decided that no opening of the wound would be appropriate.  Will go ahead and switch the patient's antibiotic to doxycycline.  Dr. Percell Miller is going to try to arrange follow-up with plastic surgery for the family within the next couple of days.  I have also given them information about following up with Dr. Tempie Donning if they are not able to get into see plastic surgery.  The patient and the daughter are both aware to watch for any worsening symptoms.  If he has any evidence of spreading cellulitis, will need to return to the ED but at this point I do not see an indication for admission.  Will start doxycycline and have follow-up as discussed previously.   As patient was ambulating out of the ED, he tripped on his cane and fell forward, striking his head on the ground.  He sustained a superficial laceration over the bridge of the nose with some mild underlying swelling.  This was repaired with Steri-Strips.  He also had an abrasion to his forehead.  Given that he is on anticoagulants, he had a CT scan of his head, facial bones and cervical spine.  These were negative for  acute abnormalities.  His tetanus shot is up-to-date.  He was discharged home with family.  Final Clinical Impression(s) / ED Diagnoses Final diagnoses:  Cellulitis, unspecified cellulitis site  Facial laceration, initial encounter  Injury of head, initial encounter    Rx / DC Orders ED Discharge Orders          Ordered    doxycycline (VIBRAMYCIN) 100 MG capsule  2 times daily        01/24/23 2225              Malvin Johns, MD 01/24/23 2236    Malvin Johns, MD 01/24/23 5015    Malvin Johns, MD 01/24/23 8682    Malvin Johns, MD 01/25/23 0006

## 2023-01-24 NOTE — Discharge Instructions (Addendum)
Change the dressing once daily.  Apply antibiotic ointment and a dry nonstick dressing.  Keep the arm elevated is much as possible.  Take the antibiotics as discussed.  Follow-up with either the plastic surgeon or the hand surgeon listed above within the next few days for recheck.  Return to the emergency room if there are any worsening symptoms or increased redness to the area.

## 2023-01-24 NOTE — ED Notes (Addendum)
While walking out of department with cane pt fell with daughter at side. Reports, "I was just in a hurry to get out of here and tripped over my legs." ER staff with pt and witnessed fall. Writer to pt side, and was sitting in front of room 16 with laceration noted to bridge of nose with bleeding. Denies LOC. Respirations are equal and nonlabored. Skin warm and dry. Pt is alert and oriented x4. Assisted to wheelchair and wheeled back to room 4. Bleeding controlled at this time. V/S obtained. Denies needs at this time. Daughter remains at bedside.

## 2023-01-24 NOTE — ED Notes (Addendum)
Pt had a trip fall while using personal cane on the way out to the lobby for discharge. Daughter present, also witnessed by staff. No LOC. Bleeding present to nose, controlled with gauze. Pt on Xarelto. Dr. Tamera Punt notified

## 2023-01-25 ENCOUNTER — Ambulatory Visit: Payer: Medicare Other | Admitting: Surgical

## 2023-01-25 DIAGNOSIS — S41102D Unspecified open wound of left upper arm, subsequent encounter: Secondary | ICD-10-CM | POA: Diagnosis not present

## 2023-01-25 DIAGNOSIS — S40022D Contusion of left upper arm, subsequent encounter: Secondary | ICD-10-CM

## 2023-01-25 DIAGNOSIS — W19XXXA Unspecified fall, initial encounter: Secondary | ICD-10-CM

## 2023-01-25 NOTE — Progress Notes (Unsigned)
Referring Provider Javier Glazier, MD [place],  [place] [phone]   CC:  Chief Complaint  Patient presents with   Advice Only      Thomas Diaz is an 89 y.o. male.  HPI: Patient is an 89 year old male who presents for evaluation of his left forearm.  Per EMR review he was seen in the emergency room yesterday due to swelling and redness of his left forearm.  He reported to ED provider that 2 weeks ago he had a fall, tearing the skin over his left forearm and has been having it checked periodically.  He has been on Keflex.  It recently got more swollen, he was evaluated by the wound care clinic and per family was told that they cannot take care of it and he needs to see a hand specialist.  Of note he also had a fall yesterday while in the emergency room, subsequently had a head and neck CT scan and was discharged from the ED.  Patient presents with his daughter, he reports he is feeling well.  He has normal function of his hand, they report that the swelling of his left hand has significantly improved.  He continues to have swelling of his left distal forearm.  He is on Xarelto for atrial fibrillation.  Review of Systems Skin: Positive hematoma, positive wound/abrasion  Physical Exam    01/25/2023   12:15 AM 01/24/2023   10:44 PM 01/24/2023   10:02 PM  Vitals with BMI  Systolic [id number]  Diastolic 98 [id number] 98  Pulse 80 94 86    General:  No acute distress,  Alert and oriented, Non-Toxic, Normal speech and affect Left arm: Left mid forearm swelling noted with superficial abrasion.  There is surrounding redness, but no cellulitic changes at this time.  No swelling of digits noted.  He has normal flexion and extension of his wrist.  Palpable radial pulses noted.  The wound is 5 x 6 x 1cm.    Assessment/Plan 89 year old male here for evaluation of his left forearm wound and hematoma.  The area was drained in the office today by myself and Dr. Marla Roe.  Patient  tolerated the procedure well.  Procedure note documented below.  The area does not appear infected, we discussed recommended dressing changes daily.  Dressing changes recommended: Soak the area daily with Vashe soaked gauze for 10 minutes, then dry the area, apply Xeroform and Vaseline, 4 x 4 gauze, Kerlix, Ace wrap.  Wrapped the area from distal hand to proximal forearm.  Recommend keeping the area elevated.   Preoperative Dx: Left arm hematoma and wound  Postoperative Dx: Same  Procedure: Incision and drainage with evacuation of left arm hematoma  Provider: Roetta Sessions, PA-C  Anesthesia: Lidocaine 1% with 1:100,000 epinepherine  Indication for Procedure: Left arm hematoma  Description of Procedure: Risks and complications were explained to the patient and his daughter.  Verbal consent was confirmed.  Time out was called and all information was confirmed to be correct.  The area was prepped with betadine and drapped. 3cc of Lidocaine 1% with epinepherine was injected in the subcutaneous area.  After waiting several minutes for the lidocaine to take affect a #15 blade was used to create a 1 cm opening at the distal portion of the left forearm wound.  The left arm hematoma was then evacuated.  The hematoma cavity was irrigated with copious amounts of sterile saline. The patient tolerated the procedure well.  Recommend  following up in 1 week.  There were no complications.     Thomas Diaz 01/25/2023, 9:23 AM

## 2023-01-28 ENCOUNTER — Telehealth: Payer: Self-pay

## 2023-01-28 NOTE — Telephone Encounter (Signed)
Received correspondence from [facility] that they provided service for the patient; no further action required. Forwarded both wound supply order form and [facility] correspondence to front desk for batch scanning.

## 2023-01-28 NOTE — Telephone Encounter (Signed)
Faxed wound supply order to [facility] with demographics, insurance card, and most recent ov note. Received success confirmation.

## 2023-02-01 ENCOUNTER — Ambulatory Visit (INDEPENDENT_AMBULATORY_CARE_PROVIDER_SITE_OTHER): Payer: Medicare Other | Admitting: Surgical

## 2023-02-01 DIAGNOSIS — S40022D Contusion of left upper arm, subsequent encounter: Secondary | ICD-10-CM

## 2023-02-01 DIAGNOSIS — S41102D Unspecified open wound of left upper arm, subsequent encounter: Secondary | ICD-10-CM

## 2023-02-01 NOTE — Progress Notes (Signed)
   Referring Provider Linus Mako, NP No address on file   CC:  Chief Complaint  Patient presents with   Follow-up      Thomas Diaz is an 89 y.o. male.  HPI: 89 year old male here with his daughter for evaluation of his left arm wound, he had a hematoma in this area which we drained at his last appointment.  They have been doing the recommended dressing changes and feel as if things are going well.  He reports he is not having any issues, he has bothered by the dressing/Ace wrap and asks when he can be done using that.  He reports he has normal function of his left upper extremity.  Review of Systems General: No fevers or chills  Physical Exam    01/25/2023   12:15 AM 01/24/2023   10:44 PM 01/24/2023   10:02 PM  Vitals with BMI  Systolic [id number]  Diastolic 98 [id number] 98  Pulse 80 94 86    General:  No acute distress,  Alert and oriented, Non-Toxic, Normal speech and affect Left upper extremity: Left arm wound is healing well, there is significant amount of new epithelialization noted.  He does have 2 deeper wounds noted, 1 where the stab incision was made to drain the hematoma which has some mild fibrinous exudate noted and another one just lateral and proximal to this.  There is no surrounding erythema or cellulitic changes.  They appear to be healing well.  The wounds are approximately 0.3 x 0.1 cm each.  Normal range of motion of his wrist, normal cascade of all 5 digits.  Palpable radial pulse noted.    Assessment/Plan 89 year old male with left forearm wound after drainage of hematoma.  He is doing well, recommend continuing with dressing changes per our last appointment.  They have received supplies via [facility].  All of their questions were answered to their content.  We will plan to see him back in approximately 1 to 2 weeks for reevaluation.  Pictures were obtained of the patient and placed in the chart with the patient's or guardian's permission.  There is no  signs of infection on exam.   Carola Rhine Prerna Harold 02/01/2023, 10:24 AM

## 2023-02-12 ENCOUNTER — Ambulatory Visit: Payer: Medicare Other | Admitting: Surgical

## 2023-02-12 DIAGNOSIS — S41102D Unspecified open wound of left upper arm, subsequent encounter: Secondary | ICD-10-CM

## 2023-02-12 NOTE — Progress Notes (Signed)
   Referring Provider Linus Mako, NP No address on file   CC:  Chief Complaint  Patient presents with   Follow-up      Thomas Diaz is an 89 y.o. male.  HPI: 89 year old male here for follow-up on his left arm wound that he developed after a hematoma.  He is here with his daughter.  They have noticed significant improvement in the wound bed, it is nearly completely healed.  He reports he is not having any issues with range of motion or strength in his hand/wrist.  Review of Systems General: No fevers or chills  Physical Exam    01/25/2023   12:15 AM 01/24/2023   10:44 PM 01/24/2023   10:02 PM  Vitals with BMI  Systolic [id number]  Diastolic 98 [id number] 98  Pulse 80 94 86    General:  No acute distress,  Alert and oriented, Non-Toxic, Normal speech and affect Left upper extremity: Left arm wound is healing well, there is what appears to be complete epithelialization of the entire wound bed.  He does have some scabbing along the proximal lateral aspect.  There is no surrounding erythema or cellulitic changes.  There is no tenderness to palpation.  He has normal cascade of all 5 digits, normal range of motion of his wrist and all 5 digits.  Palpable radial pulses noted.    Assessment/Plan Recommend Vaseline and Mepilex border dressing over the left arm wound for 1 more week as the scabbing continues to be fall off.  He can wash the area while showering to soften the scab.  I suspect that majority of the skin under the scab has completely epithelialized.  We discussed following up as needed, calling with questions or concerns.  There is no signs infection or concern on exam.  Pictures were obtained of the patient and placed in the chart with the patient's or guardian's permission.   Thomas Diaz 02/12/2023, 9:27 AM

## 2023-08-22 ENCOUNTER — Other Ambulatory Visit: Payer: Self-pay | Admitting: Cardiology

## 2023-08-22 DIAGNOSIS — I480 Paroxysmal atrial fibrillation: Secondary | ICD-10-CM

## 2023-08-22 NOTE — Telephone Encounter (Signed)
Prescription refill request for Xarelto received.  Indication: AF Last office visit: 06/25/22  Carleene Mains MD Weight: 83.5kg Age: 89 Scr: 1.03 on 03/19/23 CrCl: 57.42  Based on above findings Xarelto 20mg  daily is the appropriate dose. Pt is due to see Dr Elberta Fortis,  Message sent to schedulers, Refill approved x 1.

## 2023-10-03 ENCOUNTER — Other Ambulatory Visit: Payer: Self-pay | Admitting: Cardiology

## 2023-10-03 ENCOUNTER — Telehealth: Payer: Self-pay | Admitting: Cardiology

## 2023-10-03 DIAGNOSIS — I480 Paroxysmal atrial fibrillation: Secondary | ICD-10-CM

## 2023-10-03 NOTE — Telephone Encounter (Signed)
Please see Xarelto refill from today. Will not resend, this is aduplicate.

## 2023-10-03 NOTE — Telephone Encounter (Signed)
Prescription refill request for Xarelto received.  Indication: Afib  Last office visit: 06/25/22 ([place])  Weight: 83.5kg Age: 89 Scr: 1.04 (09/25/23)  CrCl: 56.7ml/min  Office visit overdue. Called and spoke with pt, made him aware of overdue office visit. Transferred pt to scheduling.

## 2023-10-03 NOTE — Telephone Encounter (Signed)
  Xarelto 20mg  refill request received. Pt is 89 years old, weight- kg, Crea-1.04 on 09/25/23 via [facility], last seen by Dr Elberta Fortis on 06/25/22 and Pt has an appt scheduled on 01/27/23 with Dr. Elberta Fortis, Diagnosis-Afib, CrCl-56.87 mL/min; Dose is appropriate based on dosing criteria. Will send in refill to requested pharmacy.

## 2023-10-03 NOTE — Telephone Encounter (Signed)
*  STAT* If patient is at the pharmacy, call can be transferred to refill team.   1. Which medications need to be refilled? (please list name of each medication and dose if known)  rivaroxaban (XARELTO) 20 MG TABS tablet   2. Which pharmacy/location (including street and city if local pharmacy) is medication to be sent to?  [facility] [place],  - [address]    3. Do they need a 30 day or 90 day supply? 30  Has appt scheduled for 03/10

## 2023-12-09 ENCOUNTER — Telehealth: Payer: Self-pay | Admitting: Cardiology

## 2023-12-09 DIAGNOSIS — I480 Paroxysmal atrial fibrillation: Secondary | ICD-10-CM

## 2023-12-09 MED ORDER — RIVAROXABAN 20 MG PO TABS: 20.0000 mg | ORAL_TABLET | Freq: Every day | ORAL | 2 refills | Status: AC

## 2023-12-09 NOTE — Telephone Encounter (Signed)
*  STAT* If patient is at the pharmacy, call can be transferred to refill team.   1. Which medications need to be refilled? (please list name of each medication and dose if known)   rivaroxaban (XARELTO) 20 MG TABS tablet    2. Which pharmacy/location (including street and city if local pharmacy) is medication to be sent to? FAX to [facility] [phone]  3. Do they need a 30 day or 90 day supply? 90 day    Pt is out of medication

## 2023-12-09 NOTE — Telephone Encounter (Signed)
Called Thomas Diaz and aware we can send/fax any Rx to [place].  Aware Dr. Elberta Fortis is agreeable to printing a Rx for him and he can take it wherever he wants to have filled.  Thomas Diaz will stop by the [facility] office tomorrow.to pick up signed Rx. He appreciates the help with this.   Reminded to keep his follow up with Dr. Elberta Fortis on 3/10

## 2023-12-09 NOTE — Telephone Encounter (Signed)
Follow Up:     Patient called back with the phone number for the pharmacy. It is [phone]. He wants to call them after you fax them and make sure they receive the fax.

## 2023-12-24 ENCOUNTER — Other Ambulatory Visit: Payer: Self-pay | Admitting: Cardiology

## 2024-01-27 ENCOUNTER — Encounter: Payer: Self-pay | Admitting: Cardiology

## 2024-01-27 ENCOUNTER — Ambulatory Visit: Payer: Medicare Other | Attending: Cardiology | Admitting: Cardiology

## 2024-01-27 VITALS — BP 130/70 | HR 69 | Ht 70.0 in | Wt 184.1 lb

## 2024-01-27 DIAGNOSIS — I10 Essential (primary) hypertension: Secondary | ICD-10-CM

## 2024-01-27 DIAGNOSIS — I4821 Permanent atrial fibrillation: Secondary | ICD-10-CM

## 2024-01-27 DIAGNOSIS — D6869 Other thrombophilia: Secondary | ICD-10-CM

## 2024-01-27 DIAGNOSIS — I2510 Atherosclerotic heart disease of native coronary artery without angina pectoris: Secondary | ICD-10-CM | POA: Diagnosis not present

## 2024-01-27 DIAGNOSIS — I350 Nonrheumatic aortic (valve) stenosis: Secondary | ICD-10-CM

## 2024-01-27 NOTE — Progress Notes (Signed)
  Electrophysiology Office Note:   Date:  01/27/2024  ID:  Thomas Diaz, DOB February 04, 1932, MRN [id number]  Primary Cardiologist: Thomas Hacking, MD Primary Heart Failure: None Electrophysiologist: Thomas Grasmick [name], MD      History of Present Illness:   Thomas Diaz is a 89 y.o. male with h/o aortic stenosis post TAVR, coronary artery disease post CABG x 5, hypertension, atrial fibrillation seen today for routine electrophysiology followup.   Since last being seen in our clinic the patient reports doing well.  He has no chest pain or shortness of breath.  He is able to hold these without restriction.  He has no acute complaints at this time.  In review of his chart, he has not seen general cardiology in many years.  he denies chest pain, palpitations, dyspnea, PND, orthopnea, nausea, vomiting, dizziness, syncope, edema, weight gain, or early satiety.   Review of systems complete and found to be negative unless listed in HPI.   EP Information / Studies Reviewed:    EKG is ordered today. Personal review as below.  EKG Interpretation Date/Time:  Monday January 27 2024 13:48:51 EDT Ventricular Rate:  69 PR Interval:    QRS Duration:  106 QT Interval:  412 QTC Calculation: 441 R Axis:   79  Text Interpretation: Atrial fibrillation Incomplete right bundle branch block Nonspecific ST and T wave abnormality When compared with ECG of 10-Dec-2018 13:41, Atrial fibrillation has replaced Sinus rhythm Confirmed by Thomas Diaz ([id number]) on 01/27/2024 1:50:34 PM     Risk Assessment/Calculations:    CHA2DS2-VASc Score = 4   This indicates a 4.8% annual risk of stroke. The patient's score is based upon: CHF History: 0 HTN History: 1 Diabetes History: 0 Stroke History: 0 Vascular Disease History: 1 Age Score: 2 Gender Score: 0             Physical Exam:   VS:  BP 130/70   Pulse 69   Ht 5\' 10"  (1.778 m)   Wt 184 lb 1.9 oz (83.5 kg)   SpO2 98%   BMI 26.42 kg/m    Wt Readings  from Last 3 Encounters:  01/27/24 184 lb 1.9 oz (83.5 kg)  01/24/23 184 lb 1.4 oz (83.5 kg)  06/25/22 184 lb (83.5 kg)     GEN: Well nourished, well developed in no acute distress NECK: No JVD; No carotid bruits CARDIAC: Irregularly irregular rate and rhythm, no murmurs, rubs, gallops RESPIRATORY:  Clear to auscultation without rales, wheezing or rhonchi  ABDOMEN: Soft, non-tender, non-distended EXTREMITIES:  No edema; No deformity   ASSESSMENT AND PLAN:    1.  Permanent atrial fibrillation: Well rate controlled.  Currently on Toprol-XL 50 mg daily.  2.  Secondary hypercoagulable state: Currently on Xarelto 20 mg daily for atrial fibrillation  3.  Coronary artery disease: Post CABG.  No current ischemic symptoms.  4.  Severe aortic stenosis: Post TAVR.  No murmur on exam.  No changes.  5.  Hypertension: Currently well-controlled  Thomas Diaz arrange for follow-up with general cardiology.  Follow up with Dr. Elberta Diaz  PRN   Signed, Thomas [place] [name], MD

## 2024-01-27 NOTE — Patient Instructions (Signed)
 Medication Instructions:  Your physician recommends that you continue on your current medications as directed. Please refer to the Current Medication list given to you today.  *If you need a refill on your cardiac medications before your next appointment, please call your pharmacy*   Lab Work: None ordered  If you have any lab test that is abnormal or we need to change your treatment, we will call you to review the results.   Testing/Procedures: None ordered   Follow-Up: At [facility], you and your health needs are our priority.  As part of our continuing mission to provide you with exceptional heart care, we have created designated Provider Care Teams.  These Care Teams include your primary Cardiologist (physician) and Advanced Practice Providers (APPs -  Physician Assistants and Nurse Practitioners) who all work together to provide you with the care you need, when you need it.  Your next appointment:   Next available     The format for your next appointment:   In Person  Provider:   Norman Herrlich, MD    Your physician recommends that you schedule a follow-up appointment as needed with Dr. Elberta Fortis.   Thank you for choosing [facility]!!   [phone]

## 2024-03-19 ENCOUNTER — Ambulatory Visit

## 2024-03-19 ENCOUNTER — Encounter: Payer: Self-pay | Admitting: *Deleted

## 2024-03-19 VITALS — BP 130/80 | HR 75 | Ht 70.0 in | Wt 188.0 lb

## 2024-03-19 DIAGNOSIS — I10 Essential (primary) hypertension: Secondary | ICD-10-CM

## 2024-03-19 DIAGNOSIS — I4821 Permanent atrial fibrillation: Secondary | ICD-10-CM

## 2024-03-19 DIAGNOSIS — I2510 Atherosclerotic heart disease of native coronary artery without angina pectoris: Secondary | ICD-10-CM | POA: Diagnosis not present

## 2024-03-19 DIAGNOSIS — R932 Abnormal findings on diagnostic imaging of liver and biliary tract: Secondary | ICD-10-CM

## 2024-03-19 DIAGNOSIS — E782 Mixed hyperlipidemia: Secondary | ICD-10-CM

## 2024-03-19 DIAGNOSIS — Z952 Presence of prosthetic heart valve: Secondary | ICD-10-CM

## 2024-03-19 NOTE — Progress Notes (Signed)
 Cardiology Consultation:    Date:  03/19/2024   ID:  [name], DOB [birth year], MRN [id number]  PCP:  Cherrie Cornwall, NP  Cardiologist:  Daymon Evans Graeme Menees, MD   Referring MD: Bert Britain I, NP   Chief Complaint  Patient presents with   Establish Care     ASSESSMENT AND PLAN:   Mr. Wilmott 89 year old with history of CAD s/p CABG in 2009, severe aortic stenosis s/p 26 mm Edwards SAPIEN valve by TAVR in July 2018, permanent atrial fibrillation on rate control strategy remains on anticoagulation with Xarelto , hypertension, hyperlipidemia, here to establish care with general cardiology.  Doing well at baseline functional status and no cardiac symptoms.   Problem List Items Addressed This Visit     CAD (coronary artery disease), native coronary artery   Doing well with functional status at baseline as reviewed below without any significant change over the past couple years..  No cardiac symptoms at this time. S/p CABG 2009 Last cardiac catheterization June 2018 with patent LIMA-LAD, SVG-RCA, SVG-OM, SVG-diagonal.  Remains on anticoagulation with Xarelto  hence not on aspirin . Remains on Crestor  5 mg 3 times a week.       S/P TAVR (transcatheter aortic valve replacement) - Primary   No follow-up echocardiogram since 2020.  Will schedule him for 1 to assess aortic valve prosthetic function. Continue with infective endocarditis prophylaxis as needed for any procedures such as dental or surgical.      Relevant Orders   ECHOCARDIOGRAM COMPLETE   Essential hypertension   Well-controlled on current medications with losartan  50 mg once daily and metoprolol  succinate 50 mg once daily.      Hyperlipidemia   Lipid panel from 09/25/2023 total cholesterol 107, triglycerides 57, HDL 43, LDL 51, Controlled. Liver function panel from 10/01/2023 with total bilirubin elevated 3.4 Transaminases and alkaline phosphatase normal.  Continue rosuvastatin  5 mg 3 times a week.       Atrial fibrillation (HCC)   Permanent atrial fibrillation. Good rate control. Continue with anticoagulation Xarelto  20 mg once daily. No bleeding concerns at this time.      Abnormal ultrasound of liver, 5 mm echogenicity November 2024   He showed me the results from normal 2024 regarding ultrasound.  Total bilirubin mildly elevated as reviewed below from his November blood work. He is pending follow-up with his PCP currently in transition of care.  He mentions he has follow-up blood work pending and 3 months.      He wishes to hold off on repeat echocardiogram until his follow-up blood work and ultrasound liver is completed. This is reasonable.  Will follow-up with us  in the office tentatively in 6 months.   History of Present Illness:    Thomas Diaz is a 89 y.o. male who is being seen today for establishing care with general cardiology. PCP is Bert Britain I, NP. Was following up closely with Dr. Lawana Pray and electrophysiology and last visit was 01-27-2024  Has history of coronary artery disease s/p CABG in 2009, severe aortic stenosis s/p 26 mm Edwards SAPIEN 3 valve in July 2018, permanent atrial fibrillation on rate control strategy and anticoagulation with Xarelto , hypertension, hyperlipidemia.  Very pleasant gentleman, lives at an apartment assisted living facility at [facility].  Does walk regularly, manages his own medications.  Has a daughter who lives nearby and another daughter who lives in [place] .  Has been having no active cardiac symptoms.  Takes care changing position.  Denies any mechanical fall, syncopal  episodes.  Denies any palpitations, lightheadedness, dizziness.  Denies any chest pain or shortness of breath.  Denies any blood in urine or stools. No significant weight changes.  EKG from 01-27-2024 atrial fibrillation heart rate 69/min QRS duration 106 ms, QTc 441 ms.   Last echocardiogram to review is from February 2020 preserved LVEF 50 to 55%,  mildly dilated RV with mildly reduced RV function.  Moderately dilated left atrium and mildly dilated right atrium, mild mitral regurgitation, well-seated bioprosthetic aortic valve with mean gradient 12 mmHg.  Lipid panel from 09/25/2023 total cholesterol 107, triglycerides 57, HDL 43, LDL 51, Controlled. Liver function panel from 10/01/2023 with total bilirubin elevated 3.4 Transaminases and alkaline phosphatase normal.  He apparently had a small 5 mm echodensity noted in the liver on ultrasound abdomen November 2024 and was recommended follow-up ultrasound imaging which is currently pending as he is in transition between PCPs.  Past Medical History:  Diagnosis Date   Aortic stenosis 07/06/2013   Echocardiogram on 06/19/13 and showed mild to moderate aortic stenosis.  His ejection fraction is 55-60%. >> s/p TAVR 05/2017   BPH (benign prostatic hyperplasia) 05/30/2011   CAD (coronary artery disease), native coronary artery 03/07/2017   Cath 2009 with 60% LM and 95% RCA  CABG 6//19/09 with LIMA to LAD, SVG to RCA, SVG to int-OM and SVG to Dx Dr. 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   patient has never been told this   Gout    Heart murmur    had all his life   Hyperlipidemia    Hypertension    OA (osteoarthritis)    PAC (premature atrial contraction)    Persistent atrial fibrillation (HCC) 06/16/2013   CHA2DS2VASC score 4 Cardioversion 03/07/17 with reversion in one week Cardioversion 04/10/17 after loading with amiodarone  with sinus    S/P CABG x 5 05/12/2008   CABG x 5 - LIMA to LAD, SVG to Diag, Sequential SVG to ramus-OM1, SVG to RCA - Dr Nicanor Barge   S/P TAVR (transcatheter aortic valve replacement) 06/18/2017   26 mm Edwards Sapien 3 transcatheter heart valve placed via percutaneous right transfemoral approach     Past Surgical History:  Procedure Laterality Date   CARDIOVASCULAR STRESS TEST  11/03/04   EF 66%   CARDIOVERSION N/A 03/07/2017   Procedure:  CARDIOVERSION;  Surgeon: Dorsey Gault, MD;  Location: [facility] ENDOSCOPY;  Service: Cardiovascular;  Laterality: N/A;   CARDIOVERSION N/A 04/10/2017   Procedure: CARDIOVERSION;  Surgeon: Dorsey Gault, MD;  Location: [facility] ENDOSCOPY;  Service: Cardiovascular;  Laterality: N/A;   CARDIOVERSION N/A 12/10/2018   Procedure: CARDIOVERSION;  Surgeon: Jacqueline Matsu, MD;  Location: [facility] ENDOSCOPY;  Service: Cardiovascular;  Laterality: N/A;   COLONOSCOPY  2000   CORONARY ARTERY BYPASS GRAFT  04/2008   RIGHT/LEFT HEART CATH AND CORONARY/GRAFT ANGIOGRAPHY N/A 05/09/2017   Procedure: Right/Left Heart Cath and Coronary/Graft Angiography;  Surgeon: Millicent Ally, MD;  Location: [facility] INVASIVE CV LAB;  Service: Cardiovascular;  Laterality: N/A;   TEE WITHOUT CARDIOVERSION N/A 06/18/2017   Procedure: TRANSESOPHAGEAL ECHOCARDIOGRAM (TEE);  Surgeon: Arnoldo Lapping, MD;  Location: [facility] OR;  Service: Open Heart Surgery;  Laterality: N/A;   TONSILLECTOMY AND ADENOIDECTOMY     TRANSCATHETER AORTIC VALVE REPLACEMENT, TRANSFEMORAL N/A 06/18/2017   Procedure: TRANSCATHETER AORTIC VALVE REPLACEMENT, TRANSFEMORAL using an Edwards 26 mm Sapien 3 Aortic Valve;  Surgeon: Arnoldo Lapping, MD;  Location: [facility] OR;  Service: Open Heart Surgery;  Laterality: N/A;  Current Medications: Current Meds  Medication Sig   colchicine 0.6 MG tablet Take 0.6 mg by mouth 2 (two) times daily as needed.   diazepam  (VALIUM ) 5 MG tablet Take 5 mg by mouth at bedtime as needed (sleep).   doxycycline  (VIBRAMYCIN ) 100 MG capsule Take 1 capsule (100 mg total) by mouth 2 (two) times daily. One po bid x 7 days   famotidine  (PEPCID ) 40 MG tablet Take 40 mg by mouth daily.   losartan  (COZAAR ) 50 MG tablet Take 1 tablet (50 mg total) by mouth daily.   metoprolol  succinate (TOPROL -XL) 50 MG 24 hr tablet TAKE ONE (1) TABLET BY MOUTH EACH DAY WITH OR IMMEDIATELY FOLLOWING A MEAL   rivaroxaban  (XARELTO ) 20 MG TABS tablet Take 1 tablet (20 mg total) by mouth  daily with supper.   rosuvastatin  (CRESTOR ) 5 MG tablet Take 5 mg by mouth every Monday, Wednesday, and Friday.    TART CHERRY PO Take 8 oz by mouth daily.     Allergies:   Patient has no known allergies.   Social History   Socioeconomic History   Marital status: Married    Spouse name: Not on file   Number of children: Not on file   Years of education: Not on file   Highest education level: Not on file  Occupational History   Not on file  Tobacco Use   Smoking status: Never   Smokeless tobacco: Never  Vaping Use   Vaping status: Never Used  Substance and Sexual Activity   Alcohol use: No   Drug use: No   Sexual activity: Not on file  Other Topics Concern   Not on file  Social History Narrative   Not on file   Social Drivers of Health   Financial Resource Strain: Not on file  Food Insecurity: Not on file  Transportation Needs: Not on file  Physical Activity: Not on file  Stress: Not on file  Social Connections: Not on file     Family History: The patient's family history includes Heart attack in his brother and father. ROS:   Please see the history of present illness.    All 14 point review of systems negative except as described per history of present illness.  EKGs/Labs/Other Studies Reviewed:    The following studies were reviewed today:   EKG:       Recent Labs: No results found for requested labs within last 365 days.  Recent Lipid Panel    Component Value Date/Time   CHOL 159 11/03/2018 1612   TRIG 122 11/03/2018 1612   HDL 57 11/03/2018 1612   CHOLHDL 2.8 11/03/2018 1612   CHOLHDL 3 04/13/2015 0947   VLDL 14.6 04/13/2015 0947   LDLCALC 78 11/03/2018 1612    Physical Exam:    VS:  BP 130/80   Pulse 75   Ht 5\' 10"  (1.778 m)   Wt 188 lb (85.3 kg)   SpO2 95%   BMI 26.98 kg/m     Wt Readings from Last 3 Encounters:  03/19/24 188 lb (85.3 kg)  01/27/24 184 lb 1.9 oz (83.5 kg)  01/24/23 184 lb 1.4 oz (83.5 kg)     GENERAL:  Well  nourished, well developed in no acute distress NECK: No JVD; No carotid bruits CARDIAC: Irregularly irregular, S1 and S2 present, no significant murmur. CHEST:  Clear to auscultation without rales, wheezing or rhonchi  Extremities: No pitting pedal edema. Pulses bilaterally symmetric with radial 2+ and dorsalis pedis 2+ NEUROLOGIC:  Alert and oriented x 3  Medication Adjustments/Labs and Tests Ordered: Current medicines are reviewed at length with the patient today.  Concerns regarding medicines are outlined above.  Orders Placed This Encounter  Procedures   ECHOCARDIOGRAM COMPLETE   No orders of the defined types were placed in this encounter.   Signed, Lura Sallies, MD, MPH, [facility]. 03/19/2024 2:32 PM    [facility]

## 2024-03-19 NOTE — Assessment & Plan Note (Signed)
 Doing well with functional status at baseline as reviewed below without any significant change over the past couple years..  No cardiac symptoms at this time. S/p CABG 2009 Last cardiac catheterization June 2018 with patent LIMA-LAD, SVG-RCA, SVG-OM, SVG-diagonal.  Remains on anticoagulation with Xarelto  hence not on aspirin . Remains on Crestor  5 mg 3 times a week.

## 2024-03-19 NOTE — Assessment & Plan Note (Signed)
 He showed me the results from normal 2024 regarding ultrasound.  Total bilirubin mildly elevated as reviewed below from his November blood work. He is pending follow-up with his PCP currently in transition of care.  He mentions he has follow-up blood work pending and 3 months.

## 2024-03-19 NOTE — Assessment & Plan Note (Signed)
 Lipid panel from 09/25/2023 total cholesterol 107, triglycerides 57, HDL 43, LDL 51, Controlled. Liver function panel from 10/01/2023 with total bilirubin elevated 3.4 Transaminases and alkaline phosphatase normal.  Continue rosuvastatin  5 mg 3 times a week.

## 2024-03-19 NOTE — Assessment & Plan Note (Signed)
 No follow-up echocardiogram since 2020.  Will schedule him for 1 to assess aortic valve prosthetic function. Continue with infective endocarditis prophylaxis as needed for any procedures such as dental or surgical.

## 2024-03-19 NOTE — Assessment & Plan Note (Signed)
 Well-controlled on current medications with losartan  50 mg once daily and metoprolol  succinate 50 mg once daily.

## 2024-03-19 NOTE — Patient Instructions (Signed)
 Medication Instructions:  Your physician recommends that you continue on your current medications as directed. Please refer to the Current Medication list given to you today.  *If you need a refill on your cardiac medications before your next appointment, please call your pharmacy*   Lab Work: None Ordered If you have labs (blood work) drawn today and your tests are completely normal, you will receive your results only by: MyChart Message (if you have MyChart) OR A paper copy in the mail If you have any lab test that is abnormal or we need to change your treatment, we will call you to review the results.   Testing/Procedures: Echocardiogram An echocardiogram is a test that uses sound waves (ultrasound) to produce images of the heart. Images from an echocardiogram can provide important information about: Heart size and shape. The size and thickness and movement of your heart's walls. Heart muscle function and strength. Heart valve function or if you have stenosis. Stenosis is when the heart valves are too narrow. If blood is flowing backward through the heart valves (regurgitation). A tumor or infectious growth around the heart valves. Areas of heart muscle that are not working well because of poor blood flow or injury from a heart attack. Aneurysm detection. An aneurysm is a weak or damaged part of an artery wall. The wall bulges out from the normal force of blood pumping through the body. Tell a health care provider about: Any allergies you have. All medicines you are taking, including vitamins, herbs, eye drops, creams, and over-the-counter medicines. Any blood disorders you have. Any surgeries you have had. Any medical conditions you have. Whether you are pregnant or may be pregnant. What are the risks? Generally, this is a safe test. However, problems may occur, including an allergic reaction to dye (contrast) that may be used during the test. What happens before the test? No  specific preparation is needed. You may eat and drink normally. What happens during the test?  You will take off your clothes from the waist up and put on a hospital gown. Electrodes or electrocardiogram (ECG)patches may be placed on your chest. The electrodes or patches are then connected to a device that monitors your heart rate and rhythm. You will lie down on a table for an ultrasound exam. A gel will be applied to your chest to help sound waves pass through your skin. A handheld device, called a transducer, will be pressed against your chest and moved over your heart. The transducer produces sound waves that travel to your heart and bounce back (or "echo" back) to the transducer. These sound waves will be captured in real-time and changed into images of your heart that can be viewed on a video monitor. The images will be recorded on a computer and reviewed by your health care provider. You may be asked to change positions or hold your breath for a short time. This makes it easier to get different views or better views of your heart. In some cases, you may receive contrast through an IV in one of your veins. This can improve the quality of the pictures from your heart. The procedure may vary among health care providers and hospitals. What can I expect after the test? You may return to your normal, everyday life, including diet, activities, and medicines, unless your health care provider tells you not to do that. Follow these instructions at home: It is up to you to get the results of your test. Ask your health care provider,  or the department that is doing the test, when your results will be ready. Keep all follow-up visits. This is important. Summary An echocardiogram is a test that uses sound waves (ultrasound) to produce images of the heart. Images from an echocardiogram can provide important information about the size and shape of your heart, heart muscle function, heart valve function, and  other possible heart problems. You do not need to do anything to prepare before this test. You may eat and drink normally. After the echocardiogram is completed, you may return to your normal, everyday life, unless your health care provider tells you not to do that. This information is not intended to replace advice given to you by your health care provider. Make sure you discuss any questions you have with your health care provider. Document Revised: 07/19/2021 Document Reviewed: 06/28/2020 [employer] Patient Education  2023 [employer]       Follow-Up: At [facility], you and your health needs are our priority.  As part of our continuing mission to provide you with exceptional heart care, we have created designated Provider Care Teams.  These Care Teams include your primary Cardiologist (physician) and Advanced Practice Providers (APPs -  Physician Assistants and Nurse Practitioners) who all work together to provide you with the care you need, when you need it.  We recommend signing up for the patient portal called "MyChart".  Sign up information is provided on this After Visit Summary.  MyChart is used to connect with patients for Virtual Visits (Telemedicine).  Patients are able to view lab/test results, encounter notes, upcoming appointments, etc.  Non-urgent messages can be sent to your provider as well.   To learn more about what you can do with MyChart, go to [email or web address].    Your next appointment:   6 month

## 2024-03-19 NOTE — Assessment & Plan Note (Signed)
 Permanent atrial fibrillation. Good rate control. Continue with anticoagulation Xarelto  20 mg once daily. No bleeding concerns at this time.

## 2024-03-20 ENCOUNTER — Other Ambulatory Visit: Payer: Self-pay | Admitting: Cardiology
